# Patient Record
Sex: Female | Born: 1950
Health system: Southern US, Community
[De-identification: ages and names within clinical notes are randomized; demographics above are authoritative.]

## PROBLEM LIST (undated history)

## (undated) DIAGNOSIS — M1990 Unspecified osteoarthritis, unspecified site: Secondary | ICD-10-CM

## (undated) DIAGNOSIS — R112 Nausea with vomiting, unspecified: Secondary | ICD-10-CM

## (undated) DIAGNOSIS — I10 Essential (primary) hypertension: Secondary | ICD-10-CM

## (undated) DIAGNOSIS — E785 Hyperlipidemia, unspecified: Secondary | ICD-10-CM

## (undated) DIAGNOSIS — I071 Rheumatic tricuspid insufficiency: Secondary | ICD-10-CM

## (undated) DIAGNOSIS — N289 Disorder of kidney and ureter, unspecified: Secondary | ICD-10-CM

## (undated) DIAGNOSIS — I351 Nonrheumatic aortic (valve) insufficiency: Secondary | ICD-10-CM

## (undated) DIAGNOSIS — Z9889 Other specified postprocedural states: Secondary | ICD-10-CM

## (undated) DIAGNOSIS — M109 Gout, unspecified: Secondary | ICD-10-CM

## (undated) DIAGNOSIS — R05 Cough: Secondary | ICD-10-CM

## (undated) DIAGNOSIS — E669 Obesity, unspecified: Secondary | ICD-10-CM

## (undated) DIAGNOSIS — I2510 Atherosclerotic heart disease of native coronary artery without angina pectoris: Secondary | ICD-10-CM

## (undated) DIAGNOSIS — R059 Cough, unspecified: Secondary | ICD-10-CM

## (undated) DIAGNOSIS — I350 Nonrheumatic aortic (valve) stenosis: Secondary | ICD-10-CM

## (undated) DIAGNOSIS — N189 Chronic kidney disease, unspecified: Secondary | ICD-10-CM

## (undated) DIAGNOSIS — E119 Type 2 diabetes mellitus without complications: Secondary | ICD-10-CM

## (undated) HISTORY — DX: Hyperlipidemia, unspecified: E78.5

## (undated) HISTORY — PX: BACK SURGERY: SHX140

## (undated) HISTORY — DX: Type 2 diabetes mellitus without complications: E11.9

## (undated) HISTORY — DX: Nonrheumatic aortic (valve) insufficiency: I35.1

## (undated) HISTORY — DX: Rheumatic tricuspid insufficiency: I07.1

## (undated) HISTORY — PX: OTHER SURGICAL HISTORY: SHX169

## (undated) HISTORY — PX: FRACTURE SURGERY: SHX138

## (undated) HISTORY — DX: Essential (primary) hypertension: I10

## (undated) HISTORY — DX: Nonrheumatic aortic (valve) stenosis: I35.0

---

## 2003-03-01 ENCOUNTER — Ambulatory Visit (HOSPITAL_COMMUNITY): Admission: RE | Admit: 2003-03-01 | Discharge: 2003-03-01 | Payer: Self-pay | Admitting: Obstetrics & Gynecology

## 2005-06-29 ENCOUNTER — Ambulatory Visit (HOSPITAL_COMMUNITY): Admission: RE | Admit: 2005-06-29 | Discharge: 2005-06-29 | Payer: Self-pay | Admitting: Otolaryngology

## 2006-12-08 ENCOUNTER — Ambulatory Visit (HOSPITAL_COMMUNITY): Admission: RE | Admit: 2006-12-08 | Discharge: 2006-12-08 | Payer: Self-pay | Admitting: *Deleted

## 2008-01-24 ENCOUNTER — Ambulatory Visit (HOSPITAL_COMMUNITY): Admission: RE | Admit: 2008-01-24 | Discharge: 2008-01-24 | Payer: Self-pay | Admitting: *Deleted

## 2008-01-31 ENCOUNTER — Encounter (INDEPENDENT_AMBULATORY_CARE_PROVIDER_SITE_OTHER): Payer: Self-pay | Admitting: *Deleted

## 2008-01-31 ENCOUNTER — Ambulatory Visit (HOSPITAL_COMMUNITY): Admission: RE | Admit: 2008-01-31 | Discharge: 2008-01-31 | Payer: Self-pay | Admitting: *Deleted

## 2008-03-06 ENCOUNTER — Ambulatory Visit (HOSPITAL_COMMUNITY): Admission: RE | Admit: 2008-03-06 | Discharge: 2008-03-06 | Payer: Self-pay | Admitting: Family Medicine

## 2009-02-26 ENCOUNTER — Ambulatory Visit (HOSPITAL_COMMUNITY): Admission: RE | Admit: 2009-02-26 | Discharge: 2009-02-26 | Payer: Self-pay | Admitting: Internal Medicine

## 2010-02-09 ENCOUNTER — Ambulatory Visit (HOSPITAL_COMMUNITY): Admission: RE | Admit: 2010-02-09 | Discharge: 2010-02-09 | Payer: Self-pay | Admitting: Cardiology

## 2010-02-09 HISTORY — PX: CARDIAC CATHETERIZATION: SHX172

## 2010-02-10 ENCOUNTER — Ambulatory Visit: Payer: Self-pay | Admitting: Surgery

## 2010-02-13 ENCOUNTER — Ambulatory Visit (HOSPITAL_COMMUNITY): Admission: RE | Admit: 2010-02-13 | Discharge: 2010-02-14 | Payer: Self-pay | Admitting: Cardiology

## 2010-02-24 ENCOUNTER — Ambulatory Visit: Payer: Self-pay | Admitting: Surgery

## 2010-03-03 ENCOUNTER — Encounter: Admission: RE | Admit: 2010-03-03 | Discharge: 2010-03-03 | Payer: Self-pay | Admitting: Surgery

## 2010-03-03 ENCOUNTER — Ambulatory Visit: Payer: Self-pay | Admitting: Surgery

## 2010-03-30 ENCOUNTER — Ambulatory Visit: Payer: Self-pay | Admitting: Vascular Surgery

## 2010-04-01 ENCOUNTER — Ambulatory Visit: Payer: Self-pay | Admitting: Surgery

## 2010-04-01 ENCOUNTER — Inpatient Hospital Stay (HOSPITAL_COMMUNITY): Admission: RE | Admit: 2010-04-01 | Discharge: 2010-04-07 | Payer: Self-pay | Admitting: Surgery

## 2010-04-01 ENCOUNTER — Encounter: Payer: Self-pay | Admitting: Surgery

## 2010-04-01 HISTORY — PX: AORTIC VALVE REPLACEMENT: SHX41

## 2010-04-01 HISTORY — PX: OTHER SURGICAL HISTORY: SHX169

## 2010-04-21 ENCOUNTER — Ambulatory Visit: Payer: Self-pay | Admitting: Surgery

## 2010-04-21 ENCOUNTER — Encounter: Admission: RE | Admit: 2010-04-21 | Discharge: 2010-04-21 | Payer: Self-pay | Admitting: Surgery

## 2010-05-04 ENCOUNTER — Encounter (HOSPITAL_COMMUNITY): Admission: RE | Admit: 2010-05-04 | Discharge: 2010-06-03 | Payer: Self-pay | Admitting: Cardiology

## 2010-06-03 ENCOUNTER — Encounter (HOSPITAL_COMMUNITY)
Admission: RE | Admit: 2010-06-03 | Discharge: 2010-07-03 | Payer: Self-pay | Source: Home / Self Care | Admitting: Cardiology

## 2010-06-16 ENCOUNTER — Ambulatory Visit (HOSPITAL_COMMUNITY): Admission: RE | Admit: 2010-06-16 | Discharge: 2010-06-16 | Payer: Self-pay | Admitting: Cardiology

## 2010-07-03 ENCOUNTER — Encounter (HOSPITAL_COMMUNITY)
Admission: RE | Admit: 2010-07-03 | Discharge: 2010-08-02 | Payer: Self-pay | Source: Home / Self Care | Admitting: Cardiology

## 2010-07-07 ENCOUNTER — Ambulatory Visit: Payer: Self-pay | Admitting: Surgery

## 2010-08-03 ENCOUNTER — Encounter (HOSPITAL_COMMUNITY)
Admission: RE | Admit: 2010-08-03 | Discharge: 2010-09-02 | Payer: Self-pay | Source: Home / Self Care | Attending: Cardiology | Admitting: Cardiology

## 2010-09-01 ENCOUNTER — Encounter
Admission: RE | Admit: 2010-09-01 | Discharge: 2010-09-01 | Payer: Self-pay | Source: Home / Self Care | Attending: Surgery | Admitting: Surgery

## 2010-09-01 ENCOUNTER — Ambulatory Visit: Payer: Self-pay | Admitting: Surgery

## 2010-12-05 LAB — BASIC METABOLIC PANEL
BUN: 20 mg/dL (ref 6–23)
BUN: 21 mg/dL (ref 6–23)
BUN: 22 mg/dL (ref 6–23)
BUN: 24 mg/dL — ABNORMAL HIGH (ref 6–23)
CO2: 28 mEq/L (ref 19–32)
CO2: 31 mEq/L (ref 19–32)
CO2: 35 mEq/L — ABNORMAL HIGH (ref 19–32)
CO2: 36 mEq/L — ABNORMAL HIGH (ref 19–32)
Calcium: 8.3 mg/dL — ABNORMAL LOW (ref 8.4–10.5)
Calcium: 8.8 mg/dL (ref 8.4–10.5)
Calcium: 9.7 mg/dL (ref 8.4–10.5)
Calcium: 9.9 mg/dL (ref 8.4–10.5)
Chloride: 101 mEq/L (ref 96–112)
Chloride: 96 mEq/L (ref 96–112)
Chloride: 98 mEq/L (ref 96–112)
Chloride: 98 mEq/L (ref 96–112)
Creatinine, Ser: 1.45 mg/dL — ABNORMAL HIGH (ref 0.4–1.2)
Creatinine, Ser: 1.5 mg/dL — ABNORMAL HIGH (ref 0.4–1.2)
Creatinine, Ser: 1.52 mg/dL — ABNORMAL HIGH (ref 0.4–1.2)
Creatinine, Ser: 1.57 mg/dL — ABNORMAL HIGH (ref 0.4–1.2)
GFR calc Af Amer: 41 mL/min — ABNORMAL LOW (ref >60)
GFR calc Af Amer: 42 mL/min — ABNORMAL LOW (ref >60)
GFR calc Af Amer: 43 mL/min — ABNORMAL LOW (ref >60)
GFR calc Af Amer: 45 mL/min — ABNORMAL LOW (ref >60)
GFR calc non Af Amer: 34 mL/min — ABNORMAL LOW (ref >60)
GFR calc non Af Amer: 35 mL/min — ABNORMAL LOW (ref >60)
GFR calc non Af Amer: 35 mL/min — ABNORMAL LOW (ref >60)
GFR calc non Af Amer: 37 mL/min — ABNORMAL LOW (ref >60)
Glucose, Bld: 147 mg/dL — ABNORMAL HIGH (ref 70–99)
Glucose, Bld: 84 mg/dL (ref 70–99)
Glucose, Bld: 91 mg/dL (ref 70–99)
Glucose, Bld: 99 mg/dL (ref 70–99)
Potassium: 3.6 mEq/L (ref 3.5–5.1)
Potassium: 4.1 mEq/L (ref 3.5–5.1)
Potassium: 4.4 mEq/L (ref 3.5–5.1)
Potassium: 4.8 mEq/L (ref 3.5–5.1)
Sodium: 137 mEq/L (ref 135–145)
Sodium: 139 mEq/L (ref 135–145)
Sodium: 140 mEq/L (ref 135–145)
Sodium: 143 mEq/L (ref 135–145)

## 2010-12-05 LAB — GLUCOSE, CAPILLARY
Glucose-Capillary: 105 mg/dL — ABNORMAL HIGH (ref 70–99)
Glucose-Capillary: 110 mg/dL — ABNORMAL HIGH (ref 70–99)
Glucose-Capillary: 113 mg/dL — ABNORMAL HIGH (ref 70–99)
Glucose-Capillary: 116 mg/dL — ABNORMAL HIGH (ref 70–99)
Glucose-Capillary: 116 mg/dL — ABNORMAL HIGH (ref 70–99)
Glucose-Capillary: 117 mg/dL — ABNORMAL HIGH (ref 70–99)
Glucose-Capillary: 118 mg/dL — ABNORMAL HIGH (ref 70–99)
Glucose-Capillary: 128 mg/dL — ABNORMAL HIGH (ref 70–99)
Glucose-Capillary: 132 mg/dL — ABNORMAL HIGH (ref 70–99)
Glucose-Capillary: 139 mg/dL — ABNORMAL HIGH (ref 70–99)
Glucose-Capillary: 152 mg/dL — ABNORMAL HIGH (ref 70–99)
Glucose-Capillary: 156 mg/dL — ABNORMAL HIGH (ref 70–99)
Glucose-Capillary: 247 mg/dL — ABNORMAL HIGH (ref 70–99)
Glucose-Capillary: 88 mg/dL (ref 70–99)
Glucose-Capillary: 90 mg/dL (ref 70–99)
Glucose-Capillary: 91 mg/dL (ref 70–99)
Glucose-Capillary: 95 mg/dL (ref 70–99)
Glucose-Capillary: 96 mg/dL (ref 70–99)

## 2010-12-05 LAB — PROTIME-INR
INR: 1.14 (ref 0.00–1.49)
INR: 1.25 (ref 0.00–1.49)
INR: 1.28 (ref 0.00–1.49)
INR: 1.41 (ref 0.00–1.49)
INR: 1.62 — ABNORMAL HIGH (ref 0.00–1.49)
Prothrombin Time: 14.5 seconds (ref 11.6–15.2)
Prothrombin Time: 15.6 seconds — ABNORMAL HIGH (ref 11.6–15.2)
Prothrombin Time: 15.9 seconds — ABNORMAL HIGH (ref 11.6–15.2)
Prothrombin Time: 17.1 seconds — ABNORMAL HIGH (ref 11.6–15.2)
Prothrombin Time: 19.1 seconds — ABNORMAL HIGH (ref 11.6–15.2)

## 2010-12-05 LAB — CBC
HCT: 24.6 % — ABNORMAL LOW (ref 36.0–46.0)
HCT: 27.6 % — ABNORMAL LOW (ref 36.0–46.0)
Hemoglobin: 8.2 g/dL — ABNORMAL LOW (ref 12.0–15.0)
Hemoglobin: 9.1 g/dL — ABNORMAL LOW (ref 12.0–15.0)
MCH: 28.9 pg (ref 26.0–34.0)
MCH: 29.1 pg (ref 26.0–34.0)
MCHC: 32.9 g/dL (ref 30.0–36.0)
MCHC: 33.2 g/dL (ref 30.0–36.0)
MCV: 87.6 fL (ref 78.0–100.0)
MCV: 87.7 fL (ref 78.0–100.0)
Platelets: 172 10*3/uL (ref 150–400)
Platelets: 237 10*3/uL (ref 150–400)
RBC: 2.81 MIL/uL — ABNORMAL LOW (ref 3.87–5.11)
RBC: 3.15 MIL/uL — ABNORMAL LOW (ref 3.87–5.11)
RDW: 13.7 % (ref 11.5–15.5)
RDW: 14 % (ref 11.5–15.5)
WBC: 16 10*3/uL — ABNORMAL HIGH (ref 4.0–10.5)
WBC: 18.3 10*3/uL — ABNORMAL HIGH (ref 4.0–10.5)

## 2010-12-06 LAB — POCT I-STAT 3, ART BLOOD GAS (G3+)
Acid-base deficit: 1 mmol/L (ref 0.0–2.0)
Acid-base deficit: 2 mmol/L (ref 0.0–2.0)
Acid-base deficit: 5 mmol/L — ABNORMAL HIGH (ref 0.0–2.0)
Acid-base deficit: 7 mmol/L — ABNORMAL HIGH (ref 0.0–2.0)
Bicarbonate: 19.8 mEq/L — ABNORMAL LOW (ref 20.0–24.0)
Bicarbonate: 20.9 mEq/L (ref 20.0–24.0)
Bicarbonate: 23.2 mEq/L (ref 20.0–24.0)
Bicarbonate: 23.6 mEq/L (ref 20.0–24.0)
O2 Saturation: 100 %
O2 Saturation: 100 %
O2 Saturation: 94 %
O2 Saturation: 94 %
Patient temperature: 35.5
Patient temperature: 36.5
TCO2: 21 mmol/L (ref 0–100)
TCO2: 22 mmol/L (ref 0–100)
TCO2: 24 mmol/L (ref 0–100)
TCO2: 25 mmol/L (ref 0–100)
pCO2 arterial: 35 mmHg (ref 35.0–45.0)
pCO2 arterial: 40.3 mmHg (ref 35.0–45.0)
pCO2 arterial: 40.8 mmHg (ref 35.0–45.0)
pCO2 arterial: 44.3 mmHg (ref 35.0–45.0)
pH, Arterial: 7.287 — ABNORMAL LOW (ref 7.350–7.400)
pH, Arterial: 7.322 — ABNORMAL LOW (ref 7.350–7.400)
pH, Arterial: 7.335 — ABNORMAL LOW (ref 7.350–7.400)
pH, Arterial: 7.428 — ABNORMAL HIGH (ref 7.350–7.400)
pO2, Arterial: 359 mmHg — ABNORMAL HIGH (ref 80.0–100.0)
pO2, Arterial: 431 mmHg — ABNORMAL HIGH (ref 80.0–100.0)
pO2, Arterial: 74 mmHg — ABNORMAL LOW (ref 80.0–100.0)
pO2, Arterial: 76 mmHg — ABNORMAL LOW (ref 80.0–100.0)

## 2010-12-06 LAB — POCT I-STAT 4, (NA,K, GLUC, HGB,HCT)
Glucose, Bld: 128 mg/dL — ABNORMAL HIGH (ref 70–99)
Glucose, Bld: 129 mg/dL — ABNORMAL HIGH (ref 70–99)
Glucose, Bld: 129 mg/dL — ABNORMAL HIGH (ref 70–99)
Glucose, Bld: 138 mg/dL — ABNORMAL HIGH (ref 70–99)
Glucose, Bld: 150 mg/dL — ABNORMAL HIGH (ref 70–99)
Glucose, Bld: 164 mg/dL — ABNORMAL HIGH (ref 70–99)
Glucose, Bld: 171 mg/dL — ABNORMAL HIGH (ref 70–99)
Glucose, Bld: 223 mg/dL — ABNORMAL HIGH (ref 70–99)
HCT: 22 % — ABNORMAL LOW (ref 36.0–46.0)
HCT: 24 % — ABNORMAL LOW (ref 36.0–46.0)
HCT: 24 % — ABNORMAL LOW (ref 36.0–46.0)
HCT: 25 % — ABNORMAL LOW (ref 36.0–46.0)
HCT: 26 % — ABNORMAL LOW (ref 36.0–46.0)
HCT: 29 % — ABNORMAL LOW (ref 36.0–46.0)
HCT: 31 % — ABNORMAL LOW (ref 36.0–46.0)
HCT: 32 % — ABNORMAL LOW (ref 36.0–46.0)
Hemoglobin: 10.5 g/dL — ABNORMAL LOW (ref 12.0–15.0)
Hemoglobin: 10.9 g/dL — ABNORMAL LOW (ref 12.0–15.0)
Hemoglobin: 7.5 g/dL — ABNORMAL LOW (ref 12.0–15.0)
Hemoglobin: 8.2 g/dL — ABNORMAL LOW (ref 12.0–15.0)
Hemoglobin: 8.2 g/dL — ABNORMAL LOW (ref 12.0–15.0)
Hemoglobin: 8.5 g/dL — ABNORMAL LOW (ref 12.0–15.0)
Hemoglobin: 8.8 g/dL — ABNORMAL LOW (ref 12.0–15.0)
Hemoglobin: 9.9 g/dL — ABNORMAL LOW (ref 12.0–15.0)
Potassium: 3.6 mEq/L (ref 3.5–5.1)
Potassium: 3.6 mEq/L (ref 3.5–5.1)
Potassium: 4 mEq/L (ref 3.5–5.1)
Potassium: 4.1 mEq/L (ref 3.5–5.1)
Potassium: 4.5 mEq/L (ref 3.5–5.1)
Potassium: 4.6 mEq/L (ref 3.5–5.1)
Potassium: 4.7 mEq/L (ref 3.5–5.1)
Potassium: 4.8 mEq/L (ref 3.5–5.1)
Sodium: 136 mEq/L (ref 135–145)
Sodium: 137 mEq/L (ref 135–145)
Sodium: 138 mEq/L (ref 135–145)
Sodium: 138 mEq/L (ref 135–145)
Sodium: 139 mEq/L (ref 135–145)
Sodium: 139 mEq/L (ref 135–145)
Sodium: 141 mEq/L (ref 135–145)
Sodium: 142 mEq/L (ref 135–145)

## 2010-12-06 LAB — GLUCOSE, CAPILLARY
Glucose-Capillary: 104 mg/dL — ABNORMAL HIGH (ref 70–99)
Glucose-Capillary: 122 mg/dL — ABNORMAL HIGH (ref 70–99)
Glucose-Capillary: 131 mg/dL — ABNORMAL HIGH (ref 70–99)
Glucose-Capillary: 138 mg/dL — ABNORMAL HIGH (ref 70–99)
Glucose-Capillary: 140 mg/dL — ABNORMAL HIGH (ref 70–99)
Glucose-Capillary: 140 mg/dL — ABNORMAL HIGH (ref 70–99)
Glucose-Capillary: 143 mg/dL — ABNORMAL HIGH (ref 70–99)
Glucose-Capillary: 147 mg/dL — ABNORMAL HIGH (ref 70–99)
Glucose-Capillary: 147 mg/dL — ABNORMAL HIGH (ref 70–99)
Glucose-Capillary: 148 mg/dL — ABNORMAL HIGH (ref 70–99)
Glucose-Capillary: 149 mg/dL — ABNORMAL HIGH (ref 70–99)
Glucose-Capillary: 165 mg/dL — ABNORMAL HIGH (ref 70–99)
Glucose-Capillary: 172 mg/dL — ABNORMAL HIGH (ref 70–99)
Glucose-Capillary: 178 mg/dL — ABNORMAL HIGH (ref 70–99)
Glucose-Capillary: 181 mg/dL — ABNORMAL HIGH (ref 70–99)
Glucose-Capillary: 237 mg/dL — ABNORMAL HIGH (ref 70–99)
Glucose-Capillary: 238 mg/dL — ABNORMAL HIGH (ref 70–99)
Glucose-Capillary: 319 mg/dL — ABNORMAL HIGH (ref 70–99)
Glucose-Capillary: 80 mg/dL (ref 70–99)
Glucose-Capillary: 96 mg/dL (ref 70–99)

## 2010-12-06 LAB — PLATELET COUNT: Platelets: 204 10*3/uL (ref 150–400)

## 2010-12-06 LAB — COMPREHENSIVE METABOLIC PANEL
ALT: 20 U/L (ref 0–35)
AST: 27 U/L (ref 0–37)
Albumin: 3.7 g/dL (ref 3.5–5.2)
Alkaline Phosphatase: 34 U/L — ABNORMAL LOW (ref 39–117)
BUN: 21 mg/dL (ref 6–23)
CO2: 22 mEq/L (ref 19–32)
Calcium: 10.4 mg/dL (ref 8.4–10.5)
Chloride: 106 mEq/L (ref 96–112)
Creatinine, Ser: 1.36 mg/dL — ABNORMAL HIGH (ref 0.4–1.2)
GFR calc Af Amer: 48 mL/min — ABNORMAL LOW (ref >60)
GFR calc non Af Amer: 40 mL/min — ABNORMAL LOW (ref >60)
Glucose, Bld: 134 mg/dL — ABNORMAL HIGH (ref 70–99)
Potassium: 4.9 mEq/L (ref 3.5–5.1)
Sodium: 138 mEq/L (ref 135–145)
Total Bilirubin: 0.4 mg/dL (ref 0.3–1.2)
Total Protein: 6.9 g/dL (ref 6.0–8.3)

## 2010-12-06 LAB — CBC
HCT: 25.2 % — ABNORMAL LOW (ref 36.0–46.0)
HCT: 26.5 % — ABNORMAL LOW (ref 36.0–46.0)
HCT: 27 % — ABNORMAL LOW (ref 36.0–46.0)
HCT: 29.6 % — ABNORMAL LOW (ref 36.0–46.0)
HCT: 37.2 % (ref 36.0–46.0)
Hemoglobin: 12.5 g/dL (ref 12.0–15.0)
Hemoglobin: 8.4 g/dL — ABNORMAL LOW (ref 12.0–15.0)
Hemoglobin: 8.7 g/dL — ABNORMAL LOW (ref 12.0–15.0)
Hemoglobin: 9 g/dL — ABNORMAL LOW (ref 12.0–15.0)
Hemoglobin: 9.7 g/dL — ABNORMAL LOW (ref 12.0–15.0)
MCH: 28.6 pg (ref 26.0–34.0)
MCH: 28.7 pg (ref 26.0–34.0)
MCH: 28.8 pg (ref 26.0–34.0)
MCH: 29 pg (ref 26.0–34.0)
MCH: 29 pg (ref 26.0–34.0)
MCHC: 32.9 g/dL (ref 30.0–36.0)
MCHC: 32.9 g/dL (ref 30.0–36.0)
MCHC: 33.1 g/dL (ref 30.0–36.0)
MCHC: 33.5 g/dL (ref 30.0–36.0)
MCHC: 33.5 g/dL (ref 30.0–36.0)
MCV: 86.4 fL (ref 78.0–100.0)
MCV: 86.5 fL (ref 78.0–100.0)
MCV: 86.9 fL (ref 78.0–100.0)
MCV: 87 fL (ref 78.0–100.0)
MCV: 87.3 fL (ref 78.0–100.0)
Platelets: 173 10*3/uL (ref 150–400)
Platelets: 188 10*3/uL (ref 150–400)
Platelets: 211 10*3/uL (ref 150–400)
Platelets: 241 10*3/uL (ref 150–400)
Platelets: 316 10*3/uL (ref 150–400)
RBC: 2.9 MIL/uL — ABNORMAL LOW (ref 3.87–5.11)
RBC: 3.05 MIL/uL — ABNORMAL LOW (ref 3.87–5.11)
RBC: 3.12 MIL/uL — ABNORMAL LOW (ref 3.87–5.11)
RBC: 3.39 MIL/uL — ABNORMAL LOW (ref 3.87–5.11)
RBC: 4.3 MIL/uL (ref 3.87–5.11)
RDW: 13.5 % (ref 11.5–15.5)
RDW: 13.8 % (ref 11.5–15.5)
RDW: 13.8 % (ref 11.5–15.5)
RDW: 13.9 % (ref 11.5–15.5)
RDW: 13.9 % (ref 11.5–15.5)
WBC: 11.2 10*3/uL — ABNORMAL HIGH (ref 4.0–10.5)
WBC: 16 10*3/uL — ABNORMAL HIGH (ref 4.0–10.5)
WBC: 17.3 10*3/uL — ABNORMAL HIGH (ref 4.0–10.5)
WBC: 18.5 10*3/uL — ABNORMAL HIGH (ref 4.0–10.5)
WBC: 9.9 10*3/uL (ref 4.0–10.5)

## 2010-12-06 LAB — BASIC METABOLIC PANEL
BUN: 18 mg/dL (ref 6–23)
CO2: 24 mEq/L (ref 19–32)
Calcium: 8 mg/dL — ABNORMAL LOW (ref 8.4–10.5)
Chloride: 107 mEq/L (ref 96–112)
Creatinine, Ser: 1.37 mg/dL — ABNORMAL HIGH (ref 0.4–1.2)
GFR calc Af Amer: 48 mL/min — ABNORMAL LOW (ref >60)
GFR calc non Af Amer: 39 mL/min — ABNORMAL LOW (ref >60)
Glucose, Bld: 130 mg/dL — ABNORMAL HIGH (ref 70–99)
Potassium: 4.7 mEq/L (ref 3.5–5.1)
Sodium: 140 mEq/L (ref 135–145)

## 2010-12-06 LAB — HEMOGLOBIN AND HEMATOCRIT, BLOOD
HCT: 23.7 % — ABNORMAL LOW (ref 36.0–46.0)
Hemoglobin: 8.1 g/dL — ABNORMAL LOW (ref 12.0–15.0)

## 2010-12-06 LAB — PROTIME-INR
INR: 0.97 (ref 0.00–1.49)
INR: 1.39 (ref 0.00–1.49)
Prothrombin Time: 12.8 seconds (ref 11.6–15.2)
Prothrombin Time: 16.9 seconds — ABNORMAL HIGH (ref 11.6–15.2)

## 2010-12-06 LAB — URINE MICROSCOPIC-ADD ON

## 2010-12-06 LAB — POCT I-STAT, CHEM 8
BUN: 18 mg/dL (ref 6–23)
BUN: 22 mg/dL (ref 6–23)
Calcium, Ion: 1.11 mmol/L — ABNORMAL LOW (ref 1.12–1.32)
Calcium, Ion: 1.18 mmol/L (ref 1.12–1.32)
Chloride: 101 mEq/L (ref 96–112)
Chloride: 111 mEq/L (ref 96–112)
Creatinine, Ser: 1.3 mg/dL — ABNORMAL HIGH (ref 0.4–1.2)
Creatinine, Ser: 1.5 mg/dL — ABNORMAL HIGH (ref 0.4–1.2)
Glucose, Bld: 146 mg/dL — ABNORMAL HIGH (ref 70–99)
Glucose, Bld: 261 mg/dL — ABNORMAL HIGH (ref 70–99)
HCT: 26 % — ABNORMAL LOW (ref 36.0–46.0)
HCT: 28 % — ABNORMAL LOW (ref 36.0–46.0)
Hemoglobin: 8.8 g/dL — ABNORMAL LOW (ref 12.0–15.0)
Hemoglobin: 9.5 g/dL — ABNORMAL LOW (ref 12.0–15.0)
Potassium: 3.3 mEq/L — ABNORMAL LOW (ref 3.5–5.1)
Potassium: 4.4 mEq/L (ref 3.5–5.1)
Sodium: 136 mEq/L (ref 135–145)
Sodium: 140 mEq/L (ref 135–145)
TCO2: 20 mmol/L (ref 0–100)
TCO2: 24 mmol/L (ref 0–100)

## 2010-12-06 LAB — URINALYSIS, ROUTINE W REFLEX MICROSCOPIC
Bilirubin Urine: NEGATIVE
Glucose, UA: NEGATIVE mg/dL
Hgb urine dipstick: NEGATIVE
Ketones, ur: NEGATIVE mg/dL
Nitrite: NEGATIVE
Protein, ur: NEGATIVE mg/dL
Specific Gravity, Urine: 1.011 (ref 1.005–1.030)
Urobilinogen, UA: 0.2 mg/dL (ref 0.0–1.0)
pH: 6.5 (ref 5.0–8.0)

## 2010-12-06 LAB — MAGNESIUM
Magnesium: 2.3 mg/dL (ref 1.5–2.5)
Magnesium: 2.4 mg/dL (ref 1.5–2.5)
Magnesium: 2.8 mg/dL — ABNORMAL HIGH (ref 1.5–2.5)

## 2010-12-06 LAB — BLOOD GAS, ARTERIAL
Acid-base deficit: 0.8 mmol/L (ref 0.0–2.0)
Bicarbonate: 23.1 mEq/L (ref 20.0–24.0)
Drawn by: 181601
FIO2: 0.21 %
O2 Saturation: 99.5 %
Patient temperature: 98.6
TCO2: 24.2 mmol/L (ref 0–100)
pCO2 arterial: 36.5 mmHg (ref 35.0–45.0)
pH, Arterial: 7.417 — ABNORMAL HIGH (ref 7.350–7.400)
pO2, Arterial: 102 mmHg — ABNORMAL HIGH (ref 80.0–100.0)

## 2010-12-06 LAB — TYPE AND SCREEN
ABO/RH(D): A POS
Antibody Screen: NEGATIVE

## 2010-12-06 LAB — SURGICAL PCR SCREEN
MRSA, PCR: NEGATIVE
Staphylococcus aureus: NEGATIVE

## 2010-12-06 LAB — CREATININE, SERUM
Creatinine, Ser: 1.46 mg/dL — ABNORMAL HIGH (ref 0.4–1.2)
Creatinine, Ser: 1.57 mg/dL — ABNORMAL HIGH (ref 0.4–1.2)
GFR calc Af Amer: 41 mL/min — ABNORMAL LOW (ref >60)
GFR calc Af Amer: 44 mL/min — ABNORMAL LOW (ref >60)
GFR calc non Af Amer: 34 mL/min — ABNORMAL LOW (ref >60)
GFR calc non Af Amer: 37 mL/min — ABNORMAL LOW (ref >60)

## 2010-12-06 LAB — ABO/RH: ABO/RH(D): A POS

## 2010-12-06 LAB — HEMOGLOBIN A1C
Hgb A1c MFr Bld: 6.5 % — ABNORMAL HIGH (ref <5.7)
Mean Plasma Glucose: 140 mg/dL — ABNORMAL HIGH (ref <117)

## 2010-12-06 LAB — APTT
aPTT: 28 seconds (ref 24–37)
aPTT: 29 seconds (ref 24–37)

## 2010-12-07 LAB — CBC
HCT: 27.2 % — ABNORMAL LOW (ref 36.0–46.0)
Hemoglobin: 8.9 g/dL — ABNORMAL LOW (ref 12.0–15.0)
MCHC: 32.9 g/dL (ref 30.0–36.0)
MCV: 90.7 fL (ref 78.0–100.0)
Platelets: 323 10*3/uL (ref 150–400)
RBC: 2.99 MIL/uL — ABNORMAL LOW (ref 3.87–5.11)
RDW: 13.4 % (ref 11.5–15.5)
WBC: 7.7 10*3/uL (ref 4.0–10.5)

## 2010-12-07 LAB — POCT I-STAT 3, ART BLOOD GAS (G3+)
Acid-base deficit: 2 mmol/L (ref 0.0–2.0)
Bicarbonate: 22.4 mEq/L (ref 20.0–24.0)
O2 Saturation: 95 %
TCO2: 24 mmol/L (ref 0–100)
pCO2 arterial: 38.2 mmHg (ref 35.0–45.0)
pH, Arterial: 7.377 (ref 7.350–7.400)
pO2, Arterial: 77 mmHg — ABNORMAL LOW (ref 80.0–100.0)

## 2010-12-07 LAB — POCT I-STAT 3, VENOUS BLOOD GAS (G3P V)
Acid-base deficit: 4 mmol/L — ABNORMAL HIGH (ref 0.0–2.0)
Bicarbonate: 21.8 mEq/L (ref 20.0–24.0)
O2 Saturation: 62 %
TCO2: 23 mmol/L (ref 0–100)
pCO2, Ven: 41.1 mmHg — ABNORMAL LOW (ref 45.0–50.0)
pH, Ven: 7.333 — ABNORMAL HIGH (ref 7.250–7.300)
pO2, Ven: 34 mmHg (ref 30.0–45.0)

## 2010-12-07 LAB — BASIC METABOLIC PANEL
BUN: 18 mg/dL (ref 6–23)
CO2: 23 mEq/L (ref 19–32)
Calcium: 9.3 mg/dL (ref 8.4–10.5)
Chloride: 106 mEq/L (ref 96–112)
Creatinine, Ser: 1.27 mg/dL — ABNORMAL HIGH (ref 0.4–1.2)
GFR calc Af Amer: 52 mL/min — ABNORMAL LOW (ref >60)
GFR calc non Af Amer: 43 mL/min — ABNORMAL LOW (ref >60)
Glucose, Bld: 115 mg/dL — ABNORMAL HIGH (ref 70–99)
Potassium: 3.9 mEq/L (ref 3.5–5.1)
Sodium: 138 mEq/L (ref 135–145)

## 2010-12-07 LAB — GLUCOSE, CAPILLARY
Glucose-Capillary: 121 mg/dL — ABNORMAL HIGH (ref 70–99)
Glucose-Capillary: 127 mg/dL — ABNORMAL HIGH (ref 70–99)
Glucose-Capillary: 133 mg/dL — ABNORMAL HIGH (ref 70–99)
Glucose-Capillary: 141 mg/dL — ABNORMAL HIGH (ref 70–99)
Glucose-Capillary: 162 mg/dL — ABNORMAL HIGH (ref 70–99)
Glucose-Capillary: 298 mg/dL — ABNORMAL HIGH (ref 70–99)

## 2011-01-21 ENCOUNTER — Other Ambulatory Visit: Payer: Self-pay | Admitting: Surgery

## 2011-01-21 DIAGNOSIS — R911 Solitary pulmonary nodule: Secondary | ICD-10-CM

## 2011-02-02 NOTE — Assessment & Plan Note (Signed)
OFFICE VISIT   [name], [name]  DOB:  03/25/1951                                        September 01, 2010  CHART #:  [id number]   HISTORY:  The patient returned to my office today for followup of a 9-mm  right upper lobe lung nodule that was initially diagnosed on  preoperative CT scan.  She had undergone aortic valve replacement with a  St. Jude mechanical valve and replacement of an ascending aortic  aneurysm with a supra coronary tube graft on April 01, 2010.  A  preoperative CT scan of the chest was performed to assess the size of  her aorta and this has shown the 9-mm right upper lobe lung nodule.  Her  postoperative course from a cardiac surgery standpoint has been a little  slow, but she has gradually improved.  Her only complaints at this time  are some paresthesias around the incision probably related to keloid  formation in the upper portion of the incision.  She said that this is  gradually improving.  She denies any shortness of breath and has been  walking well.  She is using some local remedies for her keloid.   PHYSICAL EXAMINATION:  Today, her blood pressure 123/74, pulse is 72 and  regular, and respiratory rate is 18, unlabored.  Oxygen saturation on  room air is 98%.  She looks well.  Cardiac exam shows regular rate and  rhythm with crisp mechanical valve sounds.  Lung exam is clear.  The  chest incision is well healed.  The upper portion of the incision has  some keloid formation, but the mid and lower portions have a very fine  scar.  There is no erythema.  The sternum feels stable.  There is no  peripheral edema.   DIAGNOSTIC TESTS:  A CT scan of the chest was done today and shows a  stable 9-mm right upper lobe lung nodule bordering the major fissure.  The radiologist also commented on a few other small nodules which were  not commented on on the previous CT scan.  This probably shows a stable  3-mm nodule in the right lung  apex as well as a stable 2.5-mm nodule in  the anterior lingula and a stable 5-mm nodule in the left posterior  sulcus.   IMPRESSION:  The patient has a stable 9-mm right upper lobe lung nodule  as well as a few other smaller stable nodules.  I suspect these are  probably benign, but we will see her back in 6 months with a repeat CT  scan of the chest to be sure there has been no change.  She will  continue to follow up with [facility] for her cardiology  care.  She said that she recently was signed to a new cardiologist after  her cardiologist left the practice, but she did not remember what his  name was.  I will plan to see her back in 6 months with a CT scan of the  chest.   Gilford Raid, M.D.  Electronically Signed   BB/MEDQ  D:  09/01/2010  T:  09/02/2010  Job:  [id number]   cc:   [facility]

## 2011-02-02 NOTE — Assessment & Plan Note (Signed)
OFFICE VISIT   [name], [name]  DOB:  09/01/1951                                        April 21, 2010  CHART #:  [id number]   HISTORY:  The patient returned to my office today for followup status  post aortic valve replacement with a 21-mm St. Jude [employer] mechanical  valve and replacement of an ascending aortic aneurysm using a 28-mm  supracoronary tube graft.  [name] said that [name] feels like [name] is improving  overall and is walking daily without chest pain or shortness of breath.  [name] has mild chest wall soreness.  [name] does report some numbness and  tingling in the right fourth and fifth fingers since surgery, but it  seems to be improving.  [name] also notes 2 episodes of brief visual  disturbance occurring over the past week.  [name] said [name] was watching TV  and her computer screen and all of a sudden  noticed that [name] could no  longer focus on the screens and see them clearly.  [name] did not notice  any headache.  [name] had no peripheral motor weakness or numbness at this  time.  [name] had no dysarthria.  [name] said the symptoms have resolved after  a few minutes.   [name] is on Coumadin for her mechanical valve and has been  having her INR followed up by [facility].  [name] said that  her last level was 2.4 and [name] was having the dose increased further.   PHYSICAL EXAMINATION:  Vital Signs:  Today, her blood pressure is  112/67, pulse is 87 and regular, respiratory rate is 18, unlabored.  Oxygen saturation on room air is 98%.  [name] still is a little tired but  better than [name] did preoperatively.  Cardiac:  Regular rate and rhythm  with crisp mechanical valve click.  There is no murmur.  Lungs:  Clear.  Chest:  Incision is healing well and sternum is stable.  There is  moderate bilateral ankle and pedal edema, which [name] said it has  decreased from where it was.   Followup chest x-ray today shows clear lung fields and no pleural  effusions.   There is cardiomegaly.   MEDICATIONS:  Metoprolol 25 mg b.i.d., Crestor 20 mg at bedtime,  zolpidem 10 mg at bedtime, TriCor 145 mg daily, vitamin B complex daily,  multivitamin daily, aspirin 81 mg daily, glipizide 5 mg daily, Coumadin  as directed, iron 150 mg b.i.d., Xanax 0.5 mg b.i.d., Lasix 20 mg daily,  stool softener daily, and hydrocodone p.r.n. for pain.   IMPRESSION:  Overall, the patient seems to be making fairly good  recovery following her surgery.  His 2 episodes of visual changes do  concern me, although, I am not sure what the etiology is.  I am always  concerned about possibility of some embolic phenomenon.  Her INR has  been within the therapeutic range.  Her Coumadin dose has actually  increased recently.  I will continue to watch the symptoms closely and  if they recur anymore, then [name] may need echocardiogram to further  evaluate her valve and to rule out intracardiac thrombus.  [name] is  planning on participating in cardiac rehab and feels like [name] is ready  to start.  I told her to avoid lifting anything heavier than 10 pounds  for a total of 3 months from date of surgery.  [name] does not drive so  that it will not be an issue.  [name] still has some lower extremity edema,  and I answered to increase her Lasix to 40 mg per day see if this  decreases the edema.  [name] will continue to follow up with Dr. Felton Clinton  for her cardiology care and will contact me if [name] develops any problems  with her incisions or any further visual disturbance.   Gilford Raid, M.D.  Electronically Signed   BB/MEDQ  D:  04/21/2010  T:  04/22/2010  Job:  [id number]   cc:   Quincy Carnes, MD  Sherrilee Gilles. Gerarda Fraction, MD

## 2011-02-02 NOTE — Consult Note (Signed)
NEW PATIENT CONSULTATION   REDIA, [name]  DOB:  Feb 20, 1951                                        Feb 12, 2010  CHART #:  [id number]   REASON FOR CONSULTATION:  Severe aortic stenosis.   CLINICAL HISTORY:  I asked to Dr. Felton Clinton to evaluate the patient for  consideration of aortic valve replacement.  She is a 60 year old woman  with a history of severe aortic stenosis and moderate aortic  insufficiency as well as moderate tricuspid regurgitation, last followed  up by echocardiogram in June 2010.  Her mean gradient at that time was  44, the peak gradient was 74.  She had preserved left ventricular  function and normal right ventricular systolic pressure estimated at 23  mmHg.  She was seen in January 2011 and denied any symptoms.  Discussion  was held with her about the need for aortic valve replacement.  She was  counseled to contact Cardiology immediately if she develops any symptoms  of aortic stenosis.  She was seen again on Feb 02, 2010, reporting  increased shortness of breath for about 2 weeks.  She said this came on  fairly quickly and has been associated with marked fatigue and some  episodes of lightheadedness.  Her aortic valve area was 0.66 sq. cm by  echocardiogram in June 2010.   REVIEW OF SYSTEMS:  Her review of systems is as follows.  GENERAL:  She denies any fever or chills.  She has had no recent weight  changes.  She does report marked fatigue over the past few weeks.  EYES:  Negative.  ENT:  She has seen a dentist regularly and was referred to an  endodontist, Dr. Tarri Glenn where she underwent a root canal on the tooth.  She said she has had persistent pain in that tooth since the root canal  and was told that she may have persistent infection that may require  further treatment.  She has not been back to see the endodontist yet.  She does report continued pain from this tooth.  ENDOCRINE:  She denies hypothyroidism.  She does have  history of  diabetes.  CARDIOVASCULAR:  She denies any chest pain or pressure.  She has had  exertional dyspnea as well as orthopnea.  She denies PND.  She has had  no palpitations.  She has had some peripheral edema in her ankles.  RESPIRATORY:  She denies cough and sputum production.  GI:  She has had no nausea or vomiting.  She denies melena and bright  red blood per rectum.  GU:  She denies dysuria and hematuria.  MUSCULOSKELETAL:  She denies arthralgias and myalgias.  NEUROLOGICAL:  She denies any focal weakness or numbness.  She has had  some dizziness, but no syncope.  She never had TIA or stroke.  PSYCHIATRIC:  Negative.  HEMATOLOGICAL:  Negative.  She has no history of bleeding disorders or  easy bleeding.   ALLERGIES:  None.   PAST MEDICAL HISTORY:  Significant for diabetes since age 62.  She has a  history of hypertension and hyperlipidemia.   MEDICATIONS:  1. Zolpidem 10 mg nightly.  2. Metformin 500 mg b.i.d.  3. Lopressor 25 mg b.i.d.  4. Triamterene/HCTZ 75/50 daily.  5. Lisinopril 10 mg daily.  6. TriCor 145 mg daily.  7. Crestor 20 mg  at bedtime.  8. Aspirin 81 mg daily.  9. Iron 27 mg daily.  10.Multivitamin daily.  11.Vitamin super B-complex once a day.  12.Aller-Tec 10 mg p.r.n. daily.   FAMILY HISTORY:  Positive for cardiac disease.  Her father had heart  attack in his late 33s.  Brother had bypass surgery at age 62.  There is  no history of aortic stenosis.   SOCIAL HISTORY:  She is married and works as a Designer, jewellery.  She is a nonsmoker and denies alcohol use.   PHYSICAL EXAMINATION:  Vital Signs:  Blood pressure is 146/73, pulse 75  and regular, and respiratory rate is 18, unlabored.  General:  She is an  obese white female, in no distress.  HEENT:  Normocephalic and  atraumatic.  Pupils are equal and reactive to light and accommodation.  Extraocular muscles are intact.  Her throat is clear.  Neck:  Normal  carotid pulses  bilaterally.  There are no bruits.  There is no  adenopathy or thyromegaly.  Cardiac:  Regular rate and rhythm with  normal S1 and a soft S2.  There is a grade 3/6 systolic murmur along the  left sternal border.  Lungs:  Clear.  Abdomen:  Active bowel sounds.  Abdomen is soft, obese, and nontender.  There are no palpable masses or  organomegaly.  Extremities:  No peripheral edema.  Pedal pulses are  palpable bilaterally.  Skin:  Warm and dry.  Neurologic:  Alert and  oriented x3.  Motor and sensory exams grossly normal.   IMPRESSION:  The patient has severe aortic stenosis and moderate aortic  insufficiency by previous echocardiogram.  She reports being  asymptomatic until the past few weeks when she developed sudden onset of  shortness of breath and fatigue.  I agree that it is time to proceed  with aortic valve replacement.  She is scheduled for cardiac  catheterization in the next week or so to evaluate her coronary  circulation.  She also has a tooth that is still causing her pain after  root canal and may well be involved in persistent infection.  I think  she will need to be seen by her endodontist again immediately to  evaluate this and treat any infection prior to proceeding with valve  replacement.  I discussed the importance of this with her and  possibility that she could develop prosthetic valve endocarditis  postoperatively if she does not have taking care of.  She will make that  appointment immediately.  I will plan to see her back in the office  after she has her cardiac catheterization to discuss the results with  her and make surgical plans.   Gilford Raid, M.D.  Electronically Signed   BB/MEDQ  D:  02/12/2010  T:  02/13/2010  Job:  [id number]   cc:   Quincy Carnes, MD  Sherrilee Gilles. Gerarda Fraction, MD

## 2011-02-02 NOTE — Op Note (Signed)
NAME:  [name], [name]         ACCOUNT NO.:  [id number]   MEDICAL RECORD NO.:  [id number]          PATIENT TYPE:  AMB   LOCATION:  ENDO                         FACILITY:  [facility]   PHYSICIAN:  Leslye Peer, MD       DATE OF BIRTH:  [birth year]   DATE OF PROCEDURE:  01/31/2008  DATE OF DISCHARGE:                               OPERATIVE REPORT   PROCEDURE:  Transesophageal echocardiogram.   INDICATIONS:  Aortic stenosis.   OPERATOR:  Leslye Peer, MD   COMPLICATIONS:  None immediate.   PROCEDURE:  The patient was sedated with 5 mg of Versed IV and 25 mcg of  fentanyl IV and intubated without difficulty.   RESULTS:  1. Left atrium is normal in size.  2. Left atrial appendage is also normal in size without clot or      thrombus noted.  She had normal atrial velocities in the atrial      appendage.  3. Right atrium is normal in size.  4. Right ventricle is normal in size and function.  5. Left ventricle is normal in size and function without regional wall      motion abnormality noted.  6. Mitral valve contractility is normal but has trivial to mild mitral      regurgitation.  7. Tricuspid valve essentially normal with trace to mild tricuspid      regurgitation.  8. Pulmonic valve was not well seen.  9. All pulmonary arteries are normal in size.  10.Aortic valve was heavily calcified but probably trileaflet.  There      was severe reduction in leaflet excursion and opening.  11.Aortic valve area on last telemetry was found to be 1.13 cm.  12.__________ consistent with moderate-to-severe __________  13.Aortic root was normal in size while there was mild dilatation in      ascending aorta consistent with post dilatation.  14.The distal ascending aortic arch and descending aorta wall are      normal in size __________  noted.           ______________________________  Leslye Peer, MD     AB/MEDQ  D:  01/31/2008  T:  02/01/2008  Job:  [id number]

## 2011-02-02 NOTE — Assessment & Plan Note (Signed)
OFFICE VISIT   [name], [name]  DOB:  1951-09-19                                        March 04, 2010  CHART #:  [id number]   HISTORY:  The patient returns to my office today for follow up of her  severe aortic stenosis and moderate aortic insufficiency.  She was sent  for a CT scan of the chest to evaluate her ascending aorta since that  appeared to dilate some at the level of the sinotubular junction on her  cardiac catheterization.  An aortogram was not done at time of her  catheterization.  CT scan of the chest shows that the ascending aorta is  dilated with a maximum diameter of 4.2 cm.  The ascending aorta measures  about 3.9 cm above the level of the aortic valve.  The aorta does narrow  back down to about 25-28 mm at the level of the proximal aortic arch and  the descending aorta measures about 25 mm.  There is heavy calcification  of the aortic valve.  There is also a 9-mm noncalcified nodule in the  oblique fissure on the right.   PHYSICAL EXAMINATION:  Today, her blood pressure is 125/72, pulse 80 and  regular, respiratory rate 16 and unlabored.  She looks well.  Cardiac  exam shows regular rate and rhythm with a grade 3/6 systolic murmur of  aortic stenosis.  There is a 1/6 diastolic murmur.  Lungs are clear.   IMPRESSION:  The patient does have some aneurysmal dilatation of her  ascending aorta beginning just above her aortic valve and extending up  to the distal ascending aorta.  Given the fact that she is only 60 years  old with no coronary disease, I think it would be best to replace the  ascending aorta at the same time as aortic valve replacement.  This may  require Bentall procedure.  It may be possible to place a supracoronary  aortic graft depending on what this area looks like.  I think that if we  left her aorta alone there is a significant possibility that she may  have progressive dilatation and require further surgery for  this  aneurysm in the future which would be more complicated.  She also has  this 9-mm noncalcified nodule that we may be able to visualize and  resect at the time of her surgery.  I discussed the operative procedure  of aortic valve replacement and replacement of her ascending aorta using  a mechanical valve conduit.  We discussed the pros and cons of  mechanical and tissue valves, and I have recommended a mechanical valve  given her age.  She is inclined to agree with that and has no  contraindication to anticoagulation.  She understands the need for  lifelong anticoagulation and feels that she can be compliant with that.  We discussed the benefits and risks of surgery including but not limited  to bleeding, blood transfusion, infection, stroke, myocardial  infarction, heart block requiring permanent pacemaker, coronary  anastomotic stenosis, and death.  She understands all this and agrees to  proceed.  We will tentatively plan to do surgery in the middle of July  which is when she would like to schedule it.   Gilford Raid, M.D.  Electronically Signed   BB/MEDQ  D:  03/04/2010  T:  03/05/2010  Job:  [id number]

## 2011-02-02 NOTE — Assessment & Plan Note (Signed)
OFFICE VISIT   [name], [name]  DOB:  May 01, 1951                                        July 07, 2010  CHART #:  [id number]   HISTORY:  The patient returned to my office today so that I could  reexamine her chest incision due to some complaints of tenderness and  tightness along the incision.  This started about 3 weeks ago.  I last  saw her on April 21, 2010, at which time she was doing well.  She said  that she feels like the upper third of her sternotomy incision is  extremely tender and she cannot stand even her cloths touching it.  She  said it feels like sandpaper rubbing over her skin.  She also said that  feels tight and she has difficulty extending her neck and lifting her  chin up because it pulls on her incision and makes it hurt.   PHYSICAL EXAMINATION:  Vital Signs:  Her blood pressure is 119/77, pulse  81 and regular, respiratory rate is 18 unlabored, oxygen saturation on  room air is 96%.  She looks down in the dumps as she usually does.  She  is holding her chin down towards her chest.  The chest incision is  healing well.  There is some thickening of a scar in the upper third.  There are no signs of infection or inflammation.  There is mild  tenderness to palpation of the incision.  Cardiac:  Regular rate and  rhythm with a crisp mechanical valve click.  There is no murmur.  Lungs:  Clear.  There is no peripheral edema.   IMPRESSION:  The patient is doing well overall.  She does have some  hypersensitivity of her chest incision and I suspect this will gradually  improve over the time.  I encouraged her to try to keep her and neck  straight and her shoulders back as well as her chin up to try to stretch  out the chest incision scar.  I am afraid that if she keeps her head  flex down towards her chest as if she is going to end up with a  contraction of the scar and more discomfort.  I reassured her that this  should gradually  resolve over the time.  I told her she could use some  vitamin E oil or Mederma  on the scar if she would like.  She will continue to follow up with  [facility] and will contact me if she develops any further  problems with her incision.   Gilford Raid, M.D.  Electronically Signed   BB/MEDQ  D:  07/07/2010  T:  07/08/2010  Job:  [id number]

## 2011-02-02 NOTE — Assessment & Plan Note (Signed)
OFFICE VISIT   Laura, Costa  DOB:  02-Aug-1951                                        February 24, 2010  CHART #:  [id number]   HISTORY:  The patient returned to my office today for followup after a  cardiac catheterization on Feb 09, 2010.  I saw her for an initial new  patient consultation on Feb 10, 2010.  She has severe aortic stenosis  and moderate aortic insufficiency.  Her cardiac catheterization on Feb 09, 2010, showed normal left ventricular function.  There was mild  nonobstructive coronary disease with 20% stenosis in an obtuse marginal  branch.  The aortic valve area was calculated 0.68 sq. cm.  The mean  gradient was 62 with a peak to peak gradient of 72.  Cardiac index was  3.19.  Her right heart pressures were slightly elevated with a PA of  43/8 and a wedge pressure of 12.  Right atrial pressure was 11.  Left  ventriculogram suggested that there may be some enlargement of her  ascending aorta.  The left ventriculogram only really showed up to the  level of the sinotubular junction and then just above this.  The aorta  may have started to enlarge.   PHYSICAL EXAMINATION:  Today, her blood pressure is 139/74, pulse is 85  and regular, and respiratory rate is 18, unlabored.  Oxygen saturation  on room air is 97%.  She looks well.  Cardiac exam shows a regular rate  and rhythm with a normal S1 and a soft S2.  There is grade 3/6 systolic  murmur of aortic stenosis.  There may be a slight aortic insufficiency  murmur.  Her lungs are clear.  There is no peripheral edema.   IMPRESSION:  The patient has severe aortic stenosis and moderate aortic  insufficiency with nonobstructive coronary disease.  There is some  question whether she has some dilatation of her ascending aorta above  the level of the sinotubular junction.  This area was not seen on  ventriculography and a specific aortic root injection was not done.  Therefore, we will obtain a  CT scan of the chest without contrast to  evaluate this area since her creatinine was 1.3.  This should tell us  whether the ascending aorta is of normal size or not.  She has gone back  to see Dr. Tarri Glenn for followup of her tooth pain after previous root  canal.  She said that she did have to undergo another procedure to  debride the root of one of her teeth that was intruding on the maxillary  sinus and felt to be causing her pain.  He did not feel there is any  other source of infection.  She said that she does need to go back to  have a temporary filling converted to a permanent filling in the near  future.  She said she is feeling fairly well overall since she is not  exerting herself.  She would like to wait until after March 23, 2010, to  undergo surgery and I told her I think that is okay as long as she is  not exerting herself and her symptoms remained stable.  That will allow  plenty of time for her mouth to heal before proceeding with valve  replacement.  I will see her  back in the office after she has her CT  scan of the chest to discuss results with her and schedule surgery.   Gilford Raid, M.D.  Electronically Signed   BB/MEDQ  D:  02/24/2010  T:  02/25/2010  Job:  [id number]   cc:   Quincy Carnes, MD

## 2011-02-17 ENCOUNTER — Other Ambulatory Visit: Payer: Self-pay | Admitting: Cardiovascular Disease

## 2011-03-09 ENCOUNTER — Ambulatory Visit
Admission: RE | Admit: 2011-03-09 | Discharge: 2011-03-09 | Disposition: A | Discharge status: Home / Self Care | Payer: 59 | Source: Ambulatory Visit | Attending: Surgery | Admitting: Surgery

## 2011-03-09 ENCOUNTER — Ambulatory Visit (INDEPENDENT_AMBULATORY_CARE_PROVIDER_SITE_OTHER): Payer: 59 | Admitting: Surgery

## 2011-03-09 DIAGNOSIS — D381 Neoplasm of uncertain behavior of trachea, bronchus and lung: Secondary | ICD-10-CM

## 2011-03-09 DIAGNOSIS — R911 Solitary pulmonary nodule: Secondary | ICD-10-CM

## 2011-03-10 NOTE — Assessment & Plan Note (Signed)
OFFICE VISIT  AJANI, NEUBAUER DOB:  03/05/1951                                        March 09, 2011 CHART #:  [id number]  The patient returned to my office today for followup of a small right upper lobe lung nodule which was initially seen on her preoperative CT scan.  She had undergone aortic valve replacement with St. Jude mechanical valve and replacement of an ascending aortic aneurysm with supracoronary tube graft on April 01, 2010.  Her last CT scan of the chest done in December 2011 showed a stable 9-mm right upper lobe lung nodule bordering the major fissure.  There were a few other small nodules that had not been commented on previous CT scans.  These were around 2-3 mm.  Since I last saw, her only real specific complaint has been of progressive weight gain.  She said she is hungry all the time and feels that this may be related to her medications.  She denies any chest pain.  She has had no cough or sputum production.  She denies hemoptysis.  PHYSICAL EXAMINATION:  VITAL SIGNS:  Today, her blood pressure is 140/76, pulse is 83 and regular, respiratory rate is 20 and unlabored, and oxygen saturation on room air is 98%.  GENERAL:  She looks well. NECK:  There is no cervical or supraclavicular adenopathy.  LUNGS: Clear.  CARDIAC:  Regular rate and rhythm with crisp mechanical valve click.  There is no murmur, rub, or gallop.  Follow up CT scan of the chest today shows stable 8-mm posterior right upper lobe nodule that is unchanged dating back to her scan of June 2011.  There is a stable 2 to 3-mm nodule in the right apex as well as in the lingula.  IMPRESSION:  The patient has a stable 8-mm right upper lobe nodule as well as stable 2-3 mm nodular densities in the right apex and lingula. I suspect these are probably inflammatory since they have been stable and there are multiple nodules.  I will plan to see her back in about 1 year with a  repeat CT scan of the chest to follow up on these, but she is planning on following up with her primary physician in the near future concerning her medications and her weight gain.  Gilford Raid, M.D. Electronically Signed  BB/MEDQ  D:  03/09/2011  T:  03/10/2011  Job:  [id number]

## 2011-07-07 ENCOUNTER — Other Ambulatory Visit (HOSPITAL_COMMUNITY): Payer: Self-pay | Admitting: Internal Medicine

## 2011-07-07 DIAGNOSIS — Z139 Encounter for screening, unspecified: Secondary | ICD-10-CM

## 2011-07-12 ENCOUNTER — Ambulatory Visit (HOSPITAL_COMMUNITY): Payer: 59

## 2011-07-15 ENCOUNTER — Ambulatory Visit (HOSPITAL_COMMUNITY)
Admission: RE | Admit: 2011-07-15 | Discharge: 2011-07-15 | Disposition: A | Discharge status: Home / Self Care | Payer: 59 | Source: Ambulatory Visit | Attending: Internal Medicine | Admitting: Internal Medicine

## 2011-07-15 DIAGNOSIS — Z1231 Encounter for screening mammogram for malignant neoplasm of breast: Secondary | ICD-10-CM | POA: Insufficient documentation

## 2011-07-15 DIAGNOSIS — Z139 Encounter for screening, unspecified: Secondary | ICD-10-CM

## 2012-02-03 ENCOUNTER — Other Ambulatory Visit: Payer: Self-pay | Admitting: Surgery

## 2012-02-03 DIAGNOSIS — D381 Neoplasm of uncertain behavior of trachea, bronchus and lung: Secondary | ICD-10-CM

## 2012-03-14 ENCOUNTER — Ambulatory Visit: Payer: Self-pay | Admitting: Surgery

## 2012-03-14 ENCOUNTER — Other Ambulatory Visit: Payer: 59

## 2012-04-04 ENCOUNTER — Ambulatory Visit (INDEPENDENT_AMBULATORY_CARE_PROVIDER_SITE_OTHER): Payer: BC Managed Care – PPO | Admitting: Surgery

## 2012-04-04 ENCOUNTER — Ambulatory Visit
Admission: RE | Admit: 2012-04-04 | Discharge: 2012-04-04 | Disposition: A | Discharge status: Home / Self Care | Payer: BC Managed Care – PPO | Source: Ambulatory Visit | Attending: Surgery | Admitting: Surgery

## 2012-04-04 ENCOUNTER — Encounter: Payer: Self-pay | Admitting: Surgery

## 2012-04-04 VITALS — BP 153/83 | HR 80 | Resp 18 | Ht 62.0 in | Wt 258.0 lb

## 2012-04-04 DIAGNOSIS — D381 Neoplasm of uncertain behavior of trachea, bronchus and lung: Secondary | ICD-10-CM

## 2012-04-04 DIAGNOSIS — I071 Rheumatic tricuspid insufficiency: Secondary | ICD-10-CM | POA: Insufficient documentation

## 2012-04-04 DIAGNOSIS — E785 Hyperlipidemia, unspecified: Secondary | ICD-10-CM

## 2012-04-04 DIAGNOSIS — I359 Nonrheumatic aortic valve disorder, unspecified: Secondary | ICD-10-CM

## 2012-04-04 DIAGNOSIS — E782 Mixed hyperlipidemia: Secondary | ICD-10-CM | POA: Insufficient documentation

## 2012-04-04 DIAGNOSIS — I079 Rheumatic tricuspid valve disease, unspecified: Secondary | ICD-10-CM

## 2012-04-04 DIAGNOSIS — I351 Nonrheumatic aortic (valve) insufficiency: Secondary | ICD-10-CM | POA: Insufficient documentation

## 2012-04-04 DIAGNOSIS — E119 Type 2 diabetes mellitus without complications: Secondary | ICD-10-CM | POA: Insufficient documentation

## 2012-04-04 DIAGNOSIS — I350 Nonrheumatic aortic (valve) stenosis: Secondary | ICD-10-CM | POA: Insufficient documentation

## 2012-04-04 DIAGNOSIS — I10 Essential (primary) hypertension: Secondary | ICD-10-CM

## 2012-04-04 DIAGNOSIS — R918 Other nonspecific abnormal finding of lung field: Secondary | ICD-10-CM | POA: Insufficient documentation

## 2012-04-04 NOTE — Progress Notes (Signed)
[name] [address]            [place] [address]          [phone]      HPI:   The patient returned to my office today for followup of a 9-mm  right upper lobe lung nodule that was initially diagnosed on  preoperative CT scan.  She had undergone aortic valve replacement with a  St. Jude mechanical valve and replacement of an ascending aortic  aneurysm with a supra coronary tube graft on April 01, 2010.  A  preoperative CT scan of the chest was performed to assess the size of  her aorta and this had shown the 9-mm right upper lobe lung nodule.    CT scan the chest one year ago showed no significant change in the size of the right upper lobe nodule.   She reports that since around March or April of this year she has had a persistent cough and has been productive of some clear mucus. She has seen her primary physician twice for this and was treated with antibiotics and a bronchodilator. She said that her symptoms have improved a little but have not resolved. She denies any fever or chills. 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   Current Outpatient Prescriptions  Medication Sig Dispense Refill  . albuterol (PROVENTIL HFA;VENTOLIN HFA) 108 (90 BASE) MCG/ACT inhaler Inhale 2 puffs into the lungs every 6 (six) hours as needed.      . ALPRAZolam (XANAX) 0.5 MG tablet Take 0.5 mg by mouth at bedtime as needed.      Marland Kitchen aspirin 81 MG tablet Take 81 mg by mouth daily.      . fenofibrate (TRICOR) 145 MG tablet Take 145 mg by mouth daily.      . Ferrous Sulfate (IRON) 325 (65 FE) MG TABS Take 1 capsule by mouth every other day.      . furosemide (LASIX) 20 MG tablet Take 20 mg by mouth daily.      Marland Kitchen glimepiride (AMARYL) 2 MG tablet Take 2 mg by mouth daily before breakfast.      . lisinopril (PRINIVIL,ZESTRIL) 10 MG tablet Take 10 mg by mouth daily.      . metoprolol succinate (TOPROL-XL) 25 MG 24 hr tablet Take 25 mg by mouth 2 (two) times  daily.      . Multiple Vitamin (MULTIVITAMIN) tablet Take 1 tablet by mouth daily.      . rosuvastatin (CRESTOR) 20 MG tablet Take 20 mg by mouth daily.      Marland Kitchen warfarin (COUMADIN) 5 MG tablet Take 5 mg by mouth as directed.      . zolpidem (AMBIEN) 10 MG tablet Take 10 mg by mouth at bedtime as needed.         Physical Exam:  BP 153/83  Pulse 80  Resp 18  Ht 5\' 2"  (1.575 m)  Wt 258 lb (117.028 kg)  BMI 47.19 kg/m2  SpO2 95% She looks well. There is no cervical or supraclavicular adenopathy. Lung exam is clear. There is no wheezing. Cardiac exam shows regular rate and rhythm with a crisp mechanical valve click. There is no peripheral edema.  Diagnostic Tests:  *RADIOLOGY REPORT*   Clinical Data: Follow-up of pulmonary nodules.   CT CHEST WITHOUT CONTRAST   Technique:  Multidetector CT imaging of  the chest was performed following the standard protocol without IV contrast.   Comparison: 03/08/2001 and 03/03/2010   Findings: The nodule in the right upper lobe adjacent to the posterior aspect of the major fissure has enlarged since the prior exams and now measures 12 x 7 x 12 mm, increased from 8 x 5 x 11 mm on 03/09/2011.  It is substantially larger than on the prior study of 03/03/2010. The nodule is smoothly marginated may represent an intrapulmonary lymph node.  There is no hilar or mediastinal adenopathy.  Heart size is normal. Aortic valve is noted.   The tiny nodule in the lingula of the left upper lobe is 2-3 mm in size and is unchanged.   The patient has a progressive area of faint parenchymal infiltrate in the posterior medial aspect of the right lower lobe. There was minimal haziness in that area on the prior exam.  The lungs are otherwise clear.   IMPRESSION:   1.  Enlargement of the nodule in the right upper lobe adjacent to the posterior aspect of the right major fissure.  It is smoothly marginated and probably represents an intrapulmonary node but  is clearly substantially larger than 2 years ago. 2.  No change in the tiny pulmonary nodule in the lingula. 3.  New vague area of infiltrate in the posterior medial aspect of the right lower lobe, increased since the prior study.  Chronic aspiration might cause this abnormality.   Original Report Authenticated By: Larey Seat, M.D.   Impression:  The CT scan shows enlargement of the right upper lobe nodule from 8 x 11 mm to 12 x 12 mm. This is a smoothly marginated lesion which I think makes it more likely to be benign. The radiologist thought it could be an intrapulmonary lymph node. There is no hilar or mediastinal adenopathy. This could certainly be a benign pulmonary tumor. She also has a vague area of infiltrate in the posterior medial aspect of the right lower lobe. There was minimal haziness in this area on the prior exam one year ago. The etiology of this is unclear but it certainly could be related to her cough over the past several months. It  is in a dependent portion of the lung suggesting possible aspiration although she denies any symptoms of gastroesophageal reflux or difficulty swallowing.  Plan:  I am going to plan to see her back in 6 months with a repeat CT scan the chest to reevaluate the right upper lobe lung nodule. She will followup with Dr. Gerarda Fraction concerning the right lower lobe infiltrate and her cough. She may require referral to pulmonary medicine for further workup.

## 2012-04-26 ENCOUNTER — Ambulatory Visit (HOSPITAL_COMMUNITY)
Admission: RE | Admit: 2012-04-26 | Discharge: 2012-04-26 | Disposition: A | Discharge status: Home / Self Care | Payer: BC Managed Care – PPO | Source: Ambulatory Visit | Attending: Family Medicine | Admitting: Family Medicine

## 2012-04-26 ENCOUNTER — Other Ambulatory Visit (HOSPITAL_COMMUNITY): Payer: Self-pay | Admitting: Family Medicine

## 2012-04-26 DIAGNOSIS — M25579 Pain in unspecified ankle and joints of unspecified foot: Secondary | ICD-10-CM

## 2012-06-29 ENCOUNTER — Other Ambulatory Visit (HOSPITAL_COMMUNITY): Payer: Self-pay | Admitting: Family Medicine

## 2012-06-29 DIAGNOSIS — IMO0002 Reserved for concepts with insufficient information to code with codable children: Secondary | ICD-10-CM

## 2012-06-30 ENCOUNTER — Ambulatory Visit (HOSPITAL_COMMUNITY)
Admission: RE | Admit: 2012-06-30 | Discharge: 2012-06-30 | Disposition: A | Discharge status: Home / Self Care | Payer: BC Managed Care – PPO | Source: Ambulatory Visit | Attending: Family Medicine | Admitting: Family Medicine

## 2012-06-30 DIAGNOSIS — M545 Low back pain, unspecified: Secondary | ICD-10-CM | POA: Insufficient documentation

## 2012-06-30 DIAGNOSIS — IMO0002 Reserved for concepts with insufficient information to code with codable children: Secondary | ICD-10-CM

## 2012-06-30 DIAGNOSIS — M5126 Other intervertebral disc displacement, lumbar region: Secondary | ICD-10-CM | POA: Insufficient documentation

## 2012-06-30 DIAGNOSIS — M79609 Pain in unspecified limb: Secondary | ICD-10-CM | POA: Insufficient documentation

## 2012-06-30 DIAGNOSIS — M48061 Spinal stenosis, lumbar region without neurogenic claudication: Secondary | ICD-10-CM | POA: Insufficient documentation

## 2012-07-31 ENCOUNTER — Other Ambulatory Visit: Payer: Self-pay | Admitting: Neurosurgery

## 2012-07-31 DIAGNOSIS — M549 Dorsalgia, unspecified: Secondary | ICD-10-CM

## 2012-07-31 DIAGNOSIS — M5410 Radiculopathy, site unspecified: Secondary | ICD-10-CM

## 2012-09-05 ENCOUNTER — Other Ambulatory Visit (HOSPITAL_COMMUNITY): Payer: Self-pay | Admitting: Cardiovascular Disease

## 2012-09-05 DIAGNOSIS — R0602 Shortness of breath: Secondary | ICD-10-CM

## 2012-09-05 DIAGNOSIS — Z952 Presence of prosthetic heart valve: Secondary | ICD-10-CM

## 2012-09-14 ENCOUNTER — Other Ambulatory Visit: Payer: Self-pay | Admitting: *Deleted

## 2012-09-14 DIAGNOSIS — R918 Other nonspecific abnormal finding of lung field: Secondary | ICD-10-CM

## 2012-09-18 ENCOUNTER — Ambulatory Visit (HOSPITAL_COMMUNITY)
Admission: RE | Admit: 2012-09-18 | Discharge: 2012-09-18 | Disposition: A | Discharge status: Home / Self Care | Payer: BC Managed Care – PPO | Source: Ambulatory Visit | Attending: Cardiovascular Disease | Admitting: Cardiovascular Disease

## 2012-09-18 DIAGNOSIS — I350 Nonrheumatic aortic (valve) stenosis: Secondary | ICD-10-CM

## 2012-09-18 DIAGNOSIS — I10 Essential (primary) hypertension: Secondary | ICD-10-CM | POA: Insufficient documentation

## 2012-09-18 DIAGNOSIS — R0602 Shortness of breath: Secondary | ICD-10-CM

## 2012-09-18 DIAGNOSIS — E785 Hyperlipidemia, unspecified: Secondary | ICD-10-CM | POA: Insufficient documentation

## 2012-09-18 DIAGNOSIS — I079 Rheumatic tricuspid valve disease, unspecified: Secondary | ICD-10-CM | POA: Insufficient documentation

## 2012-09-18 DIAGNOSIS — I359 Nonrheumatic aortic valve disorder, unspecified: Secondary | ICD-10-CM | POA: Insufficient documentation

## 2012-09-18 DIAGNOSIS — Z954 Presence of other heart-valve replacement: Secondary | ICD-10-CM | POA: Insufficient documentation

## 2012-09-18 DIAGNOSIS — Z952 Presence of prosthetic heart valve: Secondary | ICD-10-CM

## 2012-09-18 HISTORY — DX: Nonrheumatic aortic (valve) stenosis: I35.0

## 2012-09-18 NOTE — Progress Notes (Signed)
[facility]   2D echo completed 09/18/2012.   Jamison Neighbor, RDCS

## 2012-09-27 ENCOUNTER — Other Ambulatory Visit: Payer: Self-pay | Admitting: *Deleted

## 2012-09-29 ENCOUNTER — Other Ambulatory Visit (HOSPITAL_COMMUNITY): Payer: Self-pay | Admitting: Internal Medicine

## 2012-09-29 DIAGNOSIS — Z01419 Encounter for gynecological examination (general) (routine) without abnormal findings: Secondary | ICD-10-CM

## 2012-10-02 ENCOUNTER — Ambulatory Visit (HOSPITAL_COMMUNITY)
Admission: RE | Admit: 2012-10-02 | Discharge: 2012-10-02 | Disposition: A | Discharge status: Home / Self Care | Payer: BC Managed Care – PPO | Source: Ambulatory Visit | Attending: Internal Medicine | Admitting: Internal Medicine

## 2012-10-02 DIAGNOSIS — Z01419 Encounter for gynecological examination (general) (routine) without abnormal findings: Secondary | ICD-10-CM

## 2012-10-02 DIAGNOSIS — Z1231 Encounter for screening mammogram for malignant neoplasm of breast: Secondary | ICD-10-CM | POA: Insufficient documentation

## 2012-10-03 ENCOUNTER — Ambulatory Visit
Admission: RE | Admit: 2012-10-03 | Discharge: 2012-10-03 | Disposition: A | Discharge status: Home / Self Care | Payer: BC Managed Care – PPO | Source: Ambulatory Visit | Attending: Surgery | Admitting: Surgery

## 2012-10-03 ENCOUNTER — Ambulatory Visit: Payer: BC Managed Care – PPO | Admitting: Surgery

## 2012-10-03 ENCOUNTER — Encounter: Payer: Self-pay | Admitting: Surgery

## 2012-10-03 ENCOUNTER — Ambulatory Visit (INDEPENDENT_AMBULATORY_CARE_PROVIDER_SITE_OTHER): Payer: BC Managed Care – PPO | Admitting: Surgery

## 2012-10-03 VITALS — BP 166/83 | HR 71 | Resp 16 | Ht 62.0 in | Wt 258.0 lb

## 2012-10-03 DIAGNOSIS — R918 Other nonspecific abnormal finding of lung field: Secondary | ICD-10-CM

## 2012-10-03 DIAGNOSIS — R911 Solitary pulmonary nodule: Secondary | ICD-10-CM

## 2012-10-03 NOTE — Progress Notes (Signed)
[facility] [facility] [address]            [place] [address]          [phone]      HPI:  The patient returns  today for followup of a 9 mm right upper lobe lung nodule that was initially diagnosed on preoperative CT scan that was done prior to surgery on 04/01/2010 for aortic valve replacement and replacement of an ascending aortic aneurysm with a supra-coronary tube graft. She denies any pulmonary complaints at this time. Her main problem is lower back pain and surgery has been recommended by Dr. Arnoldo Morale. CT scan of the chest today shows little to slight decrease in the size of the right upper lobe pulmonary nodule. The previously described opacity in the posterior medial right lower lobe has resolved. There is a new small parenchymal opacity in the posterior medial aspect of left lower lobe that looks like an area of infiltrate.  Current Outpatient Prescriptions  Medication Sig Dispense Refill  . albuterol (PROVENTIL HFA;VENTOLIN HFA) 108 (90 BASE) MCG/ACT inhaler Inhale 2 puffs into the lungs every 6 (six) hours as needed.      . ALPRAZolam (XANAX) 0.5 MG tablet Take 0.5 mg by mouth at bedtime as needed.      Marland Kitchen aspirin 81 MG tablet Take 81 mg by mouth daily.      . fenofibrate (TRICOR) 145 MG tablet Take 145 mg by mouth daily.      . Ferrous Sulfate (IRON) 325 (65 FE) MG TABS Take 1 capsule by mouth every other day.      . furosemide (LASIX) 20 MG tablet Take 20 mg by mouth daily.      Marland Kitchen glimepiride (AMARYL) 2 MG tablet Take 2 mg by mouth daily before breakfast.      . lisinopril (PRINIVIL,ZESTRIL) 10 MG tablet Take 10 mg by mouth daily.      . metoprolol succinate (TOPROL-XL) 25 MG 24 hr tablet Take 25 mg by mouth 2 (two) times daily.      . Multiple Vitamin (MULTIVITAMIN) tablet Take 1 tablet by mouth daily.      . multivitamin-lutein (OCUVITE-LUTEIN) CAPS Take 1 capsule by mouth daily.      Marland Kitchen oxyCODONE-acetaminophen (PERCOCET) 10-325 MG per tablet Take 1 tablet by  mouth every 4 (four) hours as needed.      . rosuvastatin (CRESTOR) 20 MG tablet Take 20 mg by mouth daily.      Marland Kitchen warfarin (COUMADIN) 5 MG tablet Take 5 mg by mouth as directed.      . zolpidem (AMBIEN) 10 MG tablet Take 10 mg by mouth at bedtime as needed.         Physical Exam: BP 166/83  Pulse 71  Resp 16  Ht 5\' 2"  (1.575 m)  Wt 258 lb (117.028 kg)  BMI 47.19 kg/m2  SpO2 97% She looks uncomfortable. Lung exam is clear.  Diagnostic Tests:  *RADIOLOGY REPORT*   Clinical Data: Follow up of lung nodules, cough for 7 days   CT CHEST WITHOUT CONTRAST   Technique:  Multidetector CT imaging of the chest was performed following the standard protocol without IV contrast.   Comparison: CT chest of 04/04/2012   Findings: The previously noted nodule in the right upper lobe abutting the major fissure appears slightly smaller than on the prior study measuring 10 x 4 mm compared to 12  x 7 mm previously. The very faint nodular opacity described anteriorly in the lingula is stable in almost imperceptible.  The infiltrate described in the posterior medial right lower lobe has resolved.  However there is now more opacity in the posterior medial left lower lobe which appears new, and continued follow-up is recommended.  No pleural effusion is seen.   On soft tissue window images, the thyroid gland is unremarkable. The thoracic aorta and pulmonary arteries are unremarkable in the unenhanced state and cardiomegaly is stable.  The liver appears somewhat low in attenuation which may indicate fatty infiltration. Median sternotomy sutures are noted.  No acute bony abnormality is seen   IMPRESSION: . 1.  Little change to slight decrease in size of the nodule described previously within the posterior right upper lobe abutting the major fissure, possibly representing a lymph node.  No new parenchymal nodule is seen. 2.  There is new parenchymal opacity within the posterior medial left  lower lobe and continued follow-up is recommended. 3.  The previously described opacity in the posterior medial right lower lobe has resolved. 4.  Suspect fatty infiltration of the liver.     Original Report Authenticated By: Ivar Drape, M.D.     Impression:  The right upper lobe pulmonary nodule is stable to slightly decreased in size and is most likely an intrapulmonary lymph node. There is a new small parenchymal opacity within the posterior medial aspect of the left lower lobe which does not look like a nodule or mass and is most likely an infectious or inflammatory infiltrate. I don't think she requires repeat CT scan to follow either of these lesions.  Plan:  She will continue followup with her primary physician.

## 2012-10-05 DIAGNOSIS — Z0271 Encounter for disability determination: Secondary | ICD-10-CM

## 2012-10-10 ENCOUNTER — Ambulatory Visit: Payer: BC Managed Care – PPO | Admitting: Thoracic Surgery (Cardiothoracic Vascular Surgery)

## 2012-11-28 ENCOUNTER — Ambulatory Visit: Payer: Self-pay | Admitting: Cardiovascular Disease

## 2012-11-28 DIAGNOSIS — Z952 Presence of prosthetic heart valve: Secondary | ICD-10-CM | POA: Insufficient documentation

## 2012-11-28 DIAGNOSIS — Z7901 Long term (current) use of anticoagulants: Secondary | ICD-10-CM | POA: Insufficient documentation

## 2013-02-09 ENCOUNTER — Other Ambulatory Visit: Payer: Self-pay | Admitting: Neurosurgery

## 2013-02-11 ENCOUNTER — Encounter: Payer: Self-pay | Admitting: *Deleted

## 2013-02-13 ENCOUNTER — Other Ambulatory Visit: Payer: Self-pay | Admitting: *Deleted

## 2013-02-13 MED ORDER — ROSUVASTATIN CALCIUM 20 MG PO TABS: 20.0000 mg | ORAL_TABLET | Freq: Every day | ORAL | Status: DC

## 2013-02-13 NOTE — Telephone Encounter (Signed)
RX refill for crestor

## 2013-02-14 ENCOUNTER — Encounter: Payer: Self-pay | Admitting: Cardiovascular Disease

## 2013-02-14 ENCOUNTER — Encounter: Payer: Self-pay | Admitting: Pharmacist Clinician (PhC)/ Clinical Pharmacy Specialist

## 2013-02-14 ENCOUNTER — Ambulatory Visit (INDEPENDENT_AMBULATORY_CARE_PROVIDER_SITE_OTHER): Payer: BC Managed Care – PPO | Admitting: Cardiovascular Disease

## 2013-02-14 VITALS — BP 120/80 | HR 85 | Resp 20 | Ht 62.0 in | Wt 254.0 lb

## 2013-02-14 DIAGNOSIS — I10 Essential (primary) hypertension: Secondary | ICD-10-CM

## 2013-02-14 DIAGNOSIS — Z7901 Long term (current) use of anticoagulants: Secondary | ICD-10-CM

## 2013-02-14 DIAGNOSIS — Z952 Presence of prosthetic heart valve: Secondary | ICD-10-CM

## 2013-02-14 DIAGNOSIS — M48061 Spinal stenosis, lumbar region without neurogenic claudication: Secondary | ICD-10-CM

## 2013-02-14 DIAGNOSIS — N183 Chronic kidney disease, stage 3 unspecified: Secondary | ICD-10-CM

## 2013-02-14 DIAGNOSIS — E785 Hyperlipidemia, unspecified: Secondary | ICD-10-CM

## 2013-02-14 DIAGNOSIS — N1832 Chronic kidney disease, stage 3b: Secondary | ICD-10-CM | POA: Insufficient documentation

## 2013-02-14 DIAGNOSIS — M4316 Spondylolisthesis, lumbar region: Secondary | ICD-10-CM | POA: Insufficient documentation

## 2013-02-14 DIAGNOSIS — Z954 Presence of other heart-valve replacement: Secondary | ICD-10-CM

## 2013-02-14 MED ORDER — ROSUVASTATIN CALCIUM 20 MG PO TABS: 10.0000 mg | ORAL_TABLET | Freq: Every day | ORAL | Status: DC

## 2013-02-14 NOTE — Patient Instructions (Signed)
You will receive written instructions  from our coumadin clinic, regarding management of anticoagulation before and after back surgery.

## 2013-02-14 NOTE — Assessment & Plan Note (Signed)
Well-controlled, note mildly elevated serum creatinine.

## 2013-02-14 NOTE — Assessment & Plan Note (Signed)
Her physical activity is limited by her severe back problems, but she has no complaints that suggest aortic valve stenosis or congestive heart failure. An echocardiogram that was performed in December of last year showed very good prosthetic valve gradients despite the small valve size (peak 18, mean 10 mm Hg).

## 2013-02-14 NOTE — Assessment & Plan Note (Signed)
Warfarin will have to be interrupted for the planned spine surgery. We will plan to "bridge" to the preoperative period with subcutaneous enoxaparin, self administered. Postoperatively I think it would be safer to simply gradually resume her warfarin rather than run the risk of bleeding with a rapid acting agent such as enoxaparin. We will give her a prescription for enoxaparin and detailed instructions on the time being and use of these agents.

## 2013-02-14 NOTE — Assessment & Plan Note (Signed)
Surgery is planned for June 11 Dr. Arnoldo Morale

## 2013-02-14 NOTE — Assessment & Plan Note (Signed)
Serum creatinine stable around 1.5 mg/deciliter. Presumably related to hypertensive nephrosclerosis, possibly with a component of diabetic nephropathy

## 2013-02-14 NOTE — Progress Notes (Signed)
Patient ID: [name], female   DOB: 1951/08/23, 61 y.o.   MRN: [id number]   Reason for office visit:  Status post mechanical aortic valve replacement and repair of an ascending aortic aneurysm  Preoperative evaluation (lumbar spinal laminectomy)  [name] has not had any cardiac event since her last appointment in December. She has a history of a Bentall procedure for a bicuspid aortic valve with ascending aortic aneurysm, performed by [name] in 2011.  She has disabling back pain and plans to2 undergo lumbar spine surgery by [name] next month. Allergies  Allergen Reactions  . Morphine And Related Other (See Comments)    BP drops    Current Outpatient Prescriptions  Medication Sig Dispense Refill  . albuterol (PROVENTIL HFA;VENTOLIN HFA) 108 (90 BASE) MCG/ACT inhaler Inhale 2 puffs into the lungs every 6 (six) hours as needed.      . ALPRAZolam (XANAX) 0.5 MG tablet Take 0.5 mg by mouth at bedtime as needed.      Marland Kitchen aspirin 81 MG tablet Take 81 mg by mouth daily.      . cyclobenzaprine (FLEXERIL) 10 MG tablet Take 10 mg by mouth 3 (three) times daily as needed for muscle spasms.      . fenofibrate (TRICOR) 145 MG tablet Take 145 mg by mouth daily.      . Ferrous Sulfate (IRON) 325 (65 FE) MG TABS Take 1 capsule by mouth every other day.      . furosemide (LASIX) 20 MG tablet Take 20 mg by mouth daily.      Marland Kitchen glimepiride (AMARYL) 2 MG tablet Take 2 mg by mouth daily before breakfast.      . HYDROcodone-acetaminophen (NORCO/VICODIN) 5-325 MG per tablet Take 1 tablet by mouth every 4 (four) hours as needed for pain.      Marland Kitchen lisinopril (PRINIVIL,ZESTRIL) 10 MG tablet Take 10 mg by mouth daily.      . metoprolol succinate (TOPROL-XL) 25 MG 24 hr tablet Take 25 mg by mouth 2 (two) times daily.      . Multiple Vitamin (MULTIVITAMIN) tablet Take 1 tablet by mouth daily.      . multivitamin-lutein (OCUVITE-LUTEIN) CAPS Take 1 capsule by mouth daily.      Marland Kitchen  oxyCODONE-acetaminophen (PERCOCET) 10-325 MG per tablet Take 1 tablet by mouth every 4 (four) hours as needed.      . rosuvastatin (CRESTOR) 20 MG tablet Take 0.5 tablets (10 mg total) by mouth daily.  7 tablet  0  . rosuvastatin (CRESTOR) 20 MG tablet Take 0.5 tablets (10 mg total) by mouth daily.  7 tablet  0  . warfarin (COUMADIN) 5 MG tablet Take 5 mg by mouth as directed.      . zolpidem (AMBIEN) 10 MG tablet Take 10 mg by mouth at bedtime as needed.       No current facility-administered medications for this visit.    Past Medical History  Diagnosis Date  . Diabetes mellitus   . Hypertension   . Hyperlipidemia   . AS (aortic stenosis) 09/18/2012    2D Echo EF 55%-60%  . AI (aortic insufficiency)   . Tricuspid regurgitation     Past Surgical History  Procedure Laterality Date  . Aortic valve replacement  04/01/2010    21-mm Sr Jude Regent ;mechanical valve   . Replacement  of ascending aortic aneurysm  04/01/2010    28-mm supracoronary Hemashield tube graft using deep hypothermic circulatory arrest  . Cardiac catheterization  02/09/2010  Family History  Problem Relation Age of Onset  . CAD    . Cancer - Prostate Father   . Cancer - Lung Maternal Grandfather   . Diabetes    . Hypertension      History   Social History  . Marital Status: Married    Spouse Name: N/A    Number of Children: N/A  . Years of Education: N/A   Occupational History  . Not on file.   Social History Main Topics  . Smoking status: Never Smoker   . Smokeless tobacco: Never Used  . Alcohol Use: No  . Drug Use: Not on file  . Sexually Active: Not on file   Other Topics Concern  . Not on file   Social History Narrative  . No narrative on file    Review of systems: The patient specifically denies any chest pain at rest exertion, dyspnea at rest or with exertion, orthopnea, paroxysmal nocturnal dyspnea, syncope, palpitations, focal neurological deficits, intermittent claudication,  lower extremity edema, unexplained weight gain, cough, hemoptysis or wheezing. She has chronic and severe low back pain and is unable to take nonsteroidal anti-inflammatory drugs due to warfarin therapy. Denies any bleeding problems such as melena or hematochezia or hematemesis and has not had hematuria she doesn't even complain of easy bruising. She has not been troubled by fever chills or weight changes.  PHYSICAL EXAM BP 120/80  Pulse 85  Resp 20  Ht 5\' 2"  (1.575 m)  Wt 254 lb (115.214 kg)  BMI 46.45 kg/m2  General: Alert, oriented x3, no distress, morbidly obese Head: no evidence of trauma, PERRL, EOMI, no exophtalmos or lid lag, no myxedema, no xanthelasma; normal ears, nose and oropharynx Neck: normal jugular venous pulsations and no hepatojugular reflux; brisk carotid pulses without delay and no carotid bruits Chest: clear to auscultation, no signs of consolidation by percussion or palpation, normal fremitus, symmetrical and full respiratory excursions; well-healed sternotomy scar Cardiovascular: normal position and quality of the apical impulse, regular rhythm, normal first and second heart sounds, very crisp prosthetic valve clicks, a grade [id number] early peaking systolic ejection murmur up and down both sides of the sternal border, no murmurs, rubs or gallops Abdomen: no tenderness or distention, no masses by palpation, no abnormal pulsatility or arterial bruits, normal bowel sounds, no hepatosplenomegaly Extremities: no clubbing, cyanosis or edema; 2+ radial, ulnar and brachial pulses bilaterally; 2+ right femoral, posterior tibial and dorsalis pedis pulses; 2+ left femoral, posterior tibial and dorsalis pedis pulses; no subclavian or femoral bruits Neurological: grossly nonfocal   EKG:  normal sinus rhythm with left ventricular hypertrophy and secondary repolariziation abnormalities    Lipid Panel  No results found for this basename: chol, trig, hdl, cholhdl, vldl, ldlcalc     BMET    Component Value Date/Time   NA 140 04/06/2010 0348   K 4.1 04/06/2010 0348   CL 96 04/06/2010 0348   CO2 35* 04/06/2010 0348   GLUCOSE 91 04/06/2010 0348   BUN 22 04/06/2010 0348   CREATININE 1.50* 04/06/2010 0348   CALCIUM 9.9 04/06/2010 0348   GFRNONAA 35* 04/06/2010 0348   GFRAA  Value: 43        The eGFR has been calculated using the MDRD equation. This calculation has not been validated in all clinical situations. eGFR's persistently <60 mL/min signify possible Chronic Kidney Disease.* 04/06/2010 0348     ASSESSMENT AND PLAN  Long term (current) use of anticoagulants Warfarin will have to be interrupted for the planned  spine surgery. We will plan to "bridge" to the preoperative period with subcutaneous enoxaparin, self administered. Postoperatively I think it would be safer to simply gradually resume her warfarin rather than run the risk of bleeding with a rapid acting agent such as enoxaparin. We will give her a prescription for enoxaparin and detailed instructions on the time being and use of these agents.  Neural foraminal stenosis of lumbar spine Surgery is planned for June 11 [name]  Hx of mechanical aortic valve replacement Her physical activity is limited by her severe back problems, but she has no complaints that suggest aortic valve stenosis or congestive heart failure. An echocardiogram that was performed in December of last year showed very good prosthetic valve gradients despite the small valve size (peak 18, mean 10 mm Hg).  Hypertension Well-controlled, note mildly elevated serum creatinine.  Chronic kidney disease (CKD), stage III (moderate) Serum creatinine stable around 1.5 mg/deciliter. Presumably related to hypertensive nephrosclerosis, possibly with a component of diabetic nephropathy   Overall her risk of cardiovascular complications of the planned lumbar spine surgery is low to moderate. The risk is related to the presence of mechanical aortic  valve. We will try to keep the overall duration off anticoagulations at the shortest possible. Preoperatively will "bridge" with enoxaparin. Postoperatively will resume warfarin and monitor closely.  Holli Humbles, MD, [facility] [place] [phone] office [phone] pager

## 2013-02-16 ENCOUNTER — Other Ambulatory Visit: Payer: Self-pay | Admitting: Pharmacist Clinician (PhC)/ Clinical Pharmacy Specialist

## 2013-02-16 MED ORDER — ENOXAPARIN SODIUM 120 MG/0.8ML ~~LOC~~ SOLN: 120.0000 mg | Freq: Two times a day (BID) | SUBCUTANEOUS | Status: DC

## 2013-02-20 ENCOUNTER — Encounter (HOSPITAL_COMMUNITY): Payer: Self-pay | Admitting: Pharmacy Technician

## 2013-02-21 NOTE — Pre-Procedure Instructions (Signed)
[name]  02/21/2013   Your procedure is scheduled on:  02/28/13  Report to [facility] at 1145 AM.  Call this number if you have problems the morning of surgery: [phone]   Remember:   Do not eat food or drink liquids after midnight.   Take these medicines the morning of surgery with A SIP OF WATER: all inhalers,xanax,flexeril,metoprolol   Do not wear jewelry, make-up or nail polish.  Do not wear lotions, powders, or perfumes. You may wear deodorant.  Do not shave 48 hours prior to surgery. Men may shave face and neck.  Do not bring valuables to the hospital.  [facility] is not responsible                   for any belongings or valuables.  Contacts, dentures or bridgework may not be worn into surgery.  Leave suitcase in the car. After surgery it may be brought to your room.  For patients admitted to the hospital, checkout time is 11:00 AM the day of  discharge.   Patients discharged the day of surgery will not be allowed to drive  home.  Name and phone number of your driver: family  Special Instructions: Shower using CHG 2 nights before surgery and the night before surgery.  If you shower the day of surgery use CHG.  Use special wash - you have one bottle of CHG for all showers.  You should use approximately 1/3 of the bottle for each shower.   Please read over the following fact sheets that you were given: Pain Booklet, Coughing and Deep Breathing, MRSA Information and Surgical Site Infection Prevention

## 2013-02-22 ENCOUNTER — Encounter (HOSPITAL_COMMUNITY)
Admission: RE | Admit: 2013-02-22 | Discharge: 2013-02-22 | Disposition: A | Discharge status: Home / Self Care | Payer: BC Managed Care – PPO | Source: Ambulatory Visit | Attending: Neurosurgery | Admitting: Neurosurgery

## 2013-02-22 ENCOUNTER — Encounter (HOSPITAL_COMMUNITY): Payer: Self-pay

## 2013-02-22 DIAGNOSIS — Z01812 Encounter for preprocedural laboratory examination: Secondary | ICD-10-CM | POA: Insufficient documentation

## 2013-02-22 DIAGNOSIS — Z01818 Encounter for other preprocedural examination: Secondary | ICD-10-CM | POA: Insufficient documentation

## 2013-02-22 HISTORY — DX: Obesity, unspecified: E66.9

## 2013-02-22 HISTORY — DX: Nausea with vomiting, unspecified: R11.2

## 2013-02-22 HISTORY — DX: Cough: R05

## 2013-02-22 HISTORY — DX: Unspecified osteoarthritis, unspecified site: M19.90

## 2013-02-22 HISTORY — DX: Chronic kidney disease, unspecified: N18.9

## 2013-02-22 HISTORY — DX: Cough, unspecified: R05.9

## 2013-02-22 HISTORY — DX: Other specified postprocedural states: Z98.890

## 2013-02-22 LAB — PROTIME-INR
INR: 2.05 — ABNORMAL HIGH (ref 0.00–1.49)
Prothrombin Time: 22.3 seconds — ABNORMAL HIGH (ref 11.6–15.2)

## 2013-02-22 LAB — CBC
HCT: 43.8 % (ref 36.0–46.0)
Hemoglobin: 14.1 g/dL (ref 12.0–15.0)
MCH: 29.3 pg (ref 26.0–34.0)
MCHC: 32.2 g/dL (ref 30.0–36.0)
MCV: 91.1 fL (ref 78.0–100.0)
Platelets: 289 10*3/uL (ref 150–400)
RBC: 4.81 MIL/uL (ref 3.87–5.11)
RDW: 13.5 % (ref 11.5–15.5)
WBC: 6.9 10*3/uL (ref 4.0–10.5)

## 2013-02-22 LAB — BASIC METABOLIC PANEL
BUN: 23 mg/dL (ref 6–23)
CO2: 28 mEq/L (ref 19–32)
Calcium: 10.7 mg/dL — ABNORMAL HIGH (ref 8.4–10.5)
Chloride: 100 mEq/L (ref 96–112)
Creatinine, Ser: 1.35 mg/dL — ABNORMAL HIGH (ref 0.50–1.10)
GFR calc Af Amer: 47 mL/min — ABNORMAL LOW (ref >90)
GFR calc non Af Amer: 41 mL/min — ABNORMAL LOW (ref >90)
Glucose, Bld: 102 mg/dL — ABNORMAL HIGH (ref 70–99)
Potassium: 4.1 mEq/L (ref 3.5–5.1)
Sodium: 137 mEq/L (ref 135–145)

## 2013-02-22 LAB — SURGICAL PCR SCREEN
MRSA, PCR: NEGATIVE
Staphylococcus aureus: NEGATIVE

## 2013-02-22 NOTE — Progress Notes (Signed)
Dr Rosanne Ashing for ekg.  lovenox to begin tomorrow

## 2013-02-23 NOTE — Progress Notes (Signed)
Anesthesia Chart Review:  Patient is a 62 year old female scheduled for L3-4, L4-5 laminectomy on 02/28/13 by Dr. Arnoldo Morale. History includes mechanical AVR and repair of ascending aortic aneurysm (Bentall procedure) in 2011, morbid obesity, non-smoker, post-operative N/V, CKD, HTN, HLD, arthritis, CKD. PCP is Dr. Redmond School.  Cardiologist is Dr. Recardo Evangelist who has recommended a Lovenox bridge while patient is off of Coumadin.  He last saw her on 02/14/13.  EKG then showed NSR, minimal voltage criteria for LVH, possible anterior infarct (age undetermined), T wave abnormality, consider ischemia.  Prior EKG on 04/02/10 showed NSR, left BBB.  Echo on 12320/13 showed: - Left ventricle: The cavity size was normal. There was moderate concentric hypertrophy. Systolic function was normal. The estimated ejection fraction was in the range of 55% to 60%. Incoordinate, post-operative septum. Doppler parameters are consistent with abnormal left ventricular relaxation (grade 1 diastolic dysfunction). E/e' ratio is ~10, suggesting borderline elevated LV filling pressure. - Aortic valve: A 21 mm St. Jude mechanical AVR is seen with normal leaflet motion. Peak and mean gradients across the valve are 18 and 10 mmHg, which are acceptable for this valve in the aortic position. Trivial AI. Valve area: 1.42cm^2(VTI). Valve area: 1.39cm^2 (Vmax). - Aorta: Echogenic, probably from root replacement. Aortic root dimension: 54mm (ED). - Mitral valve: Calcified annulus. No significant regurgitation. Valve area by continuity equation (using LVOT flow): 2.62cm^2. - Left atrium: LA Volume/BSA 20.7 ml/m2. The atrium was normal in size. - Tricuspid valve: Mild regurgitation. - Pulmonary arteries: PA peak pressure: 74mm Hg (S). - Systemic veins: The IVC measures <2.1 cm but does not collapse more than 50%, suggesting elevated RA pressure of 10 mmHg.  Cardiac cath on 02/09/10 (pre-Bentall) showed mild non-obstructive CAD (OM20%), normal LV  function, critical AS, mildly elevated right heart pressures.   CXR on 02/22/13 showed no acute cardiopulmonary findings.  Preoperative labs noted.  She will need a repeat PT/PTT on the day of surgery.  If labs are reasonable then I would anticipate that she could proceed as planned.  George Hugh [facility] Short Stay Center/Anesthesiology Phone [phone] 02/23/2013 11:59 AM

## 2013-02-27 ENCOUNTER — Encounter: Payer: Self-pay | Admitting: Cardiovascular Disease

## 2013-02-27 MED ORDER — CEFAZOLIN SODIUM-DEXTROSE 2-3 GM-% IV SOLR
2.0000 g | INTRAVENOUS | Status: AC
  Administered 2013-02-28: 2 g via INTRAVENOUS

## 2013-02-28 ENCOUNTER — Other Ambulatory Visit: Payer: Self-pay | Admitting: *Deleted

## 2013-02-28 ENCOUNTER — Ambulatory Visit (HOSPITAL_COMMUNITY): Payer: BC Managed Care – PPO | Admitting: Anesthesiology

## 2013-02-28 ENCOUNTER — Encounter (HOSPITAL_COMMUNITY)
Admission: RE | Disposition: A | Discharge status: Home / Self Care | Payer: Self-pay | Source: Ambulatory Visit | Attending: Neurosurgery

## 2013-02-28 ENCOUNTER — Encounter (HOSPITAL_COMMUNITY): Payer: Self-pay | Admitting: Vascular Surgery

## 2013-02-28 ENCOUNTER — Ambulatory Visit (HOSPITAL_COMMUNITY): Payer: BC Managed Care – PPO

## 2013-02-28 ENCOUNTER — Encounter (HOSPITAL_COMMUNITY): Payer: Self-pay | Admitting: *Deleted

## 2013-02-28 ENCOUNTER — Inpatient Hospital Stay (HOSPITAL_COMMUNITY)
Admission: RE | Admit: 2013-02-28 | Discharge: 2013-03-02 | DRG: 757 | Disposition: A | Discharge status: Home / Self Care | Payer: BC Managed Care – PPO | Source: Ambulatory Visit | Attending: Neurosurgery | Admitting: Neurosurgery

## 2013-02-28 DIAGNOSIS — IMO0002 Reserved for concepts with insufficient information to code with codable children: Secondary | ICD-10-CM

## 2013-02-28 DIAGNOSIS — N189 Chronic kidney disease, unspecified: Secondary | ICD-10-CM | POA: Diagnosis present

## 2013-02-28 DIAGNOSIS — Z954 Presence of other heart-valve replacement: Secondary | ICD-10-CM

## 2013-02-28 DIAGNOSIS — I359 Nonrheumatic aortic valve disorder, unspecified: Secondary | ICD-10-CM | POA: Diagnosis present

## 2013-02-28 DIAGNOSIS — I129 Hypertensive chronic kidney disease with stage 1 through stage 4 chronic kidney disease, or unspecified chronic kidney disease: Secondary | ICD-10-CM | POA: Diagnosis present

## 2013-02-28 DIAGNOSIS — M48062 Spinal stenosis, lumbar region with neurogenic claudication: Principal | ICD-10-CM | POA: Diagnosis present

## 2013-02-28 DIAGNOSIS — I079 Rheumatic tricuspid valve disease, unspecified: Secondary | ICD-10-CM | POA: Diagnosis present

## 2013-02-28 DIAGNOSIS — Z7901 Long term (current) use of anticoagulants: Secondary | ICD-10-CM

## 2013-02-28 DIAGNOSIS — E119 Type 2 diabetes mellitus without complications: Secondary | ICD-10-CM | POA: Diagnosis present

## 2013-02-28 DIAGNOSIS — E669 Obesity, unspecified: Secondary | ICD-10-CM | POA: Diagnosis present

## 2013-02-28 DIAGNOSIS — E785 Hyperlipidemia, unspecified: Secondary | ICD-10-CM | POA: Diagnosis present

## 2013-02-28 DIAGNOSIS — Z6841 Body Mass Index (BMI) 40.0 and over, adult: Secondary | ICD-10-CM

## 2013-02-28 HISTORY — PX: LUMBAR LAMINECTOMY/DECOMPRESSION MICRODISCECTOMY: SHX5026

## 2013-02-28 LAB — GLUCOSE, CAPILLARY
Glucose-Capillary: 99 mg/dL (ref 70–99)
Glucose-Capillary: 116 mg/dL — ABNORMAL HIGH (ref 70–99)

## 2013-02-28 LAB — PROTIME-INR
INR: 1.06 (ref 0.00–1.49)
Prothrombin Time: 13.7 seconds (ref 11.6–15.2)

## 2013-02-28 LAB — APTT: aPTT: 31 seconds (ref 24–37)

## 2013-02-28 SURGERY — LUMBAR LAMINECTOMY/DECOMPRESSION MICRODISCECTOMY 2 LEVELS
Anesthesia: General | Site: Back | Wound class: Clean

## 2013-02-28 MED ORDER — OXYCODONE-ACETAMINOPHEN 5-325 MG PO TABS
1.0000 | ORAL_TABLET | ORAL | Status: DC | PRN
Start: 2013-02-28 — End: 2013-03-02
  Administered 2013-02-28 – 2013-03-01 (×4): 2 via ORAL
  Filled 2013-02-28 (×4): qty 2

## 2013-02-28 MED ORDER — BACITRACIN 50000 UNITS IM SOLR
INTRAMUSCULAR | Status: AC
  Filled 2013-02-28: qty 1

## 2013-02-28 MED ORDER — FENOFIBRATE 160 MG PO TABS
160.0000 mg | ORAL_TABLET | Freq: Every day | ORAL | Status: DC
  Administered 2013-03-01 – 2013-03-02 (×3): 160 mg via ORAL
  Filled 2013-02-28 (×4): qty 1

## 2013-02-28 MED ORDER — FENTANYL CITRATE 0.05 MG/ML IJ SOLN
INTRAMUSCULAR | Status: DC | PRN
  Administered 2013-02-28: 250 ug via INTRAVENOUS
  Administered 2013-02-28 (×2): 50 ug via INTRAVENOUS

## 2013-02-28 MED ORDER — PROPOFOL 10 MG/ML IV BOLUS
INTRAVENOUS | Status: DC | PRN
  Administered 2013-02-28: 200 mg via INTRAVENOUS

## 2013-02-28 MED ORDER — HYDROMORPHONE HCL PF 1 MG/ML IJ SOLN
0.5000 mg | INTRAMUSCULAR | Status: DC | PRN
  Administered 2013-03-01: 1 mg via INTRAVENOUS
  Filled 2013-02-28: qty 1

## 2013-02-28 MED ORDER — SODIUM CHLORIDE 0.9 % IV SOLN
INTRAVENOUS | Status: AC
  Filled 2013-02-28: qty 500

## 2013-02-28 MED ORDER — LACTATED RINGERS IV SOLN
INTRAVENOUS | Status: DC | PRN
  Administered 2013-02-28 (×2): via INTRAVENOUS

## 2013-02-28 MED ORDER — LORATADINE 10 MG PO TABS
10.0000 mg | ORAL_TABLET | Freq: Every day | ORAL | Status: DC
  Administered 2013-03-01 – 2013-03-02 (×3): 10 mg via ORAL
  Filled 2013-02-28 (×4): qty 1

## 2013-02-28 MED ORDER — PHENOL 1.4 % MT LIQD: 1.0000 | OROMUCOSAL | Status: DC | PRN

## 2013-02-28 MED ORDER — ALBUTEROL SULFATE HFA 108 (90 BASE) MCG/ACT IN AERS: 2.0000 | INHALATION_SPRAY | Freq: Four times a day (QID) | RESPIRATORY_TRACT | Status: DC | PRN

## 2013-02-28 MED ORDER — MIDAZOLAM HCL 2 MG/2ML IJ SOLN: 0.5000 mg | Freq: Once | INTRAMUSCULAR | Status: DC | PRN

## 2013-02-28 MED ORDER — ONDANSETRON HCL 4 MG/2ML IJ SOLN
INTRAMUSCULAR | Status: DC | PRN
  Administered 2013-02-28: 4 mg via INTRAVENOUS

## 2013-02-28 MED ORDER — LISINOPRIL 10 MG PO TABS
10.0000 mg | ORAL_TABLET | Freq: Every day | ORAL | Status: DC
  Administered 2013-03-01 – 2013-03-02 (×3): 10 mg via ORAL
  Filled 2013-02-28 (×4): qty 1

## 2013-02-28 MED ORDER — ADULT MULTIVITAMIN W/MINERALS CH
1.0000 | ORAL_TABLET | Freq: Every day | ORAL | Status: DC
  Administered 2013-03-01 – 2013-03-02 (×3): 1 via ORAL
  Filled 2013-02-28 (×4): qty 1

## 2013-02-28 MED ORDER — OXYCODONE HCL 5 MG/5ML PO SOLN: 5.0000 mg | Freq: Once | ORAL | Status: DC | PRN

## 2013-02-28 MED ORDER — DIAZEPAM 5 MG PO TABS
ORAL_TABLET | ORAL | Status: AC
  Filled 2013-02-28: qty 1

## 2013-02-28 MED ORDER — DIAZEPAM 5 MG PO TABS
5.0000 mg | ORAL_TABLET | Freq: Four times a day (QID) | ORAL | Status: DC | PRN
  Administered 2013-02-28 – 2013-03-02 (×5): 5 mg via ORAL
  Filled 2013-02-28 (×4): qty 1

## 2013-02-28 MED ORDER — BUPIVACAINE LIPOSOME 1.3 % IJ SUSP
20.0000 mL | Freq: Once | INTRAMUSCULAR | Status: AC
  Administered 2013-02-28: 20 mL
  Filled 2013-02-28: qty 20

## 2013-02-28 MED ORDER — ALUM & MAG HYDROXIDE-SIMETH 200-200-20 MG/5ML PO SUSP
30.0000 mL | Freq: Four times a day (QID) | ORAL | Status: DC | PRN
  Administered 2013-03-02: 30 mL via ORAL
  Filled 2013-02-28: qty 30

## 2013-02-28 MED ORDER — OXYCODONE-ACETAMINOPHEN 5-325 MG PO TABS
ORAL_TABLET | ORAL | Status: AC
  Administered 2013-02-28: 1
  Filled 2013-02-28: qty 2

## 2013-02-28 MED ORDER — THROMBIN 5000 UNITS EX SOLR
CUTANEOUS | Status: DC | PRN
  Administered 2013-02-28 (×2): 5000 [IU] via TOPICAL

## 2013-02-28 MED ORDER — ONDANSETRON HCL 4 MG/2ML IJ SOLN
4.0000 mg | INTRAMUSCULAR | Status: DC | PRN
  Administered 2013-02-28 – 2013-03-01 (×2): 4 mg via INTRAVENOUS
  Filled 2013-02-28 (×2): qty 2

## 2013-02-28 MED ORDER — OXYCODONE HCL 5 MG PO TABS: 5.0000 mg | ORAL_TABLET | Freq: Once | ORAL | Status: DC | PRN

## 2013-02-28 MED ORDER — ACETAMINOPHEN 325 MG PO TABS: 650.0000 mg | ORAL_TABLET | ORAL | Status: DC | PRN

## 2013-02-28 MED ORDER — GLYCOPYRROLATE 0.2 MG/ML IJ SOLN
INTRAMUSCULAR | Status: DC | PRN
  Administered 2013-02-28: 0.6 mg via INTRAVENOUS

## 2013-02-28 MED ORDER — FUROSEMIDE 20 MG PO TABS
20.0000 mg | ORAL_TABLET | Freq: Every day | ORAL | Status: DC
  Administered 2013-03-01 – 2013-03-02 (×3): 20 mg via ORAL
  Filled 2013-02-28 (×4): qty 1

## 2013-02-28 MED ORDER — ATORVASTATIN CALCIUM 40 MG PO TABS
40.0000 mg | ORAL_TABLET | Freq: Every day | ORAL | Status: DC
  Administered 2013-03-01 (×2): 40 mg via ORAL
  Filled 2013-02-28 (×4): qty 1

## 2013-02-28 MED ORDER — MENTHOL 3 MG MT LOZG: 1.0000 | LOZENGE | OROMUCOSAL | Status: DC | PRN

## 2013-02-28 MED ORDER — LACTATED RINGERS IV SOLN
INTRAVENOUS | Status: DC
  Administered 2013-02-28: 20:00:00 via INTRAVENOUS

## 2013-02-28 MED ORDER — BACITRACIN ZINC 500 UNIT/GM EX OINT
TOPICAL_OINTMENT | CUTANEOUS | Status: DC | PRN
  Administered 2013-02-28: 1 via TOPICAL

## 2013-02-28 MED ORDER — MIDAZOLAM HCL 5 MG/5ML IJ SOLN
INTRAMUSCULAR | Status: DC | PRN
  Administered 2013-02-28: 2 mg via INTRAVENOUS

## 2013-02-28 MED ORDER — HYDROMORPHONE HCL PF 1 MG/ML IJ SOLN
INTRAMUSCULAR | Status: DC | PRN
  Administered 2013-02-28 (×2): 0.5 mg via INTRAVENOUS

## 2013-02-28 MED ORDER — DOCUSATE SODIUM 100 MG PO CAPS
100.0000 mg | ORAL_CAPSULE | Freq: Two times a day (BID) | ORAL | Status: DC
  Administered 2013-02-28 – 2013-03-02 (×4): 100 mg via ORAL
  Filled 2013-02-28 (×4): qty 1

## 2013-02-28 MED ORDER — INSULIN ASPART 100 UNIT/ML ~~LOC~~ SOLN
0.0000 [IU] | SUBCUTANEOUS | Status: DC
  Administered 2013-03-01 (×2): 4 [IU] via SUBCUTANEOUS
  Administered 2013-03-01: 3 [IU] via SUBCUTANEOUS
  Administered 2013-03-01 (×2): 4 [IU] via SUBCUTANEOUS

## 2013-02-28 MED ORDER — HYDROCODONE-ACETAMINOPHEN 5-325 MG PO TABS
1.0000 | ORAL_TABLET | ORAL | Status: DC | PRN
  Administered 2013-03-01: 1 via ORAL
  Administered 2013-03-02: 2 via ORAL
  Filled 2013-02-28: qty 2
  Filled 2013-02-28: qty 1

## 2013-02-28 MED ORDER — ZOLPIDEM TARTRATE 5 MG PO TABS
5.0000 mg | ORAL_TABLET | Freq: Every day | ORAL | Status: DC
  Administered 2013-02-28 – 2013-03-01 (×2): 5 mg via ORAL
  Filled 2013-02-28 (×2): qty 1

## 2013-02-28 MED ORDER — 0.9 % SODIUM CHLORIDE (POUR BTL) OPTIME
TOPICAL | Status: DC | PRN
  Administered 2013-02-28: 1000 mL

## 2013-02-28 MED ORDER — ARTIFICIAL TEARS OP OINT
TOPICAL_OINTMENT | OPHTHALMIC | Status: DC | PRN
  Administered 2013-02-28: 1 via OPHTHALMIC

## 2013-02-28 MED ORDER — SODIUM CHLORIDE 0.9 % IR SOLN
Status: DC | PRN
  Administered 2013-02-28: 17:00:00

## 2013-02-28 MED ORDER — NEOSTIGMINE METHYLSULFATE 1 MG/ML IJ SOLN
INTRAMUSCULAR | Status: DC | PRN
  Administered 2013-02-28: 4 mg via INTRAVENOUS

## 2013-02-28 MED ORDER — METOPROLOL TARTRATE 25 MG PO TABS
25.0000 mg | ORAL_TABLET | Freq: Two times a day (BID) | ORAL | Status: DC
  Administered 2013-03-01 – 2013-03-02 (×4): 25 mg via ORAL
  Filled 2013-02-28 (×6): qty 1

## 2013-02-28 MED ORDER — CEFAZOLIN SODIUM-DEXTROSE 2-3 GM-% IV SOLR
INTRAVENOUS | Status: AC
  Filled 2013-02-28: qty 50

## 2013-02-28 MED ORDER — PROMETHAZINE HCL 25 MG/ML IJ SOLN: 6.2500 mg | INTRAMUSCULAR | Status: DC | PRN

## 2013-02-28 MED ORDER — HYDROMORPHONE HCL PF 1 MG/ML IJ SOLN
INTRAMUSCULAR | Status: AC
  Filled 2013-02-28: qty 1

## 2013-02-28 MED ORDER — GLIMEPIRIDE 2 MG PO TABS
2.0000 mg | ORAL_TABLET | Freq: Every day | ORAL | Status: DC
  Administered 2013-03-01: 2 mg via ORAL
  Filled 2013-02-28 (×4): qty 1

## 2013-02-28 MED ORDER — BUPIVACAINE-EPINEPHRINE PF 0.5-1:200000 % IJ SOLN
INTRAMUSCULAR | Status: DC | PRN
  Administered 2013-02-28: 10 mL

## 2013-02-28 MED ORDER — HYDROMORPHONE HCL PF 1 MG/ML IJ SOLN
0.2500 mg | INTRAMUSCULAR | Status: DC | PRN
  Administered 2013-02-28 (×4): 0.5 mg via INTRAVENOUS

## 2013-02-28 MED ORDER — CEFAZOLIN SODIUM-DEXTROSE 2-3 GM-% IV SOLR
2.0000 g | Freq: Three times a day (TID) | INTRAVENOUS | Status: AC
  Administered 2013-02-28 – 2013-03-01 (×2): 2 g via INTRAVENOUS
  Filled 2013-02-28 (×2): qty 50

## 2013-02-28 MED ORDER — ACETAMINOPHEN 650 MG RE SUPP: 650.0000 mg | RECTAL | Status: DC | PRN

## 2013-02-28 MED ORDER — ROCURONIUM BROMIDE 100 MG/10ML IV SOLN
INTRAVENOUS | Status: DC | PRN
  Administered 2013-02-28 (×2): 10 mg via INTRAVENOUS
  Administered 2013-02-28: 50 mg via INTRAVENOUS

## 2013-02-28 MED ORDER — FERROUS SULFATE 325 (65 FE) MG PO TABS
325.0000 mg | ORAL_TABLET | Freq: Two times a day (BID) | ORAL | Status: DC
  Administered 2013-03-01 – 2013-03-02 (×4): 325 mg via ORAL
  Filled 2013-02-28 (×6): qty 1

## 2013-02-28 MED ORDER — HEMOSTATIC AGENTS (NO CHARGE) OPTIME
TOPICAL | Status: DC | PRN
  Administered 2013-02-28: 1 via TOPICAL

## 2013-02-28 MED ORDER — MEPERIDINE HCL 25 MG/ML IJ SOLN: 6.2500 mg | INTRAMUSCULAR | Status: DC | PRN

## 2013-02-28 SURGICAL SUPPLY — 56 items
APL SKNCLS STERI-STRIP NONHPOA (GAUZE/BANDAGES/DRESSINGS) ×1
BAG DECANTER FOR FLEXI CONT (MISCELLANEOUS) ×2 IMPLANT
BENZOIN TINCTURE PRP APPL 2/3 (GAUZE/BANDAGES/DRESSINGS) ×2 IMPLANT
BLADE SURG ROTATE 9660 (MISCELLANEOUS) IMPLANT
BRUSH SCRUB EZ PLAIN DRY (MISCELLANEOUS) ×2 IMPLANT
BUR ACORN 6.0 (BURR) ×2 IMPLANT
BUR MATCHSTICK NEURO 3.0 LAGG (BURR) ×2 IMPLANT
CANISTER SUCTION 2500CC (MISCELLANEOUS) ×2 IMPLANT
CLOTH BEACON ORANGE TIMEOUT ST (SAFETY) ×2 IMPLANT
CONT SPEC 4OZ CLIKSEAL STRL BL (MISCELLANEOUS) ×2 IMPLANT
DRAPE LAPAROTOMY 100X72X124 (DRAPES) ×2 IMPLANT
DRAPE MICROSCOPE LEICA (MISCELLANEOUS) ×2 IMPLANT
DRAPE POUCH INSTRU U-SHP 10X18 (DRAPES) ×2 IMPLANT
DRAPE SURG 17X23 STRL (DRAPES) ×8 IMPLANT
ELECT BLADE 4.0 EZ CLEAN MEGAD (MISCELLANEOUS) ×2
ELECT REM PT RETURN 9FT ADLT (ELECTROSURGICAL) ×2
ELECTRODE BLDE 4.0 EZ CLN MEGD (MISCELLANEOUS) ×1 IMPLANT
ELECTRODE REM PT RTRN 9FT ADLT (ELECTROSURGICAL) ×1 IMPLANT
GAUZE SPONGE 4X4 16PLY XRAY LF (GAUZE/BANDAGES/DRESSINGS) IMPLANT
GLOVE BIO SURGEON STRL SZ8.5 (GLOVE) IMPLANT
GLOVE BIOGEL PI IND STRL 6.5 (GLOVE) ×2 IMPLANT
GLOVE BIOGEL PI INDICATOR 6.5 (GLOVE) ×2
GLOVE EXAM NITRILE LRG STRL (GLOVE) IMPLANT
GLOVE EXAM NITRILE MD LF STRL (GLOVE) IMPLANT
GLOVE EXAM NITRILE XL STR (GLOVE) IMPLANT
GLOVE EXAM NITRILE XS STR PU (GLOVE) IMPLANT
GLOVE SS BIOGEL STRL SZ 8 (GLOVE) IMPLANT
GLOVE SUPERSENSE BIOGEL SZ 8 (GLOVE)
GLOVE SURG SS PI 6.5 STRL IVOR (GLOVE) ×6 IMPLANT
GLOVE SURG SS PI 8.0 STRL IVOR (GLOVE) ×4 IMPLANT
GLOVE SURG SS PI 8.5 STRL IVOR (GLOVE) ×2
GLOVE SURG SS PI 8.5 STRL STRW (GLOVE) ×2 IMPLANT
GOWN BRE IMP SLV AUR LG STRL (GOWN DISPOSABLE) ×4 IMPLANT
GOWN BRE IMP SLV AUR XL STRL (GOWN DISPOSABLE) ×4 IMPLANT
GOWN STRL REIN 2XL LVL4 (GOWN DISPOSABLE) IMPLANT
KIT BASIN OR (CUSTOM PROCEDURE TRAY) ×2 IMPLANT
KIT ROOM TURNOVER OR (KITS) ×2 IMPLANT
NEEDLE HYPO 21X1.5 SAFETY (NEEDLE) ×2 IMPLANT
NEEDLE HYPO 22GX1.5 SAFETY (NEEDLE) ×2 IMPLANT
NS IRRIG 1000ML POUR BTL (IV SOLUTION) ×2 IMPLANT
PACK LAMINECTOMY NEURO (CUSTOM PROCEDURE TRAY) ×2 IMPLANT
PAD ARMBOARD 7.5X6 YLW CONV (MISCELLANEOUS) ×6 IMPLANT
PATTIES SURGICAL .5 X1 (DISPOSABLE) IMPLANT
RUBBERBAND STERILE (MISCELLANEOUS) ×4 IMPLANT
SPONGE GAUZE 4X4 12PLY (GAUZE/BANDAGES/DRESSINGS) ×2 IMPLANT
SPONGE SURGIFOAM ABS GEL SZ50 (HEMOSTASIS) ×2 IMPLANT
STRIP CLOSURE SKIN 1/2X4 (GAUZE/BANDAGES/DRESSINGS) ×2 IMPLANT
SUT VIC AB 1 CT1 18XBRD ANBCTR (SUTURE) ×2 IMPLANT
SUT VIC AB 1 CT1 8-18 (SUTURE) ×4
SUT VIC AB 2-0 CP2 18 (SUTURE) ×4 IMPLANT
SYR 20CC LL (SYRINGE) ×2 IMPLANT
SYR 20ML ECCENTRIC (SYRINGE) ×2 IMPLANT
TAPE CLOTH SURG 4X10 WHT LF (GAUZE/BANDAGES/DRESSINGS) ×2 IMPLANT
TOWEL OR 17X24 6PK STRL BLUE (TOWEL DISPOSABLE) ×2 IMPLANT
TOWEL OR 17X26 10 PK STRL BLUE (TOWEL DISPOSABLE) ×2 IMPLANT
WATER STERILE IRR 1000ML POUR (IV SOLUTION) ×2 IMPLANT

## 2013-02-28 NOTE — Anesthesia Procedure Notes (Signed)
Procedure Name: Intubation Date/Time: 02/28/2013 3:42 PM Performed by: Trixie Deis A Pre-anesthesia Checklist: Patient identified, Emergency Drugs available, Suction available, Patient being monitored and Timeout performed Patient Re-evaluated:Patient Re-evaluated prior to inductionOxygen Delivery Method: Circle system utilized Preoxygenation: Pre-oxygenation with 100% oxygen Intubation Type: IV induction Ventilation: Mask ventilation without difficulty and Oral airway inserted - appropriate to patient size Laryngoscope Size: Mac and 3 Grade View: Grade II Tube type: Oral Tube size: 7.0 mm Number of attempts: 2 Airway Equipment and Method: Stylet and Bougie stylet Placement Confirmation: ETT inserted through vocal cords under direct vision,  breath sounds checked- equal and bilateral and positive ETCO2 Secured at: 21 cm Tube secured with: Tape Dental Injury: Teeth and Oropharynx as per pre-operative assessment

## 2013-02-28 NOTE — Op Note (Signed)
Brief history: The patient is a 62 year old white female has complained of back and leg pain consistent with neurogenic claudication. She has failed medical management and was worked up with a lumbar MRI. This demonstrated spinal stenosis at L3-4 and L4-5. I discussed the various treatment option with the patient including surgery. I described the surgical treatment of an L3-4-4 5 laminectomy. The patient has weighed the risks, benefits, and alternatives surgery and decided proceed with surgery.  Preoperative diagnosis: L3-4 and L4-5 spinal stenosis, lumbago, lumbar discopathy, neurogenic claudication  Postoperative diagnosis: The same  Procedure: L4 laminectomy with bilateral L3 laminotomies to treat his spinal stenosis at L3-4 and L4-5 using microdissection with decompression of the bilateral L4 and L5 nerve roots  Surgeon: Dr. Earle Gell  Asst.: Dr. Sherley Bounds  Anesthesia: Gen. endotracheal  Estimated blood loss: 50 cc  Drains: None  Complications: None  Description of procedure: The patient was brought to the operating room by the anesthesia team. General endotracheal anesthesia was induced. The patient was turned to the prone position on the Wilson frame. The patient's lumbosacral region was then prepared with Betadine scrub and Betadine solution. Sterile drapes were applied.  I then injected the area to be incised with Marcaine with epinephrine solution. I then used a scalpel to make a linear midline incision over the L3-4 and L4-5 intervertebral disc space. I then used electrocautery to perform a bilateral subperiosteal dissection exposing the spinous process and lamina of L3, L4 and L5. We obtained intraoperative radiograph to confirm our location. I then inserted the [facility] retractor for exposure. I then used Leksell nodule were to remove the spinous process of L4 and the caudal aspect of the C3 spinous process.  We then brought the operative microscope into the field. Under  its magnification and illumination we completed the microdissection. I used a high-speed drill to perform a laminotomy at L3 and L4 bilaterally. I then used a Kerrison punches to plate the laminectomy L4 and to widen the laminotomies at L3 bilaterally and removed the ligamentum flavum at L3-4 and L4-5. We then used microdissection to free up the thecal sac and the bilateral L3 and L4 nerve root from the epidural tissue. I then used a Kerrison punch to perform a foraminotomy at about the bilateral L3 and L4 nerve root. We then using the nerve root retractor to gently retract the thecal sac and the L3 and L4 nerve root medially. This exposed the intervertebral disc. We inspected the intervertebral disc at L3-4 and L4-5 bilaterally. There was no significant herniations.   I then palpated along the ventral surface of the thecal sac and along exit route of the bilateral L3 and L4 nerve root and noted that the neural structures were well decompressed. This completed the decompression.  We then obtained hemostasis using bipolar electrocautery. We irrigated the wound out with bacitracin solution. We then removed the retractor. We then reapproximated the patient's thoracolumbar fascia with interrupted #1 Vicryl suture. We then reapproximated the patient's subcutaneous tissue with interrupted 3-0 Vicryl suture. We then reapproximated patient's skin with Steri-Strips and benzoin. The was then coated with bacitracin ointment. The drapes were removed. The patient was subsequently returned to the supine position where they were extubated by the anesthesia team. The patient was then transported to the postanesthesia care unit in stable condition. All sponge instrument and needle counts were reportedly correct at the end of this case.

## 2013-02-28 NOTE — Transfer of Care (Signed)
Immediate Anesthesia Transfer of Care Note  Patient: [name]  Procedure(s) Performed: Procedure(s) with comments: LUMBAR LAMINECTOMY/DECOMPRESSION MICRODISCECTOMY 2 LEVELS (N/A) - Lumbar three-four and Lumbar four-five laminectomies  Patient Location: PACU  Anesthesia Type:General  Level of Consciousness: awake, alert  and oriented  Airway & Oxygen Therapy: Patient Spontanous Breathing and Patient connected to nasal cannula oxygen  Post-op Assessment: Report given to PACU RN and Post -op Vital signs reviewed and stable  Post vital signs: Reviewed and stable  Complications: No apparent anesthesia complications

## 2013-02-28 NOTE — H&P (Signed)
Subjective: The patient is a 62 year old white female who has complained of back buttocks and leg pain consistent with a lumbar radiculopathy. She has failed medical management and was worked up with a lumbar MRI. This demonstrated this degeneration and spinal stenosis at L3-4 and L4-5. I discussed the various treatment options with the patient including surgery. She has weighed the risks, benefits, and alternatives surgery and decided proceed with at L3-4 and L4-5 decompressive laminectomy.   Past Medical History  Diagnosis Date  . Diabetes mellitus   . Hyperlipidemia   . AS (aortic stenosis) 09/18/2012    2D Echo EF 55%-60%  . AI (aortic insufficiency)   . Tricuspid regurgitation   . PONV (postoperative nausea and vomiting)   . Cough   . Chronic kidney disease     renal insuffcentily  . Arthritis   . Obese   . Hypertension     dr Donetta Potts    Past Surgical History  Procedure Laterality Date  . Aortic valve replacement  04/01/2010    21-mm Sr Jude Regent ;mechanical valve   . Replacement  of ascending aortic aneurysm  04/01/2010    28-mm supracoronary Hemashield tube graft using deep hypothermic circulatory arrest  . Cardiac catheterization  02/09/2010  . Broken arm      wire in lt elbow    Allergies  Allergen Reactions  . Morphine And Related Other (See Comments)    BP drops    History  Substance Use Topics  . Smoking status: Never Smoker   . Smokeless tobacco: Never Used  . Alcohol Use: No    Family History  Problem Relation Age of Onset  . CAD    . Cancer - Prostate Father   . Cancer - Lung Maternal Grandfather   . Diabetes    . Hypertension     Prior to Admission medications   Medication Sig Start Date End Date Taking? Authorizing Provider  albuterol (PROVENTIL HFA;VENTOLIN HFA) 108 (90 BASE) MCG/ACT inhaler Inhale 2 puffs into the lungs every 6 (six) hours as needed for shortness of breath.    Yes Historical Provider, MD  ALPRAZolam Duanne Moron) 0.5 MG tablet  Take 0.5 mg by mouth at bedtime as needed for sleep.    Yes Historical Provider, MD  aspirin EC 81 MG tablet Take 81 mg by mouth at bedtime.   Yes Historical Provider, MD  b complex vitamins tablet Take 1 tablet by mouth daily.   Yes Historical Provider, MD  cetirizine (ZYRTEC) 10 MG tablet Take 10 mg by mouth daily.   Yes Historical Provider, MD  cyclobenzaprine (FLEXERIL) 10 MG tablet Take 10 mg by mouth 3 (three) times daily as needed for muscle spasms.   Yes Historical Provider, MD  Enoxaparin Sodium (LOVENOX [place]) Inject into the skin. Bridge   Yes Historical Provider, MD  fenofibrate (TRICOR) 145 MG tablet Take 145 mg by mouth at bedtime.   Yes Historical Provider, MD  ferrous sulfate 325 (65 FE) MG tablet Take 325 mg by mouth 2 (two) times daily.   Yes Historical Provider, MD  furosemide (LASIX) 20 MG tablet Take 20 mg by mouth daily.   Yes Historical Provider, MD  glimepiride (AMARYL) 2 MG tablet Take 2 mg by mouth daily before breakfast.   Yes Historical Provider, MD  HYDROcodone-acetaminophen (NORCO/VICODIN) 5-325 MG per tablet Take 1 tablet by mouth every 4 (four) hours as needed for pain.   Yes Historical Provider, MD  lisinopril (PRINIVIL,ZESTRIL) 10 MG tablet Take 10 mg by  mouth daily.   Yes Historical Provider, MD  metoprolol (LOPRESSOR) 50 MG tablet Take 25 mg by mouth 2 (two) times daily.   Yes Historical Provider, MD  Multiple Vitamin (MULTIVITAMIN WITH MINERALS) TABS Take 1 tablet by mouth daily.   Yes Historical Provider, MD  multivitamin-lutein (OCUVITE-LUTEIN) CAPS Take 1 capsule by mouth daily.   Yes Historical Provider, MD  Omega-3 Fatty Acids (FISH OIL) 1000 MG CAPS Take 1,000 mg by mouth daily.   Yes Historical Provider, MD  rosuvastatin (CRESTOR) 20 MG tablet Take 20 mg by mouth daily.   Yes Historical Provider, MD  warfarin (COUMADIN) 5 MG tablet Take 2.5-5 mg by mouth daily. Takes 5mg  daily, except takes 2.5mg  on Wednesdays.   Yes Historical Provider, MD  zolpidem  (AMBIEN) 10 MG tablet Take 10 mg by mouth at bedtime.    Yes Historical Provider, MD     Review of Systems  Positive ROS: As above  All other systems have been reviewed and were otherwise negative with the exception of those mentioned in the HPI and as above.  Objective: Vital signs in last 24 hours: Temp:  [98.2 F (36.8 C)] 98.2 F (36.8 C) (06/11 1257) Pulse Rate:  [83] 83 (06/11 1257) Resp:  [20] 20 (06/11 1257) BP: (112)/(64) 112/64 mmHg (06/11 1257) SpO2:  [99 %] 99 % (06/11 1257)  General Appearance: Alert, cooperative, no distress, appears stated age Head: Normocephalic, without obvious abnormality, atraumatic Eyes: PERRL, conjunctiva/corneas clear, EOM's intact, fundi benign, both eyes      Ears: Normal TM's and external ear canals, both ears Throat: Lips, mucosa, and tongue normal; teeth and gums normal Neck: Supple, symmetrical, trachea midline, no adenopathy; thyroid: No enlargement/tenderness/nodules; no carotid bruit or JVD Back: Symmetric, no curvature, ROM normal, no CVA tenderness Lungs: Clear to auscultation bilaterally, respirations unlabored Heart: Regular rate and rhythm, S1 and S2 normal, no murmur, rub or gallop Abdomen: Soft, non-tender, bowel sounds active all four quadrants, no masses, no organomegaly Extremities: Extremities normal, atraumatic, no cyanosis or edema Pulses: 2+ and symmetric all extremities Skin: Skin color, texture, turgor normal, no rashes or lesions  NEUROLOGIC:   Mental status: alert and oriented, no aphasia, good attention span, Fund of knowledge/ memory ok Motor Exam - grossly normal Sensory Exam - grossly normal Reflexes:  Coordination - grossly normal Gait - grossly normal Balance - grossly normal Cranial Nerves: I: smell Not tested  II: visual acuity  OS: Normal    OD: Normal   II: visual fields Full to confrontation  II: pupils Equal, round, reactive to light  III,VII: ptosis None  III,IV,VI: extraocular muscles  Full  ROM  V: mastication Normal  V: facial light touch sensation  Normal  V,VII: corneal reflex  Present  VII: facial muscle function - upper  Normal  VII: facial muscle function - lower Normal  VIII: hearing Not tested  IX: soft palate elevation  Normal  IX,X: gag reflex Present  XI: trapezius strength  5/5  XI: sternocleidomastoid strength 5/5  XI: neck flexion strength  5/5  XII: tongue strength  Normal    Data Review Lab Results  Component Value Date   WBC 6.9 02/22/2013   HGB 14.1 02/22/2013   HCT 43.8 02/22/2013   MCV 91.1 02/22/2013   PLT 289 02/22/2013   Lab Results  Component Value Date   NA 137 02/22/2013   K 4.1 02/22/2013   CL 100 02/22/2013   CO2 28 02/22/2013   BUN 23 02/22/2013  CREATININE 1.35* 02/22/2013   GLUCOSE 102* 02/22/2013   Lab Results  Component Value Date   INR 1.06 02/28/2013    Assessment/Plan: L3-4 and L4-5 spinal stenosis, lumbago, lumbar radiculopathy, neurogenic claudication: I discussed the situation with the patient. I reviewed her imaging studies with her and pointed out the abnormalities. We have discussed the various treatment options including surgery. I described the surgical treatment option of an L3-4 and L4-5 decompressive laminectomy. I described the surgery to her. I've shown her surgical models. We have discussed the risks, benefits, alternatives, and likelihood of achieving our goals with surgery. I have answered all the patient's questions. She has decided to proceed with surgery.  Coumadin anticoagulation/St. Jude's cardiac valve: The patient has been off her Coumadin since 02/22/2013. We will resume her Coumadin as soon as possible.   Donjuan Robison D 02/28/2013 3:09 PM

## 2013-02-28 NOTE — Anesthesia Postprocedure Evaluation (Signed)
  Anesthesia Post-op Note  Patient: [name]  Procedure(s) Performed: Procedure(s) with comments: LUMBAR LAMINECTOMY/DECOMPRESSION MICRODISCECTOMY 2 LEVELS (N/A) - Lumbar three-four and Lumbar four-five laminectomies  Patient Location: PACU  Anesthesia Type:General  Level of Consciousness: awake and alert   Airway and Oxygen Therapy: Patient Spontanous Breathing  Post-op Pain: mild  Post-op Assessment: Post-op Vital signs reviewed, Patient's Cardiovascular Status Stable, Respiratory Function Stable, Patent Airway, No signs of Nausea or vomiting and Pain level controlled  Post-op Vital Signs: stable  Complications: No apparent anesthesia complications

## 2013-02-28 NOTE — Anesthesia Preprocedure Evaluation (Addendum)
Anesthesia Evaluation  Patient identified by MRN, date of birth, ID band Patient awake    Reviewed: Allergy & Precautions, H&P , NPO status , Patient's Chart, lab work & pertinent test results, reviewed documented beta blocker date and time   History of Anesthesia Complications (+) PONV  Airway Mallampati: II TM Distance: >3 FB Neck ROM: Full    Dental  (+) Teeth Intact and Dental Advisory Given   Pulmonary neg pulmonary ROS,  breath sounds clear to auscultation  Pulmonary exam normal       Cardiovascular hypertension, Pt. on medications and Pt. on home beta blockers + Valvular Problems/Murmurs (s/p St Jude AVR, lovenox bridge, INR 1.06) Rhythm:Regular Rate:Normal  12/13 ECHO: EF 55-60%, AVA 1.4   Neuro/Psych negative neurological ROS     GI/Hepatic negative GI ROS, Neg liver ROS,   Endo/Other  diabetes (glu 99), Well Controlled, Type 2, Oral Hypoglycemic AgentsMorbid obesity  Renal/GU Renal InsufficiencyRenal disease (creat 1.35)     Musculoskeletal   Abdominal (+) + obese,   Peds  Hematology   Anesthesia Other Findings   Reproductive/Obstetrics                         Anesthesia Physical Anesthesia Plan  ASA: III  Anesthesia Plan: General   Post-op Pain Management:    Induction: Intravenous  Airway Management Planned: Oral ETT  Additional Equipment:   Intra-op Plan:   Post-operative Plan: Extubation in OR  Informed Consent: I have reviewed the patients History and Physical, chart, labs and discussed the procedure including the risks, benefits and alternatives for the proposed anesthesia with the patient or authorized representative who has indicated his/her understanding and acceptance.   Dental advisory given  Plan Discussed with: CRNA and Surgeon  Anesthesia Plan Comments: (Plan routine monitors, GETA)        Anesthesia Quick Evaluation

## 2013-03-01 ENCOUNTER — Encounter (HOSPITAL_COMMUNITY): Payer: Self-pay | Admitting: Neurosurgery

## 2013-03-01 ENCOUNTER — Other Ambulatory Visit: Payer: Self-pay | Admitting: *Deleted

## 2013-03-01 LAB — GLUCOSE, CAPILLARY
Glucose-Capillary: 114 mg/dL — ABNORMAL HIGH (ref 70–99)
Glucose-Capillary: 157 mg/dL — ABNORMAL HIGH (ref 70–99)
Glucose-Capillary: 159 mg/dL — ABNORMAL HIGH (ref 70–99)
Glucose-Capillary: 170 mg/dL — ABNORMAL HIGH (ref 70–99)
Glucose-Capillary: 172 mg/dL — ABNORMAL HIGH (ref 70–99)
Glucose-Capillary: 174 mg/dL — ABNORMAL HIGH (ref 70–99)
Glucose-Capillary: 183 mg/dL — ABNORMAL HIGH (ref 70–99)

## 2013-03-01 MED ORDER — FENOFIBRATE 145 MG PO TABS: 145.0000 mg | ORAL_TABLET | Freq: Every day | ORAL | Status: DC

## 2013-03-01 NOTE — Progress Notes (Signed)
Pt was nauseated over night, pt began to slowly eat this morning and asked to wait to ambulate until later in the day.  Will pass on to oncoming nurse.

## 2013-03-01 NOTE — Progress Notes (Signed)
Patient ID: [name], female   DOB: 13-Aug-1951, 62 y.o.   MRN: [id number] Subjective:  The patient is alert and pleasant. Her back is sore.  Objective: Vital signs in last 24 hours: Temp:  [97.9 F (36.6 C)-98.8 F (37.1 C)] 98.8 F (37.1 C) (06/12 1037) Pulse Rate:  [83-109] 86 (06/12 1037) Resp:  [13-20] 20 (06/12 0237) BP: (105-168)/(46-80) 105/46 mmHg (06/12 1037) SpO2:  [94 %-100 %] 96 % (06/12 1037) Weight:  [116.166 kg (256 lb 1.6 oz)] 116.166 kg (256 lb 1.6 oz) (06/11 2006)  Intake/Output from previous day: 06/11 0701 - 06/12 0700 In: 1500 [I.V.:1500] Out: 75 [Blood:75] Intake/Output this shift: Total I/O In: 240 [P.O.:240] Out: -   Physical exam patient is alert and oriented. She is moving her lower extremities well. Her dressing is scattered but dry.  Lab Results: No results found for this basename: WBC, HGB, HCT, PLT,  in the last 72 hours BMET No results found for this basename: NA, K, CL, CO2, GLUCOSE, BUN, CREATININE, CALCIUM,  in the last 72 hours  Studies/Results: Dg Lumbar Spine 1 View  02/28/2013   *RADIOLOGY REPORT*  Clinical Data:  62 year old female undergoing lumbar surgery.  LUMBAR SPINE - 1 VIEW  Comparison: [facility] lumbar MRI 06/30/2012.  Findings: Portable cross-table lateral intraoperative view of the lumbar spine labeled film at 1625 hours.  Same numbering system used as on comparison.  Surgical probe directed at the superior L5 endplate.  IMPRESSION: Intraoperative localization at the superior L5 level.   Original Report Authenticated By: Roselyn Reef, M.D.    Assessment/Plan: Postop day 1: We will mobilize the patient. She will likely go home tomorrow.  LOS: 1 day     Toyna Erisman D 03/01/2013, 11:47 AM

## 2013-03-02 LAB — GLUCOSE, CAPILLARY: Glucose-Capillary: 108 mg/dL — ABNORMAL HIGH (ref 70–99)

## 2013-03-02 MED ORDER — OXYCODONE-ACETAMINOPHEN 10-325 MG PO TABS: 1.0000 | ORAL_TABLET | ORAL | Status: DC | PRN

## 2013-03-02 MED ORDER — DIAZEPAM 5 MG PO TABS: 5.0000 mg | ORAL_TABLET | Freq: Four times a day (QID) | ORAL | Status: DC | PRN

## 2013-03-02 MED ORDER — DSS 100 MG PO CAPS: 100.0000 mg | ORAL_CAPSULE | Freq: Two times a day (BID) | ORAL | Status: DC

## 2013-03-02 NOTE — Care Management Note (Signed)
    Page 1 of 1   03/02/2013     11:48:23 AM   CARE MANAGEMENT NOTE 03/02/2013  Patient:  [name], [name]   Account Number:  [id number]  Date Initiated:  03/01/2013  Documentation initiated by:  [facility]  Subjective/Objective Assessment:   admitted postop L4 laminectomy     Action/Plan:   plan return home   Anticipated DC Date:  03/03/2013   Anticipated DC Plan:  [place]  CM consult      Choice offered to / List presented to:             Status of service:  In process, will continue to follow Medicare Important Message given?   (If response is "NO", the following Medicare IM given date fields will be blank) Date Medicare IM given:   Date Additional Medicare IM given:    Discharge Disposition:    Per UR Regulation:  Reviewed for med. necessity/level of care/duration of stay  If discussed at [place] of Stay Meetings, dates discussed:    Comments:  03/02/13 [place], MSN CM-  Spoke with Larkin Ina [facility] DME regarding patient's need for rolling walker prior to discharge.

## 2013-03-02 NOTE — Discharge Summary (Signed)
Physician Discharge Summary  Patient ID: [name] MRN: [id number] DOB/AGE: March 26, 1951 62 y.o.  Admit date: 02/28/2013 Discharge date: 03/02/2013  Admission Diagnoses: L3-4 and L4-5 spinal stenosis, lumbago, lumbar radiculopathy, neurogenic claudication  Discharge Diagnoses: The same Active Problems:   * No active hospital problems. *   Discharged Condition: good  Hospital Course: I admitted the patient to [facility] [place] on 02/28/2013. On that day I performed at L3-4 and L4-5 decompressive laminectomy. The surgery went well.  The patient's postoperative course was unremarkable. PT and OT work with the patient. 03/02/2013 the patient felt well and requested discharge to home. She was given oral and written discharge instructions. All her questions were answered.  Consults: PT and OT Significant Diagnostic Studies: None Treatments: L3-4 and L4-5 decompressive laminectomy using microdissection Discharge Exam: Blood pressure 124/60, pulse 92, temperature 98.3 F (36.8 C), temperature source Oral, resp. rate 18, height 5\' 2"  (1.575 m), weight 116.166 kg (256 lb 1.6 oz), SpO2 94.00%. The patient is alert and oriented. Her strength is normal in her lower extremities. Her dressing is clean and dry.  Disposition: Home     Medication List    STOP taking these medications       cyclobenzaprine 10 MG tablet  Commonly known as:  FLEXERIL     HYDROcodone-acetaminophen 5-325 MG per tablet  Commonly known as:  NORCO/VICODIN     LOVENOX [place]      TAKE these medications       albuterol 108 (90 BASE) MCG/ACT inhaler  Commonly known as:  PROVENTIL HFA;VENTOLIN HFA  Inhale 2 puffs into the lungs every 6 (six) hours as needed for shortness of breath.     ALPRAZolam 0.5 MG tablet  Commonly known as:  XANAX  Take 0.5 mg by mouth at bedtime as needed for sleep.     aspirin EC 81 MG tablet  Take 81 mg by mouth at bedtime.     b complex vitamins tablet  Take 1 tablet by  mouth daily.     cetirizine 10 MG tablet  Commonly known as:  ZYRTEC  Take 10 mg by mouth daily.     diazepam 5 MG tablet  Commonly known as:  VALIUM  Take 1 tablet (5 mg total) by mouth every 6 (six) hours as needed.     DSS 100 MG Caps  Take 100 mg by mouth 2 (two) times daily.     fenofibrate 145 MG tablet  Commonly known as:  TRICOR  Take 1 tablet (145 mg total) by mouth at bedtime.     ferrous sulfate 325 (65 FE) MG tablet  Take 325 mg by mouth 2 (two) times daily.     Fish Oil 1000 MG Caps  Take 1,000 mg by mouth daily.     furosemide 20 MG tablet  Commonly known as:  LASIX  Take 20 mg by mouth daily.     glimepiride 2 MG tablet  Commonly known as:  AMARYL  Take 2 mg by mouth daily before breakfast.     lisinopril 10 MG tablet  Commonly known as:  PRINIVIL,ZESTRIL  Take 10 mg by mouth daily.     metoprolol 50 MG tablet  Commonly known as:  LOPRESSOR  Take 25 mg by mouth 2 (two) times daily.     multivitamin with minerals Tabs  Take 1 tablet by mouth daily.     multivitamin-lutein Caps  Take 1 capsule by mouth daily.     oxyCODONE-acetaminophen 10-325 MG  per tablet  Commonly known as:  PERCOCET  Take 1 tablet by mouth every 4 (four) hours as needed for pain.     rosuvastatin 20 MG tablet  Commonly known as:  CRESTOR  Take 20 mg by mouth daily.     warfarin 5 MG tablet  Commonly known as:  COUMADIN  Take 2.5-5 mg by mouth daily. Takes 5mg  daily, except takes 2.5mg  on Wednesdays.     zolpidem 10 MG tablet  Commonly known as:  AMBIEN  Take 10 mg by mouth at bedtime.         SignedOphelia Charter 03/02/2013, 7:40 AM

## 2013-03-05 ENCOUNTER — Other Ambulatory Visit: Payer: Self-pay | Admitting: *Deleted

## 2013-03-05 MED ORDER — METOPROLOL TARTRATE 50 MG PO TABS: 25.0000 mg | ORAL_TABLET | Freq: Two times a day (BID) | ORAL | Status: DC

## 2013-03-09 ENCOUNTER — Other Ambulatory Visit: Payer: Self-pay | Admitting: *Deleted

## 2013-03-09 NOTE — Telephone Encounter (Signed)
Refill request for warfarin received.  Call to pt to clarify dosage and left message to call back Monday.  Message forwarded to K. Alvstad, PharmD.  [place] pt: Last OV note and request in PharmD's inbox.

## 2013-03-10 MED ORDER — WARFARIN SODIUM 5 MG PO TABS: 2.5000 mg | ORAL_TABLET | Freq: Every day | ORAL | Status: DC

## 2013-03-12 ENCOUNTER — Other Ambulatory Visit: Payer: Self-pay

## 2013-03-12 ENCOUNTER — Other Ambulatory Visit: Payer: Self-pay | Admitting: Pharmacist Clinician (PhC)/ Clinical Pharmacy Specialist

## 2013-03-12 MED ORDER — WARFARIN SODIUM 5 MG PO TABS: 2.5000 mg | ORAL_TABLET | Freq: Every day | ORAL | Status: DC

## 2013-03-12 MED ORDER — ROSUVASTATIN CALCIUM 20 MG PO TABS: 20.0000 mg | ORAL_TABLET | Freq: Every day | ORAL | Status: DC

## 2013-03-12 NOTE — Telephone Encounter (Signed)
Rx was sent to pharmacy electronically. 

## 2013-04-02 ENCOUNTER — Other Ambulatory Visit: Payer: Self-pay | Admitting: *Deleted

## 2013-04-02 MED ORDER — FUROSEMIDE 20 MG PO TABS: 20.0000 mg | ORAL_TABLET | Freq: Every day | ORAL | Status: DC

## 2013-04-02 NOTE — Telephone Encounter (Signed)
Rx was sent to pharmacy electronically. 

## 2013-05-28 ENCOUNTER — Telehealth: Payer: Self-pay | Admitting: *Deleted

## 2013-05-28 MED ORDER — ROSUVASTATIN CALCIUM 20 MG PO TABS: 20.0000 mg | ORAL_TABLET | Freq: Every day | ORAL | Status: DC

## 2013-05-28 NOTE — Telephone Encounter (Signed)
Patient will call back with dosage.

## 2013-05-28 NOTE — Telephone Encounter (Signed)
Rx was sent to pharmacy electronically. 

## 2013-06-05 ENCOUNTER — Telehealth: Payer: Self-pay | Admitting: Pharmacist Clinician (PhC)/ Clinical Pharmacy Specialist

## 2013-06-05 NOTE — Telephone Encounter (Signed)
Overdue INR letter sent

## 2013-06-28 ENCOUNTER — Telehealth: Payer: Self-pay | Admitting: Pharmacist Clinician (PhC)/ Clinical Pharmacy Specialist

## 2013-06-28 NOTE — Telephone Encounter (Signed)
Will pass information to [name] at [facility] office.  Pt aware

## 2013-06-28 NOTE — Telephone Encounter (Signed)
Wants to know if she can have her coumadin at [facility] in [place]?

## 2013-06-29 ENCOUNTER — Ambulatory Visit: Payer: Self-pay | Admitting: Pharmacist Clinician (PhC)/ Clinical Pharmacy Specialist

## 2013-06-29 DIAGNOSIS — Z7901 Long term (current) use of anticoagulants: Secondary | ICD-10-CM

## 2013-07-16 ENCOUNTER — Ambulatory Visit (INDEPENDENT_AMBULATORY_CARE_PROVIDER_SITE_OTHER): Payer: BC Managed Care – PPO | Admitting: *Deleted

## 2013-07-16 DIAGNOSIS — Z954 Presence of other heart-valve replacement: Secondary | ICD-10-CM

## 2013-07-16 DIAGNOSIS — Z7901 Long term (current) use of anticoagulants: Secondary | ICD-10-CM

## 2013-07-16 DIAGNOSIS — Z952 Presence of prosthetic heart valve: Secondary | ICD-10-CM

## 2013-07-16 LAB — POCT INR: INR: 3.7

## 2013-07-30 ENCOUNTER — Other Ambulatory Visit: Payer: Self-pay | Admitting: *Deleted

## 2013-07-30 MED ORDER — FENOFIBRATE 145 MG PO TABS: 145.0000 mg | ORAL_TABLET | Freq: Every day | ORAL | Status: DC

## 2013-08-06 ENCOUNTER — Encounter: Payer: Self-pay | Admitting: Cardiovascular Disease

## 2013-08-06 ENCOUNTER — Ambulatory Visit (INDEPENDENT_AMBULATORY_CARE_PROVIDER_SITE_OTHER): Payer: BC Managed Care – PPO | Admitting: Cardiovascular Disease

## 2013-08-06 ENCOUNTER — Ambulatory Visit (INDEPENDENT_AMBULATORY_CARE_PROVIDER_SITE_OTHER): Payer: BC Managed Care – PPO | Admitting: *Deleted

## 2013-08-06 VITALS — BP 132/76 | HR 76 | Resp 20 | Ht 62.0 in | Wt 253.1 lb

## 2013-08-06 DIAGNOSIS — Z952 Presence of prosthetic heart valve: Secondary | ICD-10-CM

## 2013-08-06 DIAGNOSIS — Z79899 Other long term (current) drug therapy: Secondary | ICD-10-CM

## 2013-08-06 DIAGNOSIS — N183 Chronic kidney disease, stage 3 unspecified: Secondary | ICD-10-CM

## 2013-08-06 DIAGNOSIS — E782 Mixed hyperlipidemia: Secondary | ICD-10-CM

## 2013-08-06 DIAGNOSIS — D649 Anemia, unspecified: Secondary | ICD-10-CM | POA: Insufficient documentation

## 2013-08-06 DIAGNOSIS — Z7901 Long term (current) use of anticoagulants: Secondary | ICD-10-CM

## 2013-08-06 DIAGNOSIS — Z954 Presence of other heart-valve replacement: Secondary | ICD-10-CM

## 2013-08-06 LAB — CBC
HCT: 41.9 % (ref 36.0–46.0)
Hemoglobin: 14.1 g/dL (ref 12.0–15.0)
MCH: 29.9 pg (ref 26.0–34.0)
MCHC: 33.7 g/dL (ref 30.0–36.0)
MCV: 89 fL (ref 78.0–100.0)
Platelets: 286 10*3/uL (ref 150–400)
RBC: 4.71 MIL/uL (ref 3.87–5.11)
RDW: 14.6 % (ref 11.5–15.5)
WBC: 6.7 10*3/uL (ref 4.0–10.5)

## 2013-08-06 LAB — POCT INR: INR: 2.9

## 2013-08-06 LAB — LIPID PANEL
Cholesterol: 168 mg/dL (ref 0–200)
HDL: 54 mg/dL (ref >39)
LDL Cholesterol: 68 mg/dL (ref 0–99)
Total CHOL/HDL Ratio: 3.1 Ratio
Triglycerides: 229 mg/dL — ABNORMAL HIGH (ref <150)
VLDL: 46 mg/dL — ABNORMAL HIGH (ref 0–40)

## 2013-08-06 LAB — IRON AND TIBC
%SAT: 17 % — ABNORMAL LOW (ref 20–55)
Iron: 71 ug/dL (ref 42–145)
TIBC: 419 ug/dL (ref 250–470)
UIBC: 348 ug/dL (ref 125–400)

## 2013-08-06 LAB — FERRITIN: Ferritin: 286 ng/mL (ref 10–291)

## 2013-08-06 NOTE — Assessment & Plan Note (Signed)
She is now roughly 3 years status post her valve replacement and ascending aortic graft repair. There are no abnormalities of the valve by echocardiogram performed a year ago and by physical exam today. She has no symptoms of congestive heart failure despite the relatively small size of the prosthesis. The gradients were normal at her last echo. Follow up on a yearly basis.

## 2013-08-06 NOTE — Progress Notes (Signed)
Patient ID: [name], female   DOB: May 31, 1951, 62 y.o.   MRN: [id number]      Reason for office visit Aortic valve replacement/ascending aortic repair followup  In 2011 [name] underwent a Bentall procedure for bicuspid aortic valve with ascending aortic aneurysm. She received a mechanical 21 mm St. Jude Regent aortic valve prosthesis and a 28 mm hemashield graft. In June of this year her anticoagulants were briefly interrupted for lumbar spine surgery. The procedure occurred without complications and she has not had bleeding or clotting problems. She no longer has the severe numbness that she had in both her feet but is still requiring physical therapy to recover from the surgery. She is morbidly obese which makes for a slower recovery, I'm sure. She has mixed hyperlipidemia for she takes both a potent statin and fenofibrate. She has treated hypertension. She has had recurrent problems with iron deficiency and still takes an iron supplement   Allergies  Allergen Reactions  . Morphine And Related Other (See Comments)    BP drops    Current Outpatient Prescriptions  Medication Sig Dispense Refill  . albuterol (PROVENTIL HFA;VENTOLIN HFA) 108 (90 BASE) MCG/ACT inhaler Inhale 2 puffs into the lungs every 6 (six) hours as needed for shortness of breath.       . ALPRAZolam (XANAX) 0.5 MG tablet Take 0.5 mg by mouth at bedtime as needed for sleep.       Marland Kitchen aspirin EC 81 MG tablet Take 81 mg by mouth at bedtime.      Marland Kitchen b complex vitamins tablet Take 1 tablet by mouth daily.      . bisacodyl (DULCOLAX) 5 MG EC tablet Take 5 mg by mouth daily as needed for moderate constipation.      . cetirizine (ZYRTEC) 10 MG tablet Take 10 mg by mouth daily.      . cyclobenzaprine (FLEXERIL) 10 MG tablet Take 10 mg by mouth 3 (three) times daily as needed for muscle spasms.      Marland Kitchen docusate sodium 100 MG CAPS Take 100 mg by mouth 2 (two) times daily.  60 capsule  0  . fenofibrate (TRICOR) 145  MG tablet Take 1 tablet (145 mg total) by mouth at bedtime.  30 tablet  6  . ferrous sulfate 325 (65 FE) MG tablet Take 325 mg by mouth 2 (two) times daily.      . furosemide (LASIX) 20 MG tablet Take 1 tablet (20 mg total) by mouth daily.  30 tablet  6  . glimepiride (AMARYL) 2 MG tablet Take 2 mg by mouth daily before breakfast.      . HYDROcodone-acetaminophen (NORCO/VICODIN) 5-325 MG per tablet Take 1 tablet by mouth every 6 (six) hours as needed for moderate pain.      Marland Kitchen lisinopril (PRINIVIL,ZESTRIL) 10 MG tablet Take 10 mg by mouth daily.      . metoprolol (LOPRESSOR) 50 MG tablet Take 0.5 tablets (25 mg total) by mouth 2 (two) times daily.  30 tablet  6  . Multiple Vitamin (MULTIVITAMIN WITH MINERALS) TABS Take 1 tablet by mouth daily.      . multivitamin-lutein (OCUVITE-LUTEIN) CAPS Take 1 capsule by mouth daily.      . Omega-3 Fatty Acids (FISH OIL) 1000 MG CAPS Take 1,000 mg by mouth daily.      . rosuvastatin (CRESTOR) 20 MG tablet Take 1 tablet (20 mg total) by mouth daily.  30 tablet  8  . warfarin (COUMADIN) 5 MG tablet  Take 0.5-1 tablets (2.5-5 mg total) by mouth daily. Takes 5mg  daily, except takes 2.5mg  on Wednesdays.  30 tablet  3  . zolpidem (AMBIEN) 10 MG tablet Take 10 mg by mouth at bedtime.        No current facility-administered medications for this visit.    Past Medical History  Diagnosis Date  . Diabetes mellitus   . Hyperlipidemia   . AS (aortic stenosis) 09/18/2012    2D Echo EF 55%-60%  . AI (aortic insufficiency)   . Tricuspid regurgitation   . PONV (postoperative nausea and vomiting)   . Cough   . Chronic kidney disease     renal insuffcentily  . Arthritis   . Obese   . Hypertension     dr Donetta Potts    Past Surgical History  Procedure Laterality Date  . Aortic valve replacement  04/01/2010    21-mm Sr Jude Regent ;mechanical valve   . Replacement  of ascending aortic aneurysm  04/01/2010    28-mm supracoronary Hemashield tube graft using deep  hypothermic circulatory arrest  . Cardiac catheterization  02/09/2010  . Broken arm      wire in lt elbow  . Lumbar laminectomy/decompression microdiscectomy N/A 02/28/2013    Procedure: LUMBAR LAMINECTOMY/DECOMPRESSION MICRODISCECTOMY 2 LEVELS;  Surgeon: Ophelia Charter, MD;  Location: [facility];  Service: Neurosurgery;  Laterality: N/A;  Lumbar three-four and Lumbar four-five laminectomies    Family History  Problem Relation Age of Onset  . CAD    . Cancer - Prostate Father   . Cancer - Lung Maternal Grandfather   . Diabetes    . Hypertension      History   Social History  . Marital Status: Married    Spouse Name: N/A    Number of Children: N/A  . Years of Education: N/A   Occupational History  . Not on file.   Social History Main Topics  . Smoking status: Never Smoker   . Smokeless tobacco: Never Used  . Alcohol Use: No  . Drug Use: Not on file  . Sexual Activity: Not on file   Other Topics Concern  . Not on file   Social History Narrative  . No narrative on file    Review of systems: The patient specifically denies any chest pain at rest or with exertion, dyspnea at rest or with exertion, orthopnea, paroxysmal nocturnal dyspnea, syncope, palpitations, focal neurological deficits, intermittent claudication, lower extremity edema, unexplained weight gain, cough, hemoptysis or wheezing.  The patient also denies abdominal pain, nausea, vomiting, dysphagia, diarrhea, constipation, polyuria, polydipsia, dysuria, hematuria, frequency, urgency, abnormal bleeding or bruising, fever, chills, unexpected weight changes, mood swings, change in skin or hair texture, change in voice quality, auditory or visual problems, allergic reactions or rashes, new musculoskeletal complaints other than usual "aches and pains", especially in her lower back.   PHYSICAL EXAM BP 132/76  Pulse 76  Resp 20  Ht 5\' 2"  (1.575 m)  Wt 253 lb 1.6 oz (114.805 kg)  BMI 46.28 kg/m2  General:  Alert, oriented x3, no distress Head: no evidence of trauma, PERRL, EOMI, no exophtalmos or lid lag, no myxedema, no xanthelasma; normal ears, nose and oropharynx Neck: normal jugular venous pulsations and no hepatojugular reflux; brisk carotid pulses without delay and no carotid bruits Chest: clear to auscultation, no signs of consolidation by percussion or palpation, normal fremitus, symmetrical and full respiratory excursions Cardiovascular: normal position and quality of the apical impulse, regular rhythm, normal first and second  heart sounds, very crisp prostatic valve clicks to radiate all the upper carotids to the angle of her jaw, grade 1/6 early peaking systolic ejection murmur, no diastolic murmurs or rubs or gallops Abdomen: no tenderness or distention, no masses by palpation, no abnormal pulsatility or arterial bruits, normal bowel sounds, no hepatosplenomegaly Extremities: no clubbing, cyanosis or edema; 2+ radial, ulnar and brachial pulses bilaterally; 2+ right femoral, posterior tibial and dorsalis pedis pulses; 2+ left femoral, posterior tibial and dorsalis pedis pulses; no subclavian or femoral bruits Neurological: grossly nonfocal   EKG: Normal sinus rhythm, minimal voltage criteria for LVH (likely masked by obesity) with secondary repolarization changes especially obvious in leads 1 and aVL, most likely secondary to LVH. EKG is not changed from previous tracings. Notes that in the immediate postoperative care she had a complete left bundle branch block.  Lipid Panel  September 2013 total cholesterol 151, triglycerides 257, HDL 40, LDL 60, creatinine 1.55, BUN 25, normal liver function tests  BMET    Component Value Date/Time   NA 137 02/22/2013 0833   K 4.1 02/22/2013 0833   CL 100 02/22/2013 0833   CO2 28 02/22/2013 0833   GLUCOSE 102* 02/22/2013 0833   BUN 23 02/22/2013 0833   CREATININE 1.35* 02/22/2013 0833   CALCIUM 10.7* 02/22/2013 0833   GFRNONAA 41* 02/22/2013 0833   GFRAA 47*  02/22/2013 0833     ASSESSMENT AND PLAN Hx of mechanical aortic valve replacement She is now roughly 3 years status post her valve replacement and ascending aortic graft repair. There are no abnormalities of the valve by echocardiogram performed a year ago and by physical exam today. She has no symptoms of congestive heart failure despite the relatively small size of the prosthesis. The gradients were normal at her last echo. Follow up on a yearly basis.  Hypertension Adequate control  Hyperlipidemia Time to repeat her lipid profile and liver function tests.  Chronic kidney disease (CKD), stage III (moderate) Creatinine clearance is around 40 mL per minute and stable.   Patient Instructions  Your physician recommends that you return for lab work fasting.  Your physician recommends that you schedule a follow-up appointment in:1 YEAR. No changes were made today in your therapy.    No orders of the defined types were placed in this encounter.   Meds ordered this encounter  Medications  . HYDROcodone-acetaminophen (NORCO/VICODIN) 5-325 MG per tablet    Sig: Take 1 tablet by mouth every 6 (six) hours as needed for moderate pain.  . bisacodyl (DULCOLAX) 5 MG EC tablet    Sig: Take 5 mg by mouth daily as needed for moderate constipation.  . cyclobenzaprine (FLEXERIL) 10 MG tablet    Sig: Take 10 mg by mouth 3 (three) times daily as needed for muscle spasms.    Holli Humbles, MD, [place] [phone] office [phone] pager

## 2013-08-06 NOTE — Assessment & Plan Note (Signed)
Adequate control. 

## 2013-08-06 NOTE — Assessment & Plan Note (Signed)
Creatinine clearance is around 40 mL per minute and stable.

## 2013-08-06 NOTE — Assessment & Plan Note (Signed)
Time to repeat her lipid profile and liver function tests.

## 2013-08-06 NOTE — Patient Instructions (Signed)
Your physician recommends that you return for lab work fasting.  Your physician recommends that you schedule a follow-up appointment in:1 YEAR. No changes were made today in your therapy.

## 2013-08-07 ENCOUNTER — Encounter: Payer: Self-pay | Admitting: Cardiovascular Disease

## 2013-08-20 ENCOUNTER — Ambulatory Visit (INDEPENDENT_AMBULATORY_CARE_PROVIDER_SITE_OTHER): Payer: BC Managed Care – PPO | Admitting: *Deleted

## 2013-08-20 DIAGNOSIS — Z7901 Long term (current) use of anticoagulants: Secondary | ICD-10-CM

## 2013-08-20 DIAGNOSIS — Z952 Presence of prosthetic heart valve: Secondary | ICD-10-CM

## 2013-08-20 DIAGNOSIS — Z954 Presence of other heart-valve replacement: Secondary | ICD-10-CM

## 2013-08-20 LAB — POCT INR: INR: 3.3

## 2013-09-19 ENCOUNTER — Ambulatory Visit (INDEPENDENT_AMBULATORY_CARE_PROVIDER_SITE_OTHER): Payer: BC Managed Care – PPO | Admitting: *Deleted

## 2013-09-19 DIAGNOSIS — Z954 Presence of other heart-valve replacement: Secondary | ICD-10-CM

## 2013-09-19 DIAGNOSIS — Z952 Presence of prosthetic heart valve: Secondary | ICD-10-CM

## 2013-09-19 DIAGNOSIS — Z7901 Long term (current) use of anticoagulants: Secondary | ICD-10-CM

## 2013-09-19 LAB — POCT INR: INR: 2.5

## 2013-10-01 ENCOUNTER — Other Ambulatory Visit: Payer: Self-pay | Admitting: Cardiovascular Disease

## 2013-10-01 NOTE — Telephone Encounter (Signed)
Rx was sent to pharmacy electronically. 

## 2013-10-17 ENCOUNTER — Ambulatory Visit (INDEPENDENT_AMBULATORY_CARE_PROVIDER_SITE_OTHER): Payer: BC Managed Care – PPO | Admitting: *Deleted

## 2013-10-17 ENCOUNTER — Encounter (INDEPENDENT_AMBULATORY_CARE_PROVIDER_SITE_OTHER): Payer: Self-pay

## 2013-10-17 DIAGNOSIS — Z952 Presence of prosthetic heart valve: Secondary | ICD-10-CM

## 2013-10-17 DIAGNOSIS — Z7901 Long term (current) use of anticoagulants: Secondary | ICD-10-CM

## 2013-10-17 DIAGNOSIS — Z5181 Encounter for therapeutic drug level monitoring: Secondary | ICD-10-CM | POA: Insufficient documentation

## 2013-10-17 DIAGNOSIS — Z954 Presence of other heart-valve replacement: Secondary | ICD-10-CM

## 2013-10-17 LAB — POCT INR: INR: 2.3

## 2013-11-03 ENCOUNTER — Other Ambulatory Visit: Payer: Self-pay | Admitting: Cardiovascular Disease

## 2013-11-05 NOTE — Telephone Encounter (Signed)
Rx was sent to pharmacy electronically. 

## 2013-11-19 ENCOUNTER — Other Ambulatory Visit (HOSPITAL_COMMUNITY): Payer: Self-pay | Admitting: Internal Medicine

## 2013-11-19 DIAGNOSIS — Z1231 Encounter for screening mammogram for malignant neoplasm of breast: Secondary | ICD-10-CM

## 2013-11-20 ENCOUNTER — Ambulatory Visit (HOSPITAL_COMMUNITY)
Admission: RE | Admit: 2013-11-20 | Discharge: 2013-11-20 | Disposition: A | Discharge status: Home / Self Care | Payer: BC Managed Care – PPO | Source: Ambulatory Visit | Attending: Internal Medicine | Admitting: Internal Medicine

## 2013-11-20 DIAGNOSIS — Z1231 Encounter for screening mammogram for malignant neoplasm of breast: Secondary | ICD-10-CM

## 2013-11-22 ENCOUNTER — Ambulatory Visit (INDEPENDENT_AMBULATORY_CARE_PROVIDER_SITE_OTHER): Payer: BC Managed Care – PPO | Admitting: *Deleted

## 2013-11-22 DIAGNOSIS — Z952 Presence of prosthetic heart valve: Secondary | ICD-10-CM

## 2013-11-22 DIAGNOSIS — Z5181 Encounter for therapeutic drug level monitoring: Secondary | ICD-10-CM

## 2013-11-22 DIAGNOSIS — Z954 Presence of other heart-valve replacement: Secondary | ICD-10-CM

## 2013-11-22 DIAGNOSIS — Z7901 Long term (current) use of anticoagulants: Secondary | ICD-10-CM

## 2013-11-22 LAB — POCT INR: INR: 3.6

## 2013-12-11 ENCOUNTER — Encounter: Payer: Self-pay | Admitting: *Deleted

## 2013-12-12 ENCOUNTER — Other Ambulatory Visit: Payer: Self-pay | Admitting: *Deleted

## 2013-12-13 ENCOUNTER — Other Ambulatory Visit: Payer: Self-pay | Admitting: Pharmacist Clinician (PhC)/ Clinical Pharmacy Specialist

## 2013-12-13 MED ORDER — WARFARIN SODIUM 5 MG PO TABS: ORAL_TABLET | ORAL | Status: DC

## 2013-12-17 ENCOUNTER — Ambulatory Visit (INDEPENDENT_AMBULATORY_CARE_PROVIDER_SITE_OTHER): Payer: BC Managed Care – PPO | Admitting: *Deleted

## 2013-12-17 DIAGNOSIS — Z5181 Encounter for therapeutic drug level monitoring: Secondary | ICD-10-CM

## 2013-12-17 DIAGNOSIS — Z954 Presence of other heart-valve replacement: Secondary | ICD-10-CM

## 2013-12-17 DIAGNOSIS — Z952 Presence of prosthetic heart valve: Secondary | ICD-10-CM

## 2013-12-17 DIAGNOSIS — Z7901 Long term (current) use of anticoagulants: Secondary | ICD-10-CM

## 2013-12-17 LAB — POCT INR: INR: 3.3

## 2014-01-08 ENCOUNTER — Ambulatory Visit: Payer: BC Managed Care – PPO | Admitting: Gastroenterology

## 2014-01-14 ENCOUNTER — Ambulatory Visit (INDEPENDENT_AMBULATORY_CARE_PROVIDER_SITE_OTHER): Payer: BC Managed Care – PPO | Admitting: *Deleted

## 2014-01-14 DIAGNOSIS — Z954 Presence of other heart-valve replacement: Secondary | ICD-10-CM

## 2014-01-14 DIAGNOSIS — Z952 Presence of prosthetic heart valve: Secondary | ICD-10-CM

## 2014-01-14 DIAGNOSIS — Z7901 Long term (current) use of anticoagulants: Secondary | ICD-10-CM

## 2014-01-14 DIAGNOSIS — Z5181 Encounter for therapeutic drug level monitoring: Secondary | ICD-10-CM

## 2014-01-14 LAB — POCT INR: INR: 2.1

## 2014-01-30 ENCOUNTER — Other Ambulatory Visit: Payer: Self-pay | Admitting: Cardiovascular Disease

## 2014-01-30 NOTE — Telephone Encounter (Signed)
Rx was sent to pharmacy electronically. 

## 2014-02-13 ENCOUNTER — Ambulatory Visit (INDEPENDENT_AMBULATORY_CARE_PROVIDER_SITE_OTHER): Payer: BC Managed Care – PPO | Admitting: *Deleted

## 2014-02-13 DIAGNOSIS — Z7901 Long term (current) use of anticoagulants: Secondary | ICD-10-CM

## 2014-02-13 DIAGNOSIS — Z954 Presence of other heart-valve replacement: Secondary | ICD-10-CM

## 2014-02-13 DIAGNOSIS — Z5181 Encounter for therapeutic drug level monitoring: Secondary | ICD-10-CM

## 2014-02-13 DIAGNOSIS — Z952 Presence of prosthetic heart valve: Secondary | ICD-10-CM

## 2014-02-13 LAB — POCT INR: INR: 2.4

## 2014-02-18 ENCOUNTER — Other Ambulatory Visit: Payer: Self-pay | Admitting: Cardiovascular Disease

## 2014-02-18 NOTE — Telephone Encounter (Signed)
Rx was sent to pharmacy electronically. 

## 2014-03-13 ENCOUNTER — Ambulatory Visit (INDEPENDENT_AMBULATORY_CARE_PROVIDER_SITE_OTHER): Payer: BC Managed Care – PPO | Admitting: *Deleted

## 2014-03-13 DIAGNOSIS — Z954 Presence of other heart-valve replacement: Secondary | ICD-10-CM

## 2014-03-13 DIAGNOSIS — Z5181 Encounter for therapeutic drug level monitoring: Secondary | ICD-10-CM

## 2014-03-13 DIAGNOSIS — Z7901 Long term (current) use of anticoagulants: Secondary | ICD-10-CM

## 2014-03-13 DIAGNOSIS — Z952 Presence of prosthetic heart valve: Secondary | ICD-10-CM

## 2014-03-13 LAB — POCT INR: INR: 2.7

## 2014-04-24 ENCOUNTER — Ambulatory Visit (INDEPENDENT_AMBULATORY_CARE_PROVIDER_SITE_OTHER): Payer: BC Managed Care – PPO | Admitting: *Deleted

## 2014-04-24 DIAGNOSIS — Z954 Presence of other heart-valve replacement: Secondary | ICD-10-CM

## 2014-04-24 DIAGNOSIS — Z952 Presence of prosthetic heart valve: Secondary | ICD-10-CM

## 2014-04-24 DIAGNOSIS — Z7901 Long term (current) use of anticoagulants: Secondary | ICD-10-CM

## 2014-04-24 DIAGNOSIS — Z5181 Encounter for therapeutic drug level monitoring: Secondary | ICD-10-CM

## 2014-04-24 LAB — POCT INR: INR: 3

## 2014-05-08 ENCOUNTER — Ambulatory Visit (INDEPENDENT_AMBULATORY_CARE_PROVIDER_SITE_OTHER): Payer: BC Managed Care – PPO | Admitting: *Deleted

## 2014-05-08 DIAGNOSIS — Z7901 Long term (current) use of anticoagulants: Secondary | ICD-10-CM

## 2014-05-08 DIAGNOSIS — Z954 Presence of other heart-valve replacement: Secondary | ICD-10-CM

## 2014-05-08 DIAGNOSIS — Z952 Presence of prosthetic heart valve: Secondary | ICD-10-CM

## 2014-05-08 DIAGNOSIS — Z5181 Encounter for therapeutic drug level monitoring: Secondary | ICD-10-CM

## 2014-05-08 LAB — POCT INR: INR: 2.5

## 2014-05-14 ENCOUNTER — Other Ambulatory Visit: Payer: Self-pay

## 2014-05-14 MED ORDER — WARFARIN SODIUM 5 MG PO TABS: ORAL_TABLET | ORAL | Status: DC

## 2014-05-14 NOTE — Telephone Encounter (Signed)
Rx was sent to pharmacy electronically. 

## 2014-06-19 ENCOUNTER — Ambulatory Visit (INDEPENDENT_AMBULATORY_CARE_PROVIDER_SITE_OTHER): Payer: BC Managed Care – PPO | Admitting: *Deleted

## 2014-06-19 DIAGNOSIS — Z7901 Long term (current) use of anticoagulants: Secondary | ICD-10-CM

## 2014-06-19 DIAGNOSIS — Z952 Presence of prosthetic heart valve: Secondary | ICD-10-CM

## 2014-06-19 DIAGNOSIS — Z5181 Encounter for therapeutic drug level monitoring: Secondary | ICD-10-CM

## 2014-06-19 DIAGNOSIS — Z954 Presence of other heart-valve replacement: Secondary | ICD-10-CM

## 2014-06-19 LAB — POCT INR: INR: 2.2

## 2014-07-01 ENCOUNTER — Other Ambulatory Visit (HOSPITAL_COMMUNITY): Payer: Self-pay | Admitting: Nephrology

## 2014-07-01 DIAGNOSIS — E210 Primary hyperparathyroidism: Secondary | ICD-10-CM

## 2014-07-09 ENCOUNTER — Telehealth: Payer: Self-pay | Admitting: Cardiovascular Disease

## 2014-07-10 NOTE — Telephone Encounter (Signed)
Closed encounter °

## 2014-07-11 ENCOUNTER — Encounter (HOSPITAL_COMMUNITY)
Admission: RE | Admit: 2014-07-11 | Discharge: 2014-07-11 | Disposition: A | Discharge status: Home / Self Care | Payer: BC Managed Care – PPO | Source: Ambulatory Visit | Attending: Nephrology | Admitting: Nephrology

## 2014-07-11 ENCOUNTER — Encounter (HOSPITAL_COMMUNITY): Payer: Self-pay

## 2014-07-11 DIAGNOSIS — E210 Primary hyperparathyroidism: Secondary | ICD-10-CM | POA: Diagnosis present

## 2014-07-11 HISTORY — DX: Disorder of kidney and ureter, unspecified: N28.9

## 2014-07-11 MED ORDER — TECHNETIUM TC 99M SESTAMIBI GENERIC - CARDIOLITE
25.0000 | Freq: Once | INTRAVENOUS | Status: AC | PRN
  Administered 2014-07-11: 25 via INTRAVENOUS

## 2014-07-17 ENCOUNTER — Other Ambulatory Visit: Payer: Self-pay

## 2014-07-17 MED ORDER — FUROSEMIDE 20 MG PO TABS: 20.0000 mg | ORAL_TABLET | Freq: Every day | ORAL | Status: DC

## 2014-07-17 NOTE — Telephone Encounter (Signed)
Rx sent to pharmacy   

## 2014-07-31 ENCOUNTER — Ambulatory Visit (INDEPENDENT_AMBULATORY_CARE_PROVIDER_SITE_OTHER): Payer: Medicare Other | Admitting: *Deleted

## 2014-07-31 ENCOUNTER — Encounter (INDEPENDENT_AMBULATORY_CARE_PROVIDER_SITE_OTHER): Payer: Medicare Other

## 2014-07-31 DIAGNOSIS — Z7901 Long term (current) use of anticoagulants: Secondary | ICD-10-CM | POA: Diagnosis not present

## 2014-07-31 DIAGNOSIS — Z954 Presence of other heart-valve replacement: Secondary | ICD-10-CM | POA: Diagnosis not present

## 2014-07-31 DIAGNOSIS — Z5181 Encounter for therapeutic drug level monitoring: Secondary | ICD-10-CM

## 2014-07-31 DIAGNOSIS — Z952 Presence of prosthetic heart valve: Secondary | ICD-10-CM

## 2014-07-31 LAB — POCT INR: INR: 2.3

## 2014-08-17 ENCOUNTER — Other Ambulatory Visit: Payer: Self-pay | Admitting: Cardiovascular Disease

## 2014-08-19 NOTE — Telephone Encounter (Signed)
Rx was sent to pharmacy electronically. OV 12/4

## 2014-08-23 ENCOUNTER — Encounter: Payer: Self-pay | Admitting: Cardiovascular Disease

## 2014-08-23 ENCOUNTER — Ambulatory Visit (INDEPENDENT_AMBULATORY_CARE_PROVIDER_SITE_OTHER): Payer: Medicare Other | Admitting: Cardiovascular Disease

## 2014-08-23 VITALS — BP 144/82 | HR 77 | Resp 16 | Ht 62.0 in | Wt 262.5 lb

## 2014-08-23 DIAGNOSIS — R0602 Shortness of breath: Secondary | ICD-10-CM

## 2014-08-23 DIAGNOSIS — I10 Essential (primary) hypertension: Secondary | ICD-10-CM | POA: Diagnosis not present

## 2014-08-23 DIAGNOSIS — N183 Chronic kidney disease, stage 3 unspecified: Secondary | ICD-10-CM

## 2014-08-23 DIAGNOSIS — Z954 Presence of other heart-valve replacement: Secondary | ICD-10-CM | POA: Diagnosis not present

## 2014-08-23 DIAGNOSIS — Z952 Presence of prosthetic heart valve: Secondary | ICD-10-CM

## 2014-08-23 NOTE — Patient Instructions (Signed)
Follow up with Dr Sallyanne Kuster in 1 year.  Dr Sallyanne Kuster wants you to have an echo done.

## 2014-08-25 ENCOUNTER — Encounter: Payer: Self-pay | Admitting: Cardiovascular Disease

## 2014-08-25 NOTE — Progress Notes (Signed)
Patient ID: [name], female   DOB: July 29, 1951, 63 y.o.   MRN: [id number]     Reason for office visit Aortic valve replacement/ascending aortic repair followup  Back pain is her biggest limitation, but she believes she may be a little more short of breath than last year.  In 2011 [name] underwent a Bentall procedure for bicuspid aortic valve with ascending aortic aneurysm. She received a mechanical 21 mm St. Jude Regent aortic valve prosthesis and a 28 mm hemashield graft. In June 2014 her anticoagulants were briefly interrupted for lumbar spine surgery. The procedure occurred without complications and she has not had bleeding or clotting problems. She no longer has the severe numbness that she had in both her feet but is still requiring physical therapy to recover from the surgery. Pain has recurred and there is a discussion about more surgery. She is morbidly obese. She has mixed hyperlipidemia for she takes both a potent statin and fenofibrate. She has treated hypertension. She has had recurrent problems with iron deficiency and still takes an iron supplement.    Allergies  Allergen Reactions  . Morphine And Related Other (See Comments)    BP drops    Current Outpatient Prescriptions  Medication Sig Dispense Refill  . ALPRAZolam (XANAX) 0.5 MG tablet Take 0.5 mg by mouth at bedtime as needed for sleep.     Marland Kitchen aspirin EC 81 MG tablet Take 81 mg by mouth at bedtime.    Marland Kitchen b complex vitamins tablet Take 1 tablet by mouth daily.    . bisacodyl (DULCOLAX) 5 MG EC tablet Take 5 mg by mouth daily as needed for moderate constipation.    . cetirizine (ZYRTEC) 10 MG tablet Take 10 mg by mouth daily.    . CRESTOR 20 MG tablet TAKE ONE TABLET BY MOUTH ONCE DAILY 30 tablet 6  . docusate sodium 100 MG CAPS Take 100 mg by mouth 2 (two) times daily. 60 capsule 0  . fenofibrate (TRICOR) 145 MG tablet TAKE ONE TABLET BY MOUTH AT BEDTIME 30 tablet 0  . ferrous sulfate 325 (65 FE) MG  tablet Take 325 mg by mouth 2 (two) times daily.    . furosemide (LASIX) 20 MG tablet Take 1 tablet (20 mg total) by mouth daily. 30 tablet 1  . glimepiride (AMARYL) 2 MG tablet Take 2 mg by mouth daily before breakfast.    . HYDROcodone-acetaminophen (NORCO/VICODIN) 5-325 MG per tablet Take 1 tablet by mouth every 6 (six) hours as needed for moderate pain.    Marland Kitchen lisinopril (PRINIVIL,ZESTRIL) 10 MG tablet Take 10 mg by mouth daily.    . metoprolol (LOPRESSOR) 50 MG tablet TAKE ONE-HALF TABLET BY MOUTH TWICE DAILY 30 tablet 10  . Multiple Vitamin (MULTIVITAMIN WITH MINERALS) TABS Take 1 tablet by mouth daily.    . multivitamin-lutein (OCUVITE-LUTEIN) CAPS Take 1 capsule by mouth daily.    . Omega-3 Fatty Acids (FISH OIL) 1000 MG CAPS Take 1,000 mg by mouth daily.    Marland Kitchen warfarin (COUMADIN) 5 MG tablet Take 1 tablet by mouth daily or as directed 30 tablet 5  . zolpidem (AMBIEN) 10 MG tablet Take 10 mg by mouth at bedtime.      No current facility-administered medications for this visit.    Past Medical History  Diagnosis Date  . Diabetes mellitus   . Hyperlipidemia   . AS (aortic stenosis) 09/18/2012    2D Echo EF 55%-60%  . AI (aortic insufficiency)   . Tricuspid regurgitation   .  PONV (postoperative nausea and vomiting)   . Cough   . Chronic kidney disease     renal insuffcentily  . Arthritis   . Obese   . Hypertension     dr Donetta Potts  . Renal insufficiency     Past Surgical History  Procedure Laterality Date  . Aortic valve replacement  04/01/2010    21-mm Sr Jude Regent ;mechanical valve   . Replacement  of ascending aortic aneurysm  04/01/2010    28-mm supracoronary Hemashield tube graft using deep hypothermic circulatory arrest  . Cardiac catheterization  02/09/2010  . Broken arm      wire in lt elbow  . Lumbar laminectomy/decompression microdiscectomy N/A 02/28/2013    Procedure: LUMBAR LAMINECTOMY/DECOMPRESSION MICRODISCECTOMY 2 LEVELS;  Surgeon: Ophelia Charter, MD;   Location: [facility];  Service: Neurosurgery;  Laterality: N/A;  Lumbar three-four and Lumbar four-five laminectomies    Family History  Problem Relation Age of Onset  . CAD    . Cancer - Prostate Father   . Cancer - Lung Maternal Grandfather   . Diabetes    . Hypertension      History   Social History  . Marital Status: Married    Spouse Name: N/A    Number of Children: N/A  . Years of Education: N/A   Occupational History  . Not on file.   Social History Main Topics  . Smoking status: Never Smoker   . Smokeless tobacco: Never Used  . Alcohol Use: No  . Drug Use: Not on file  . Sexual Activity: Not on file   Other Topics Concern  . Not on file   Social History Narrative    Review of systems: Back pain dominates her ROS. The patient specifically denies any chest pain at rest or with exertion, dyspnea at rest or with exertion, orthopnea, paroxysmal nocturnal dyspnea, syncope, palpitations, focal neurological deficits, intermittent claudication, lower extremity edema, unexplained weight gain, cough, hemoptysis or wheezing.  The patient also denies abdominal pain, nausea, vomiting, dysphagia, diarrhea, constipation, polyuria, polydipsia, dysuria, hematuria, frequency, urgency, abnormal bleeding or bruising, fever, chills, unexpected weight changes, mood swings, change in skin or hair texture, change in voice quality, auditory or visual problems, allergic reactions or rashes, new musculoskeletal complaints other than usual "aches and pains", especially in her lower back.  PHYSICAL EXAM BP 144/82 mmHg  Pulse 77  Resp 16  Ht 5\' 2"  (1.575 m)  Wt 262 lb 8 oz (119.069 kg)  BMI 48.00 kg/m2 General: Alert, oriented x3, no distress Head: no evidence of trauma, PERRL, EOMI, no exophtalmos or lid lag, no myxedema, no xanthelasma; normal ears, nose and oropharynx Neck: normal jugular venous pulsations and no hepatojugular reflux; brisk carotid pulses without delay and no carotid  bruits Chest: clear to auscultation, no signs of consolidation by percussion or palpation, normal fremitus, symmetrical and full respiratory excursions Cardiovascular: normal position and quality of the apical impulse, regular rhythm, normal first and second heart sounds, very crisp prostatic valve clicks to radiate all the upper carotids to the angle of her jaw, grade 1/6 early peaking systolic ejection murmur, no diastolic murmurs or rubs or gallops Abdomen: no tenderness or distention, no masses by palpation, no abnormal pulsatility or arterial bruits, normal bowel sounds, no hepatosplenomegaly Extremities: no clubbing, cyanosis or edema; 2+ radial, ulnar and brachial pulses bilaterally; 2+ right femoral, posterior tibial and dorsalis pedis pulses; 2+ left femoral, posterior tibial and dorsalis pedis pulses; no subclavian or femoral bruits Neurological: grossly  nonfocal   EKG: Normal sinus rhythm, minimal voltage criteria for LVH (likely masked by obesity) with secondary repolarization changes especially obvious in leads 1 and aVL, most likely secondary to LVH. EKG is not changed from previous tracings. Note that in the immediate postoperative care she had a complete left bundle branch block.   Lipid Panel     Component Value Date/Time   CHOL 168 08/06/2013 1008   TRIG 229* 08/06/2013 1008   HDL 54 08/06/2013 1008   CHOLHDL 3.1 08/06/2013 1008   VLDL 46* 08/06/2013 1008   LDLCALC 68 08/06/2013 1008    BMET    Component Value Date/Time   NA 137 02/22/2013 0833   K 4.1 02/22/2013 0833   CL 100 02/22/2013 0833   CO2 28 02/22/2013 0833   GLUCOSE 102* 02/22/2013 0833   BUN 23 02/22/2013 0833   CREATININE 1.35* 02/22/2013 0833   CALCIUM 10.7* 02/22/2013 0833   GFRNONAA 41* 02/22/2013 0833   GFRAA 47* 02/22/2013 0833     ASSESSMENT AND PLAN Hx of mechanical aortic valve replacement She is now roughly 3 years status post her valve replacement and ascending aortic graft repair. She  has no symptoms of congestive heart failure despite the relatively small size of the prosthesis, but she is very sedentary.   Hypertension Adequate control - BP usually lower than today.  Hyperlipidemia Time to repeat her lipid profile and liver function tests.  Chronic kidney disease (CKD), stage III (moderate) Creatinine clearance is around 40 mL per minute and stable. Orders Placed This Encounter  Procedures  . EKG 12-Lead  . 2D Echocardiogram without contrast   No orders of the defined types were placed in this encounter.    Holli Humbles, MD, [place] [phone] office [phone] pager

## 2014-08-27 ENCOUNTER — Ambulatory Visit (HOSPITAL_COMMUNITY)
Admission: RE | Admit: 2014-08-27 | Discharge: 2014-08-27 | Disposition: A | Discharge status: Home / Self Care | Payer: Medicare Other | Source: Ambulatory Visit | Attending: Internal Medicine | Admitting: Internal Medicine

## 2014-08-27 DIAGNOSIS — Z952 Presence of prosthetic heart valve: Secondary | ICD-10-CM | POA: Insufficient documentation

## 2014-08-27 DIAGNOSIS — R0602 Shortness of breath: Secondary | ICD-10-CM | POA: Insufficient documentation

## 2014-08-27 NOTE — Progress Notes (Signed)
2D Echocardiogram Complete.  08/27/2014   Vinod Mikesell, RDCS  

## 2014-09-03 ENCOUNTER — Other Ambulatory Visit: Payer: Self-pay | Admitting: Cardiovascular Disease

## 2014-09-11 ENCOUNTER — Ambulatory Visit (INDEPENDENT_AMBULATORY_CARE_PROVIDER_SITE_OTHER): Payer: Medicare Other | Admitting: *Deleted

## 2014-09-11 DIAGNOSIS — Z5181 Encounter for therapeutic drug level monitoring: Secondary | ICD-10-CM | POA: Diagnosis not present

## 2014-09-11 DIAGNOSIS — Z7901 Long term (current) use of anticoagulants: Secondary | ICD-10-CM | POA: Diagnosis not present

## 2014-09-11 DIAGNOSIS — Z952 Presence of prosthetic heart valve: Secondary | ICD-10-CM

## 2014-09-11 DIAGNOSIS — Z954 Presence of other heart-valve replacement: Secondary | ICD-10-CM | POA: Diagnosis not present

## 2014-09-11 LAB — POCT INR: INR: 2.1

## 2014-09-17 ENCOUNTER — Other Ambulatory Visit: Payer: Self-pay | Admitting: Cardiovascular Disease

## 2014-09-17 ENCOUNTER — Emergency Department (HOSPITAL_COMMUNITY)
Admission: EM | Admit: 2014-09-17 | Discharge: 2014-09-17 | Disposition: A | Discharge status: Home / Self Care | Payer: Medicare Other | Attending: Emergency Medicine | Admitting: Emergency Medicine

## 2014-09-17 ENCOUNTER — Encounter (HOSPITAL_COMMUNITY): Payer: Self-pay | Admitting: *Deleted

## 2014-09-17 DIAGNOSIS — Y9389 Activity, other specified: Secondary | ICD-10-CM | POA: Diagnosis not present

## 2014-09-17 DIAGNOSIS — W260XXA Contact with knife, initial encounter: Secondary | ICD-10-CM | POA: Insufficient documentation

## 2014-09-17 DIAGNOSIS — E119 Type 2 diabetes mellitus without complications: Secondary | ICD-10-CM | POA: Insufficient documentation

## 2014-09-17 DIAGNOSIS — Z9089 Acquired absence of other organs: Secondary | ICD-10-CM | POA: Insufficient documentation

## 2014-09-17 DIAGNOSIS — Y998 Other external cause status: Secondary | ICD-10-CM | POA: Insufficient documentation

## 2014-09-17 DIAGNOSIS — I2510 Atherosclerotic heart disease of native coronary artery without angina pectoris: Secondary | ICD-10-CM | POA: Diagnosis not present

## 2014-09-17 DIAGNOSIS — N189 Chronic kidney disease, unspecified: Secondary | ICD-10-CM | POA: Insufficient documentation

## 2014-09-17 DIAGNOSIS — R079 Chest pain, unspecified: Secondary | ICD-10-CM | POA: Insufficient documentation

## 2014-09-17 DIAGNOSIS — I129 Hypertensive chronic kidney disease with stage 1 through stage 4 chronic kidney disease, or unspecified chronic kidney disease: Secondary | ICD-10-CM | POA: Diagnosis not present

## 2014-09-17 DIAGNOSIS — Z79899 Other long term (current) drug therapy: Secondary | ICD-10-CM | POA: Diagnosis not present

## 2014-09-17 DIAGNOSIS — S61219A Laceration without foreign body of unspecified finger without damage to nail, initial encounter: Secondary | ICD-10-CM

## 2014-09-17 DIAGNOSIS — Z7982 Long term (current) use of aspirin: Secondary | ICD-10-CM | POA: Insufficient documentation

## 2014-09-17 DIAGNOSIS — S61211A Laceration without foreign body of left index finger without damage to nail, initial encounter: Secondary | ICD-10-CM | POA: Diagnosis not present

## 2014-09-17 DIAGNOSIS — E669 Obesity, unspecified: Secondary | ICD-10-CM | POA: Diagnosis not present

## 2014-09-17 DIAGNOSIS — Y929 Unspecified place or not applicable: Secondary | ICD-10-CM | POA: Insufficient documentation

## 2014-09-17 HISTORY — DX: Atherosclerotic heart disease of native coronary artery without angina pectoris: I25.10

## 2014-09-17 MED ORDER — TETANUS-DIPHTH-ACELL PERTUSSIS 5-2.5-18.5 LF-MCG/0.5 IM SUSP
0.5000 mL | Freq: Once | INTRAMUSCULAR | Status: AC
  Administered 2014-09-17: 0.5 mL via INTRAMUSCULAR
  Filled 2014-09-17: qty 0.5

## 2014-09-17 MED ORDER — LIDOCAINE HCL (PF) 1 % IJ SOLN
5.0000 mL | Freq: Once | INTRAMUSCULAR | Status: AC
  Administered 2014-09-17: 5 mL
  Filled 2014-09-17: qty 5

## 2014-09-17 MED ORDER — POVIDONE-IODINE 10 % EX SOLN
CUTANEOUS | Status: AC
  Administered 2014-09-17: 23:00:00
  Filled 2014-09-17: qty 118

## 2014-09-17 NOTE — Telephone Encounter (Signed)
Rx(s) sent to pharmacy electronically.  

## 2014-09-17 NOTE — ED Provider Notes (Signed)
CSN: [id number]     Arrival date & time 09/17/14  1750 History   First MD Initiated Contact with Patient 09/17/14 2133     Chief Complaint  Patient presents with  . Laceration     (Consider location/radiation/quality/duration/timing/severity/associated sxs/prior Treatment) HPI Comments: Patient is a 63 year old female who presents to the emergency department with complaint of laceration to the left index finger. The patient states she was cutting some ham leftovers, with the knife slipped and cut her left index finger. It is of note that she is on Coumadin, and had some extra bleeding. She went to her doctor's office, but nearly passed out during the process of "numbing the site". At this point the patient's primary physician felt that she should come to the emergency department for evaluation and for suturing of the wound. Patient is unsure of the date of her last tetanus. Patient has not taken any medication for this problem, she has applied pressure.  Patient is a 63 y.o. female presenting with skin laceration. The history is provided by the patient.  Laceration Location:  Hand Hand laceration location:  L finger   Past Medical History  Diagnosis Date  . Diabetes mellitus   . Hyperlipidemia   . AS (aortic stenosis) 09/18/2012    2D Echo EF 55%-60%  . AI (aortic insufficiency)   . Tricuspid regurgitation   . PONV (postoperative nausea and vomiting)   . Cough   . Arthritis   . Obese   . Hypertension     dr Donetta Potts  . Chronic kidney disease     renal insuffcentily  . Renal insufficiency   . Coronary artery disease    Past Surgical History  Procedure Laterality Date  . Aortic valve replacement  04/01/2010    21-mm Sr Jude Regent ;mechanical valve   . Replacement  of ascending aortic aneurysm  04/01/2010    28-mm supracoronary Hemashield tube graft using deep hypothermic circulatory arrest  . Cardiac catheterization  02/09/2010  . Broken arm      wire in lt elbow  .  Lumbar laminectomy/decompression microdiscectomy N/A 02/28/2013    Procedure: LUMBAR LAMINECTOMY/DECOMPRESSION MICRODISCECTOMY 2 LEVELS;  Surgeon: Ophelia Charter, MD;  Location: [facility];  Service: Neurosurgery;  Laterality: N/A;  Lumbar three-four and Lumbar four-five laminectomies  . Back surgery    . Fracture surgery     Family History  Problem Relation Age of Onset  . CAD    . Cancer - Prostate Father   . Cancer - Lung Maternal Grandfather   . Diabetes    . Hypertension     History  Substance Use Topics  . Smoking status: Never Smoker   . Smokeless tobacco: Never Used  . Alcohol Use: No   OB History    No data available     Review of Systems  Constitutional: Negative for activity change.       All ROS Neg except as noted in HPI  HENT: Negative for nosebleeds.   Eyes: Negative for photophobia and discharge.  Respiratory: Positive for cough. Negative for shortness of breath and wheezing.   Cardiovascular: Positive for chest pain. Negative for palpitations.  Gastrointestinal: Negative for abdominal pain and blood in stool.  Genitourinary: Negative for dysuria, frequency and hematuria.  Musculoskeletal: Positive for arthralgias. Negative for back pain and neck pain.  Skin: Negative.   Neurological: Negative for dizziness, seizures and speech difficulty.  Psychiatric/Behavioral: Negative for hallucinations and confusion.      Allergies  Morphine and related  Home Medications   Prior to Admission medications   Medication Sig Start Date End Date Taking? Authorizing Provider  ALPRAZolam Duanne Moron) 0.5 MG tablet Take 0.5 mg by mouth at bedtime as needed for sleep.    Yes Historical Provider, MD  aspirin EC 81 MG tablet Take 81 mg by mouth at bedtime.   Yes Historical Provider, MD  b complex vitamins tablet Take 1 tablet by mouth daily.   Yes Historical Provider, MD  bisacodyl (DULCOLAX) 5 MG EC tablet Take 5 mg by mouth daily as needed for moderate constipation.   Yes  Historical Provider, MD  CRESTOR 20 MG tablet TAKE ONE TABLET BY MOUTH ONCE DAILY 01/30/14  Yes Mihai Croitoru, MD  docusate sodium 100 MG CAPS Take 100 mg by mouth 2 (two) times daily. 03/02/13  Yes Newman Pies, MD  fenofibrate (TRICOR) 145 MG tablet TAKE ONE TABLET BY MOUTH ONCE DAILY AT BEDTIME 09/17/14  Yes Mihai Croitoru, MD  ferrous sulfate 325 (65 FE) MG tablet Take 325 mg by mouth 2 (two) times daily.   Yes Historical Provider, MD  furosemide (LASIX) 20 MG tablet Take 1 tablet (20 mg total) by mouth daily. 07/17/14  Yes Mihai Croitoru, MD  glimepiride (AMARYL) 2 MG tablet Take 2 mg by mouth daily before breakfast.   Yes Historical Provider, MD  HYDROcodone-acetaminophen (NORCO/VICODIN) 5-325 MG per tablet Take 1 tablet by mouth every 6 (six) hours as needed for moderate pain.   Yes Historical Provider, MD  lisinopril (PRINIVIL,ZESTRIL) 10 MG tablet Take 10 mg by mouth daily.   Yes Historical Provider, MD  metoprolol (LOPRESSOR) 50 MG tablet TAKE ONE-HALF TABLET BY MOUTH TWICE DAILY 09/03/14  Yes Mihai Croitoru, MD  Multiple Vitamin (MULTIVITAMIN WITH MINERALS) TABS Take 1 tablet by mouth daily.   Yes Historical Provider, MD  multivitamin-lutein (OCUVITE-LUTEIN) CAPS Take 1 capsule by mouth daily.   Yes Historical Provider, MD  Omega-3 Fatty Acids (FISH OIL) 1000 MG CAPS Take 1,000 mg by mouth daily.   Yes Historical Provider, MD  warfarin (COUMADIN) 5 MG tablet Take 1 tablet by mouth daily or as directed Patient taking differently: Take 2.5-5 mg by mouth See admin instructions. Takes 2.5mg  on MWF, then takes 5mg  on all other days 05/14/14  Yes Mihai Croitoru, MD  zolpidem (AMBIEN) 10 MG tablet Take 10 mg by mouth at bedtime.    Yes Historical Provider, MD   BP 129/57 mmHg  Pulse 75  Temp(Src) 98.5 F (36.9 C) (Oral)  Resp 20  Ht 5\' 2"  (1.575 m)  Wt 260 lb (117.935 kg)  BMI 47.54 kg/m2  SpO2 100% Physical Exam  Constitutional: She is oriented to person, place, and time. She appears  well-developed and well-nourished.  Non-toxic appearance.  HENT:  Head: Normocephalic.  Right Ear: Tympanic membrane and external ear normal.  Left Ear: Tympanic membrane and external ear normal.  Eyes: EOM and lids are normal. Pupils are equal, round, and reactive to light.  Neck: Normal range of motion. Neck supple. Carotid bruit is not present.  Cardiovascular: Normal rate, regular rhythm, normal heart sounds, intact distal pulses and normal pulses.   Pulmonary/Chest: Breath sounds normal. No respiratory distress.  Abdominal: Soft. Bowel sounds are normal. There is no tenderness. There is no guarding.  Musculoskeletal: Normal range of motion.  There is a 2 cm laceration at the DIP joint of the left finger. There is no bone or tendon involvement. Mild bleeding appreciated at this time. Capillary refill is  less than 2 seconds.  Lymphadenopathy:       Head (right side): No submandibular adenopathy present.       Head (left side): No submandibular adenopathy present.    She has no cervical adenopathy.  Neurological: She is alert and oriented to person, place, and time. She has normal strength. No cranial nerve deficit or sensory deficit.  No motor or sensory deficit appreciated of the left upper extremity.  Skin: Skin is warm and dry.  Psychiatric: She has a normal mood and affect. Her speech is normal.  Nursing note and vitals reviewed.   ED Course  LACERATION REPAIR Date/Time: 09/17/2014 10:35 PM Performed by: Lenox Ahr Authorized by: Lenox Ahr Consent: Verbal consent obtained. Risks and benefits: risks, benefits and alternatives were discussed Consent given by: patient Patient understanding: patient states understanding of the procedure being performed Patient identity confirmed: arm band Time out: Immediately prior to procedure a "time out" was called to verify the correct patient, procedure, equipment, support staff and site/side marked as required. Body area:  upper extremity Location details: left index finger Laceration length: 2 cm Foreign bodies: no foreign bodies Tendon involvement: none Nerve involvement: none Vascular damage: no Anesthesia: digital block Local anesthetic: lidocaine 1% without epinephrine Patient sedated: no Preparation: Patient was prepped and draped in the usual sterile fashion. Irrigation solution: saline Amount of cleaning: standard Skin closure: 4-0 nylon Number of sutures: 6 Technique: simple Approximation: close Approximation difficulty: simple Dressing: gauze roll Patient tolerance: Patient tolerated the procedure well with no immediate complications   (including critical care time) Labs Review Labs Reviewed - No data to display  Imaging Review No results found.   EKG Interpretation None      MDM  Laceration to the left index finger repaired with 4-0 nylon. Pt given instructions to return if any infection.  No motor/sensory or vascular deficit noted.  Pt to have sutures removed in 7 days.  She will return sooner if any bleeding or signs of infection.   Final diagnoses:  Laceration of finger, initial encounter    **I have reviewed nursing notes, vital signs, and all appropriate lab and imaging results for this patient.  Lenox Ahr, PA-C 09/20/14 2057  Maudry Diego, MD 09/23/14 2203

## 2014-09-17 NOTE — ED Notes (Addendum)
Laceration to lt index finger with kitchen knife,  Seen at Dr 's office and began to feel faint , and sent to ED. Pt takes coumadin

## 2014-09-17 NOTE — Discharge Instructions (Signed)
Please keep the wound clean and dry. Please have your sutures removed in 7 days. Please see your doctor or return to the emergency department if any signs of advancing infection. Sutured Wound Care Sutures are stitches that can be used to close wounds. Caring for your wound can help stop infection and lessen pain. HOME CARE   Rest and raise (elevate) the injured area until the pain and puffiness (swelling) go away.  Only take medicines as told by your doctor.  Clean the wound gently with mild soap and water once a day after the first 2 days. Rinse off the soap. Pat the area dry with a clean towel. Do not rub the wound.  Change the bandage (dressing) as told by your doctor. If the bandage sticks, soak it off with soapy water. Stop using a bandage after 2 days or after the wound stops leaking fluid.  Put cream on the wound as told by your doctor.  Do not stretch the wound.  Drink enough fluids to keep your pee (urine) clear or pale yellow.  See your doctor to have the sutures removed.  Use sunscreen or sunblock on the wound after it heals. GET HELP RIGHT AWAY IF:   Your wound gets red, puffy, hot, or tender.  You have more pain in the wound.  You have a red streak that goes away from the wound.  You see yellowish-white fluid (pus) coming out of the wound.  You have a fever.  You have chills and start to shake.  You notice a bad smell coming from the wound.  Your wound will not stop bleeding. MAKE SURE YOU:   Understand these instructions.  Will watch your condition.  Will get help right away if you are not doing well or get worse. Document Released: 02/23/2008 Document Revised: 11/29/2011 Document Reviewed: 01/10/2011 [facility] Patient Information 2015 [place], [place]. This information is not intended to replace advice given to you by your health care provider. Make sure you discuss any questions you have with your health care provider.

## 2014-09-17 NOTE — ED Notes (Signed)
Pt states she still feels faint

## 2014-09-18 ENCOUNTER — Telehealth: Payer: Self-pay | Admitting: Cardiovascular Disease

## 2014-09-18 NOTE — Telephone Encounter (Signed)
[name] at [facility] in [place] has a question about [name] medication (Warfrain 5mg ) . Need to know if it is ok to switch to generic manufacture to another. Please call   Thanks

## 2014-09-18 NOTE — Telephone Encounter (Signed)
Spoke with pharmacy, gave verbal authorization for manufacturer switch for warfarin under Kristin's advisement.

## 2014-09-24 ENCOUNTER — Other Ambulatory Visit: Payer: Self-pay | Admitting: Cardiovascular Disease

## 2014-09-25 NOTE — Telephone Encounter (Signed)
Rx(s) sent to pharmacy electronically.  

## 2014-10-15 DIAGNOSIS — M4317 Spondylolisthesis, lumbosacral region: Secondary | ICD-10-CM | POA: Diagnosis not present

## 2014-10-15 DIAGNOSIS — M545 Low back pain: Secondary | ICD-10-CM | POA: Diagnosis not present

## 2014-10-15 DIAGNOSIS — Z6841 Body Mass Index (BMI) 40.0 and over, adult: Secondary | ICD-10-CM | POA: Diagnosis not present

## 2014-10-23 ENCOUNTER — Ambulatory Visit (INDEPENDENT_AMBULATORY_CARE_PROVIDER_SITE_OTHER): Payer: Medicare Other | Admitting: *Deleted

## 2014-10-23 DIAGNOSIS — Z7901 Long term (current) use of anticoagulants: Secondary | ICD-10-CM

## 2014-10-23 DIAGNOSIS — Z5181 Encounter for therapeutic drug level monitoring: Secondary | ICD-10-CM

## 2014-10-23 DIAGNOSIS — Z954 Presence of other heart-valve replacement: Secondary | ICD-10-CM | POA: Diagnosis not present

## 2014-10-23 DIAGNOSIS — Z952 Presence of prosthetic heart valve: Secondary | ICD-10-CM

## 2014-10-23 LAB — POCT INR: INR: 2

## 2014-11-05 DIAGNOSIS — R809 Proteinuria, unspecified: Secondary | ICD-10-CM | POA: Diagnosis not present

## 2014-11-05 DIAGNOSIS — I10 Essential (primary) hypertension: Secondary | ICD-10-CM | POA: Diagnosis not present

## 2014-11-05 DIAGNOSIS — N183 Chronic kidney disease, stage 3 (moderate): Secondary | ICD-10-CM | POA: Diagnosis not present

## 2014-11-05 DIAGNOSIS — E559 Vitamin D deficiency, unspecified: Secondary | ICD-10-CM | POA: Diagnosis not present

## 2014-11-05 DIAGNOSIS — Z79899 Other long term (current) drug therapy: Secondary | ICD-10-CM | POA: Diagnosis not present

## 2014-11-05 DIAGNOSIS — D509 Iron deficiency anemia, unspecified: Secondary | ICD-10-CM | POA: Diagnosis not present

## 2014-11-06 DIAGNOSIS — M4806 Spinal stenosis, lumbar region: Secondary | ICD-10-CM | POA: Diagnosis not present

## 2014-11-06 DIAGNOSIS — M4316 Spondylolisthesis, lumbar region: Secondary | ICD-10-CM | POA: Diagnosis not present

## 2014-11-06 DIAGNOSIS — M47897 Other spondylosis, lumbosacral region: Secondary | ICD-10-CM | POA: Diagnosis not present

## 2014-11-06 DIAGNOSIS — M47816 Spondylosis without myelopathy or radiculopathy, lumbar region: Secondary | ICD-10-CM | POA: Diagnosis not present

## 2014-11-06 DIAGNOSIS — Z9889 Other specified postprocedural states: Secondary | ICD-10-CM | POA: Diagnosis not present

## 2014-11-25 ENCOUNTER — Other Ambulatory Visit: Payer: Self-pay | Admitting: Cardiovascular Disease

## 2014-11-25 NOTE — Telephone Encounter (Signed)
Rx(s) sent to pharmacy electronically.  

## 2014-12-04 ENCOUNTER — Ambulatory Visit (INDEPENDENT_AMBULATORY_CARE_PROVIDER_SITE_OTHER): Payer: Medicare Other | Admitting: *Deleted

## 2014-12-04 DIAGNOSIS — Z5181 Encounter for therapeutic drug level monitoring: Secondary | ICD-10-CM | POA: Diagnosis not present

## 2014-12-04 DIAGNOSIS — Z7901 Long term (current) use of anticoagulants: Secondary | ICD-10-CM

## 2014-12-04 DIAGNOSIS — Z952 Presence of prosthetic heart valve: Secondary | ICD-10-CM

## 2014-12-04 DIAGNOSIS — Z954 Presence of other heart-valve replacement: Secondary | ICD-10-CM | POA: Diagnosis not present

## 2014-12-04 LAB — POCT INR: INR: 1.8

## 2014-12-23 DIAGNOSIS — Z0279 Encounter for issue of other medical certificate: Secondary | ICD-10-CM

## 2014-12-25 ENCOUNTER — Ambulatory Visit (INDEPENDENT_AMBULATORY_CARE_PROVIDER_SITE_OTHER): Payer: Medicare Other | Admitting: *Deleted

## 2014-12-25 DIAGNOSIS — Z7901 Long term (current) use of anticoagulants: Secondary | ICD-10-CM | POA: Diagnosis not present

## 2014-12-25 DIAGNOSIS — Z952 Presence of prosthetic heart valve: Secondary | ICD-10-CM

## 2014-12-25 DIAGNOSIS — Z954 Presence of other heart-valve replacement: Secondary | ICD-10-CM | POA: Diagnosis not present

## 2014-12-25 DIAGNOSIS — Z5181 Encounter for therapeutic drug level monitoring: Secondary | ICD-10-CM | POA: Diagnosis not present

## 2014-12-25 LAB — POCT INR: INR: 2.5

## 2014-12-30 DIAGNOSIS — Z6841 Body Mass Index (BMI) 40.0 and over, adult: Secondary | ICD-10-CM | POA: Diagnosis not present

## 2014-12-30 DIAGNOSIS — J069 Acute upper respiratory infection, unspecified: Secondary | ICD-10-CM | POA: Diagnosis not present

## 2014-12-30 DIAGNOSIS — J301 Allergic rhinitis due to pollen: Secondary | ICD-10-CM | POA: Diagnosis not present

## 2015-01-09 ENCOUNTER — Other Ambulatory Visit: Payer: Self-pay | Admitting: Cardiovascular Disease

## 2015-01-24 DIAGNOSIS — B370 Candidal stomatitis: Secondary | ICD-10-CM | POA: Diagnosis not present

## 2015-01-24 DIAGNOSIS — J302 Other seasonal allergic rhinitis: Secondary | ICD-10-CM | POA: Diagnosis not present

## 2015-01-24 DIAGNOSIS — Z6841 Body Mass Index (BMI) 40.0 and over, adult: Secondary | ICD-10-CM | POA: Diagnosis not present

## 2015-01-27 ENCOUNTER — Ambulatory Visit (INDEPENDENT_AMBULATORY_CARE_PROVIDER_SITE_OTHER): Payer: Medicare Other | Admitting: *Deleted

## 2015-01-27 DIAGNOSIS — Z7901 Long term (current) use of anticoagulants: Secondary | ICD-10-CM | POA: Diagnosis not present

## 2015-01-27 DIAGNOSIS — Z954 Presence of other heart-valve replacement: Secondary | ICD-10-CM | POA: Diagnosis not present

## 2015-01-27 DIAGNOSIS — Z5181 Encounter for therapeutic drug level monitoring: Secondary | ICD-10-CM

## 2015-01-27 DIAGNOSIS — Z952 Presence of prosthetic heart valve: Secondary | ICD-10-CM

## 2015-01-27 LAB — POCT INR: INR: 2.1

## 2015-03-12 ENCOUNTER — Ambulatory Visit (INDEPENDENT_AMBULATORY_CARE_PROVIDER_SITE_OTHER): Payer: Medicare Other | Admitting: *Deleted

## 2015-03-12 DIAGNOSIS — Z7901 Long term (current) use of anticoagulants: Secondary | ICD-10-CM | POA: Diagnosis not present

## 2015-03-12 DIAGNOSIS — Z954 Presence of other heart-valve replacement: Secondary | ICD-10-CM | POA: Diagnosis not present

## 2015-03-12 DIAGNOSIS — Z5181 Encounter for therapeutic drug level monitoring: Secondary | ICD-10-CM | POA: Diagnosis not present

## 2015-03-12 DIAGNOSIS — Z952 Presence of prosthetic heart valve: Secondary | ICD-10-CM

## 2015-03-12 LAB — POCT INR: INR: 3.2

## 2015-03-17 DIAGNOSIS — D509 Iron deficiency anemia, unspecified: Secondary | ICD-10-CM | POA: Diagnosis not present

## 2015-03-17 DIAGNOSIS — Z79899 Other long term (current) drug therapy: Secondary | ICD-10-CM | POA: Diagnosis not present

## 2015-03-17 DIAGNOSIS — E559 Vitamin D deficiency, unspecified: Secondary | ICD-10-CM | POA: Diagnosis not present

## 2015-03-17 DIAGNOSIS — R809 Proteinuria, unspecified: Secondary | ICD-10-CM | POA: Diagnosis not present

## 2015-03-17 DIAGNOSIS — N183 Chronic kidney disease, stage 3 (moderate): Secondary | ICD-10-CM | POA: Diagnosis not present

## 2015-03-17 DIAGNOSIS — I10 Essential (primary) hypertension: Secondary | ICD-10-CM | POA: Diagnosis not present

## 2015-03-19 DIAGNOSIS — N183 Chronic kidney disease, stage 3 (moderate): Secondary | ICD-10-CM | POA: Diagnosis not present

## 2015-03-19 DIAGNOSIS — E559 Vitamin D deficiency, unspecified: Secondary | ICD-10-CM | POA: Diagnosis not present

## 2015-03-19 DIAGNOSIS — I10 Essential (primary) hypertension: Secondary | ICD-10-CM | POA: Diagnosis not present

## 2015-03-19 DIAGNOSIS — I509 Heart failure, unspecified: Secondary | ICD-10-CM | POA: Diagnosis not present

## 2015-03-27 ENCOUNTER — Other Ambulatory Visit (HOSPITAL_COMMUNITY): Payer: Self-pay | Admitting: Physician Assistant

## 2015-03-27 ENCOUNTER — Ambulatory Visit (HOSPITAL_COMMUNITY)
Admission: RE | Admit: 2015-03-27 | Discharge: 2015-03-27 | Disposition: A | Discharge status: Home / Self Care | Payer: Medicare Other | Source: Ambulatory Visit | Attending: Physician Assistant | Admitting: Physician Assistant

## 2015-03-27 DIAGNOSIS — M25572 Pain in left ankle and joints of left foot: Secondary | ICD-10-CM | POA: Diagnosis not present

## 2015-03-27 DIAGNOSIS — M7989 Other specified soft tissue disorders: Secondary | ICD-10-CM | POA: Diagnosis not present

## 2015-03-27 DIAGNOSIS — M79672 Pain in left foot: Secondary | ICD-10-CM | POA: Diagnosis not present

## 2015-03-27 DIAGNOSIS — M1009 Idiopathic gout, multiple sites: Secondary | ICD-10-CM | POA: Diagnosis not present

## 2015-03-27 DIAGNOSIS — Z6841 Body Mass Index (BMI) 40.0 and over, adult: Secondary | ICD-10-CM | POA: Diagnosis not present

## 2015-03-27 DIAGNOSIS — M19072 Primary osteoarthritis, left ankle and foot: Secondary | ICD-10-CM | POA: Diagnosis not present

## 2015-03-27 DIAGNOSIS — R2242 Localized swelling, mass and lump, left lower limb: Secondary | ICD-10-CM | POA: Insufficient documentation

## 2015-04-01 ENCOUNTER — Other Ambulatory Visit (HOSPITAL_COMMUNITY): Payer: Self-pay | Admitting: Family Medicine

## 2015-04-01 DIAGNOSIS — N183 Chronic kidney disease, stage 3 (moderate): Secondary | ICD-10-CM | POA: Diagnosis not present

## 2015-04-01 DIAGNOSIS — Z1231 Encounter for screening mammogram for malignant neoplasm of breast: Secondary | ICD-10-CM

## 2015-04-01 DIAGNOSIS — I10 Essential (primary) hypertension: Secondary | ICD-10-CM | POA: Diagnosis not present

## 2015-04-01 DIAGNOSIS — E1129 Type 2 diabetes mellitus with other diabetic kidney complication: Secondary | ICD-10-CM | POA: Diagnosis not present

## 2015-04-01 DIAGNOSIS — Z6841 Body Mass Index (BMI) 40.0 and over, adult: Secondary | ICD-10-CM | POA: Diagnosis not present

## 2015-04-01 DIAGNOSIS — Z23 Encounter for immunization: Secondary | ICD-10-CM | POA: Diagnosis not present

## 2015-04-01 DIAGNOSIS — I069 Rheumatic aortic valve disease, unspecified: Secondary | ICD-10-CM | POA: Diagnosis not present

## 2015-04-01 DIAGNOSIS — E782 Mixed hyperlipidemia: Secondary | ICD-10-CM | POA: Diagnosis not present

## 2015-04-01 DIAGNOSIS — Z0001 Encounter for general adult medical examination with abnormal findings: Secondary | ICD-10-CM | POA: Diagnosis not present

## 2015-04-01 DIAGNOSIS — Z1389 Encounter for screening for other disorder: Secondary | ICD-10-CM | POA: Diagnosis not present

## 2015-04-02 ENCOUNTER — Other Ambulatory Visit (HOSPITAL_COMMUNITY): Payer: Self-pay | Admitting: Family Medicine

## 2015-04-02 DIAGNOSIS — Z23 Encounter for immunization: Secondary | ICD-10-CM | POA: Diagnosis not present

## 2015-04-02 DIAGNOSIS — Z0001 Encounter for general adult medical examination with abnormal findings: Secondary | ICD-10-CM | POA: Diagnosis not present

## 2015-04-02 DIAGNOSIS — M810 Age-related osteoporosis without current pathological fracture: Secondary | ICD-10-CM

## 2015-04-02 DIAGNOSIS — Z1389 Encounter for screening for other disorder: Secondary | ICD-10-CM | POA: Diagnosis not present

## 2015-04-08 ENCOUNTER — Ambulatory Visit (HOSPITAL_COMMUNITY)
Admission: RE | Admit: 2015-04-08 | Discharge: 2015-04-08 | Disposition: A | Discharge status: Home / Self Care | Payer: Medicare Other | Source: Ambulatory Visit | Attending: Family Medicine | Admitting: Family Medicine

## 2015-04-08 DIAGNOSIS — M810 Age-related osteoporosis without current pathological fracture: Secondary | ICD-10-CM

## 2015-04-08 DIAGNOSIS — Z780 Asymptomatic menopausal state: Secondary | ICD-10-CM | POA: Diagnosis not present

## 2015-04-08 DIAGNOSIS — Z13820 Encounter for screening for osteoporosis: Secondary | ICD-10-CM | POA: Diagnosis not present

## 2015-04-09 ENCOUNTER — Ambulatory Visit (INDEPENDENT_AMBULATORY_CARE_PROVIDER_SITE_OTHER): Payer: Medicare Other | Admitting: *Deleted

## 2015-04-09 DIAGNOSIS — Z954 Presence of other heart-valve replacement: Secondary | ICD-10-CM

## 2015-04-09 DIAGNOSIS — Z5181 Encounter for therapeutic drug level monitoring: Secondary | ICD-10-CM | POA: Diagnosis not present

## 2015-04-09 DIAGNOSIS — Z7901 Long term (current) use of anticoagulants: Secondary | ICD-10-CM

## 2015-04-09 DIAGNOSIS — Z952 Presence of prosthetic heart valve: Secondary | ICD-10-CM

## 2015-04-09 LAB — POCT INR: INR: 2.4

## 2015-04-14 ENCOUNTER — Ambulatory Visit (HOSPITAL_COMMUNITY)
Admission: RE | Admit: 2015-04-14 | Discharge: 2015-04-14 | Disposition: A | Discharge status: Home / Self Care | Payer: Medicare Other | Source: Ambulatory Visit | Attending: Family Medicine | Admitting: Family Medicine

## 2015-04-14 DIAGNOSIS — Z1231 Encounter for screening mammogram for malignant neoplasm of breast: Secondary | ICD-10-CM | POA: Diagnosis not present

## 2015-04-15 DIAGNOSIS — E1165 Type 2 diabetes mellitus with hyperglycemia: Secondary | ICD-10-CM | POA: Diagnosis not present

## 2015-04-15 DIAGNOSIS — H40053 Ocular hypertension, bilateral: Secondary | ICD-10-CM | POA: Diagnosis not present

## 2015-05-02 DIAGNOSIS — M5416 Radiculopathy, lumbar region: Secondary | ICD-10-CM | POA: Diagnosis not present

## 2015-05-02 DIAGNOSIS — M545 Low back pain: Secondary | ICD-10-CM | POA: Diagnosis not present

## 2015-05-02 DIAGNOSIS — M4316 Spondylolisthesis, lumbar region: Secondary | ICD-10-CM | POA: Diagnosis not present

## 2015-05-02 DIAGNOSIS — I10 Essential (primary) hypertension: Secondary | ICD-10-CM | POA: Diagnosis not present

## 2015-05-02 DIAGNOSIS — Z6841 Body Mass Index (BMI) 40.0 and over, adult: Secondary | ICD-10-CM | POA: Diagnosis not present

## 2015-05-14 ENCOUNTER — Ambulatory Visit (INDEPENDENT_AMBULATORY_CARE_PROVIDER_SITE_OTHER): Payer: Medicare Other | Admitting: *Deleted

## 2015-05-14 DIAGNOSIS — Z952 Presence of prosthetic heart valve: Secondary | ICD-10-CM

## 2015-05-14 DIAGNOSIS — Z5181 Encounter for therapeutic drug level monitoring: Secondary | ICD-10-CM

## 2015-05-14 DIAGNOSIS — Z7901 Long term (current) use of anticoagulants: Secondary | ICD-10-CM

## 2015-05-14 DIAGNOSIS — Z954 Presence of other heart-valve replacement: Secondary | ICD-10-CM | POA: Diagnosis not present

## 2015-05-14 LAB — POCT INR: INR: 3.8

## 2015-05-27 ENCOUNTER — Other Ambulatory Visit: Payer: Self-pay | Admitting: Cardiovascular Disease

## 2015-06-09 ENCOUNTER — Ambulatory Visit (INDEPENDENT_AMBULATORY_CARE_PROVIDER_SITE_OTHER): Payer: Medicare Other | Admitting: *Deleted

## 2015-06-09 DIAGNOSIS — Z954 Presence of other heart-valve replacement: Secondary | ICD-10-CM

## 2015-06-09 DIAGNOSIS — Z7901 Long term (current) use of anticoagulants: Secondary | ICD-10-CM

## 2015-06-09 DIAGNOSIS — Z952 Presence of prosthetic heart valve: Secondary | ICD-10-CM

## 2015-06-09 DIAGNOSIS — Z5181 Encounter for therapeutic drug level monitoring: Secondary | ICD-10-CM

## 2015-06-09 LAB — POCT INR: INR: 2.3

## 2015-06-18 ENCOUNTER — Telehealth: Payer: Self-pay | Admitting: Cardiovascular Disease

## 2015-06-18 DIAGNOSIS — I10 Essential (primary) hypertension: Secondary | ICD-10-CM | POA: Diagnosis not present

## 2015-06-23 NOTE — Telephone Encounter (Signed)
Close encounter 

## 2015-07-16 ENCOUNTER — Ambulatory Visit (INDEPENDENT_AMBULATORY_CARE_PROVIDER_SITE_OTHER): Payer: Medicare Other | Admitting: *Deleted

## 2015-07-16 DIAGNOSIS — Z952 Presence of prosthetic heart valve: Secondary | ICD-10-CM

## 2015-07-16 DIAGNOSIS — Z7901 Long term (current) use of anticoagulants: Secondary | ICD-10-CM

## 2015-07-16 DIAGNOSIS — Z954 Presence of other heart-valve replacement: Secondary | ICD-10-CM

## 2015-07-16 DIAGNOSIS — Z5181 Encounter for therapeutic drug level monitoring: Secondary | ICD-10-CM | POA: Diagnosis not present

## 2015-07-16 LAB — POCT INR: INR: 2.5

## 2015-08-22 DIAGNOSIS — I509 Heart failure, unspecified: Secondary | ICD-10-CM | POA: Diagnosis not present

## 2015-08-22 DIAGNOSIS — E875 Hyperkalemia: Secondary | ICD-10-CM | POA: Diagnosis not present

## 2015-08-22 DIAGNOSIS — N182 Chronic kidney disease, stage 2 (mild): Secondary | ICD-10-CM | POA: Diagnosis not present

## 2015-08-22 DIAGNOSIS — D649 Anemia, unspecified: Secondary | ICD-10-CM | POA: Diagnosis not present

## 2015-08-22 DIAGNOSIS — I10 Essential (primary) hypertension: Secondary | ICD-10-CM | POA: Diagnosis not present

## 2015-08-22 DIAGNOSIS — N183 Chronic kidney disease, stage 3 (moderate): Secondary | ICD-10-CM | POA: Diagnosis not present

## 2015-08-25 ENCOUNTER — Encounter: Payer: Self-pay | Admitting: Cardiovascular Disease

## 2015-08-25 ENCOUNTER — Ambulatory Visit (INDEPENDENT_AMBULATORY_CARE_PROVIDER_SITE_OTHER): Payer: Medicare Other | Admitting: Cardiovascular Disease

## 2015-08-25 VITALS — BP 132/90 | HR 71 | Resp 20 | Ht 62.5 in | Wt 267.6 lb

## 2015-08-25 DIAGNOSIS — Z7901 Long term (current) use of anticoagulants: Secondary | ICD-10-CM | POA: Diagnosis not present

## 2015-08-25 DIAGNOSIS — Z79899 Other long term (current) drug therapy: Secondary | ICD-10-CM

## 2015-08-25 DIAGNOSIS — Z8679 Personal history of other diseases of the circulatory system: Secondary | ICD-10-CM

## 2015-08-25 DIAGNOSIS — I10 Essential (primary) hypertension: Secondary | ICD-10-CM

## 2015-08-25 DIAGNOSIS — Z952 Presence of prosthetic heart valve: Secondary | ICD-10-CM

## 2015-08-25 DIAGNOSIS — Z9889 Other specified postprocedural states: Secondary | ICD-10-CM | POA: Diagnosis not present

## 2015-08-25 DIAGNOSIS — Z954 Presence of other heart-valve replacement: Secondary | ICD-10-CM | POA: Diagnosis not present

## 2015-08-25 DIAGNOSIS — E785 Hyperlipidemia, unspecified: Secondary | ICD-10-CM

## 2015-08-25 DIAGNOSIS — Z23 Encounter for immunization: Secondary | ICD-10-CM | POA: Diagnosis not present

## 2015-08-25 NOTE — Progress Notes (Signed)
Patient ID: [name], female   DOB: 1950/12/25, 64 y.o.   MRN: [id number]     Cardiology Office Note   Date:  08/25/2015   ID:  [name], DOB 1951/09/05, MRN [id number]  PCP:  Glo Herring., MD  Cardiologist:   Sanda Klein, MD   Chief Complaint  Patient presents with  . Annual Exam    PCP took her of crestor due to abnormal liver function tests.  No complaints of chest pain, SOB, edema or dizziness.  Having a lot of back pain.  Being treated by [facility].  May need a fusion in the future.      History of Present Illness: [name] is a 64 y.o. female who presents for  Follow-up of mechanical aortic valve replacement and ascending aortic aneurysm repair ([place] procedure , 2011 , 21 mm valve) performed for bicuspid aortic valve with critical aortic stenosis. Additional problems include hypertension, diabetes mellitus, mixed hyperlipidemia in the setting of morbid obesity (BMI 48).  Two years ago she underwent back surgery with some improvement , but she is contemplating a lumbar spine fusion procedure in the near future. She meets with her back surgeon in a couple of weeks. Back pain and bilateral knee pain are a more important limiting factor than dyspnea. She denies chest pain, palpitations, syncope, focal neurological complaints or serious bleeding problems. Anticoagulation is followed at [facility] by Edrick Oh.  When she had her surgery in 2014 she had enoxaparin "bridging" without incident.  About 6 months ago she developed liver test abnormalities and her rosuvastatin was stopped. Fenofibrate was continued. Liver function tests have improved. I don't have the original values, but just 3 days ago the AST was 46 and ALT 36 , barely above the upper limit of normal.    Past Medical History  Diagnosis Date  . Diabetes mellitus ([place])   . Hyperlipidemia   . AS (aortic stenosis) 09/18/2012    2D Echo EF 55%-60%  . AI (aortic  insufficiency)   . Tricuspid regurgitation   . PONV (postoperative nausea and vomiting)   . Cough   . Arthritis   . Obese   . Hypertension     dr Donetta Potts  . Chronic kidney disease     renal insuffcentily  . Renal insufficiency   . Coronary artery disease     Past Surgical History  Procedure Laterality Date  . Aortic valve replacement  04/01/2010    21-mm Sr Jude Regent ;mechanical valve   . Replacement  of ascending aortic aneurysm  04/01/2010    28-mm supracoronary Hemashield tube graft using deep hypothermic circulatory arrest  . Cardiac catheterization  02/09/2010  . Broken arm      wire in lt elbow  . Lumbar laminectomy/decompression microdiscectomy N/A 02/28/2013    Procedure: LUMBAR LAMINECTOMY/DECOMPRESSION MICRODISCECTOMY 2 LEVELS;  Surgeon: Ophelia Charter, MD;  Location: [facility];  Service: Neurosurgery;  Laterality: N/A;  Lumbar three-four and Lumbar four-five laminectomies  . Back surgery    . Fracture surgery       Current Outpatient Prescriptions  Medication Sig Dispense Refill  . ALPRAZolam (XANAX) 0.5 MG tablet Take 0.5 mg by mouth at bedtime as needed for sleep.     Marland Kitchen aspirin EC 81 MG tablet Take 81 mg by mouth at bedtime.    Marland Kitchen b complex vitamins tablet Take 1 tablet by mouth daily.    . bisacodyl (DULCOLAX) 5 MG EC tablet Take 5 mg by mouth  daily as needed for moderate constipation.    . fenofibrate (TRICOR) 145 MG tablet TAKE ONE TABLET BY MOUTH ONCE DAILY AT BEDTIME 30 tablet 11  . ferrous sulfate 325 (65 FE) MG tablet Take 325 mg by mouth 2 (two) times daily.    . furosemide (LASIX) 20 MG tablet TAKE ONE TABLET BY MOUTH ONCE DAILY 30 tablet 8  . glimepiride (AMARYL) 2 MG tablet Take 2 mg by mouth daily before breakfast.    . HYDROcodone-acetaminophen (NORCO/VICODIN) 5-325 MG per tablet Take 1 tablet by mouth every 6 (six) hours as needed for moderate pain.    Marland Kitchen lisinopril (PRINIVIL,ZESTRIL) 10 MG tablet Take 10 mg by mouth daily.    . metoprolol  (LOPRESSOR) 50 MG tablet TAKE ONE-HALF TABLET BY MOUTH TWICE DAILY 30 tablet 12  . Multiple Vitamin (MULTIVITAMIN WITH MINERALS) TABS Take 1 tablet by mouth daily.    . multivitamin-lutein (OCUVITE-LUTEIN) CAPS Take 1 capsule by mouth daily.    . Omega-3 Fatty Acids (FISH OIL) 1000 MG CAPS Take 1,000 mg by mouth daily.    Marland Kitchen warfarin (COUMADIN) 5 MG tablet TAKE ONE TABLET BY MOUTH ONCE DAILY OR AS DIRECTED 30 tablet 3  . zolpidem (AMBIEN) 10 MG tablet Take 10 mg by mouth at bedtime.      No current facility-administered medications for this visit.    Allergies:   Morphine and related    Social History:  The patient  reports that she has never smoked. She has never used smokeless tobacco. She reports that she does not drink alcohol or use illicit drugs.   Family History:  The patient's family history includes CAD in an other family member; Cancer - Lung in her maternal grandfather; Cancer - Prostate in her father; Diabetes in an other family member; Hypertension in an other family member.    ROS:  Please see the history of present illness.    Otherwise, review of systems positive for none.   All other systems are reviewed and negative.    PHYSICAL EXAM: VS:  BP 132/90 mmHg  Pulse 71  Resp 20  Ht 5' 2.5" (1.588 m)  Wt 267 lb 9.6 oz (121.383 kg)  BMI 48.13 kg/m2 , BMI Body mass index is 48.13 kg/(m^2).  General: Alert, oriented x3, no distress.  Some parts of her exam are obscured by super obesity Head: no evidence of trauma, PERRL, EOMI, no exophtalmos or lid lag, no myxedema, no xanthelasma; normal ears, nose and oropharynx Neck: normal jugular venous pulsations and no hepatojugular reflux; brisk carotid pulses without delay and no carotid bruits Chest: clear to auscultation, no signs of consolidation by percussion or palpation, normal fremitus, symmetrical and full respiratory excursions Cardiovascular:  Unable to identify the apical impulse, regular rhythm, normal first and second  heart sounds, no diastolic murmurs, rubs or gallops Abdomen: no tenderness or distention, normal bowel sounds, limited exam Extremities: no clubbing, cyanosis or edema; 2+ radial, ulnar and brachial pulses bilaterally; 2+ right femoral, posterior tibial and dorsalis pedis pulses; 2+ left femoral, posterior tibial and dorsalis pedis pulses; no subclavian or femoral bruits Neurological: grossly nonfocal Psych: euthymic mood, full affect   EKG:  EKG is ordered today. The ekg ordered today demonstrates  Normal sinus rhythm, nonspecific lateral T-wave inversion, not much change from before   Recent Labs:  normal electrolytes, LFTs as above with normal bilirubin and protein levels , creatinine 1.11, glucose 222 (nonfasting)  Lipid Panel    Component Value Date/Time   CHOL  168 08/06/2013 1008   TRIG 229* 08/06/2013 1008   HDL 54 08/06/2013 1008   CHOLHDL 3.1 08/06/2013 1008   VLDL 46* 08/06/2013 1008   LDLCALC 68 08/06/2013 1008      Wt Readings from Last 3 Encounters:  08/25/15 267 lb 9.6 oz (121.383 kg)  09/17/14 260 lb (117.935 kg)  08/23/14 262 lb 8 oz (119.069 kg)     ASSESSMENT AND PLAN:  1. S/p  Ascending aortic aneurysm repair and mechanical aortic valve , asymptomatic. Favorable findings on echocardiogram roughly one year ago. Normal left ventricular systolic function. No evidence of coronary artery disease by preoperative catheterization 2011. Anticoagulation followed in [place].   2.  Mixed hyperlipidemia. The combination of fibroid and statin may have been responsible for liver test abnormalities. Current minor residual elevation in transaminases may be fatty liver related. I have asked her to stop the fenofibrate and we will recheck baseline lipids off therapy. The first target should be reduction in LDL cholesterol less than 100, since she has diabetes mellitus. Would avoid using combination therapy again.  3.  Superobesity  4. Diabetes mellitus type 2 not requiring  insulin  5.  Preoperative risk evaluation. If she should undergo lumbar spine surgery would again recommend "bridging" with a low molecular weight heparin. Risk of embolic and bleeding complications is moderately increased, but not prohibitive    Current medicines are reviewed at length with the patient today.  The patient does not have concerns regarding medicines.  The following changes have been made:  Stop fenofibrate  Labs/ tests ordered today include:  Orders Placed This Encounter  Procedures  . Lipid panel  . EKG 12-Lead    There are no Patient Instructions on file for this visit.    Mikael Spray, MD  08/25/2015 8:22 AM    Sanda Klein, MD, [facility] [phone] office [phone] pager

## 2015-08-25 NOTE — Patient Instructions (Signed)
Your physician has recommended you make the following change in your medication: STOP FENOFIBRATE ([place])  Your physician recommends that you return for lab work in: [facility]  If you need a refill on your cardiac medications before your next appointment, please call your pharmacy.  Dr. Sallyanne Kuster recommends that you schedule a follow-up appointment in: [place]

## 2015-08-27 DIAGNOSIS — N183 Chronic kidney disease, stage 3 (moderate): Secondary | ICD-10-CM | POA: Diagnosis not present

## 2015-08-27 DIAGNOSIS — I509 Heart failure, unspecified: Secondary | ICD-10-CM | POA: Diagnosis not present

## 2015-08-27 DIAGNOSIS — E559 Vitamin D deficiency, unspecified: Secondary | ICD-10-CM | POA: Diagnosis not present

## 2015-08-27 DIAGNOSIS — I10 Essential (primary) hypertension: Secondary | ICD-10-CM | POA: Diagnosis not present

## 2015-08-30 ENCOUNTER — Other Ambulatory Visit: Payer: Self-pay | Admitting: Cardiovascular Disease

## 2015-09-01 ENCOUNTER — Ambulatory Visit (INDEPENDENT_AMBULATORY_CARE_PROVIDER_SITE_OTHER): Payer: Medicare Other | Admitting: *Deleted

## 2015-09-01 DIAGNOSIS — Z954 Presence of other heart-valve replacement: Secondary | ICD-10-CM | POA: Diagnosis not present

## 2015-09-01 DIAGNOSIS — Z5181 Encounter for therapeutic drug level monitoring: Secondary | ICD-10-CM | POA: Diagnosis not present

## 2015-09-01 DIAGNOSIS — Z952 Presence of prosthetic heart valve: Secondary | ICD-10-CM

## 2015-09-01 DIAGNOSIS — Z7901 Long term (current) use of anticoagulants: Secondary | ICD-10-CM | POA: Diagnosis not present

## 2015-09-01 LAB — POCT INR: INR: 2.2

## 2015-09-02 DIAGNOSIS — Z6841 Body Mass Index (BMI) 40.0 and over, adult: Secondary | ICD-10-CM | POA: Diagnosis not present

## 2015-09-02 DIAGNOSIS — M4316 Spondylolisthesis, lumbar region: Secondary | ICD-10-CM | POA: Diagnosis not present

## 2015-09-30 ENCOUNTER — Other Ambulatory Visit: Payer: Self-pay | Admitting: Cardiovascular Disease

## 2015-09-30 NOTE — Telephone Encounter (Signed)
Rx request sent to pharmacy.  

## 2015-10-13 ENCOUNTER — Ambulatory Visit (INDEPENDENT_AMBULATORY_CARE_PROVIDER_SITE_OTHER): Payer: Medicare Other | Admitting: *Deleted

## 2015-10-13 DIAGNOSIS — Z954 Presence of other heart-valve replacement: Secondary | ICD-10-CM | POA: Diagnosis not present

## 2015-10-13 DIAGNOSIS — Z952 Presence of prosthetic heart valve: Secondary | ICD-10-CM

## 2015-10-13 DIAGNOSIS — Z5181 Encounter for therapeutic drug level monitoring: Secondary | ICD-10-CM

## 2015-10-13 DIAGNOSIS — Z7901 Long term (current) use of anticoagulants: Secondary | ICD-10-CM

## 2015-10-13 LAB — POCT INR: INR: 1.5

## 2015-10-14 DIAGNOSIS — E559 Vitamin D deficiency, unspecified: Secondary | ICD-10-CM | POA: Diagnosis not present

## 2015-10-14 DIAGNOSIS — Z1389 Encounter for screening for other disorder: Secondary | ICD-10-CM | POA: Diagnosis not present

## 2015-10-14 DIAGNOSIS — E1129 Type 2 diabetes mellitus with other diabetic kidney complication: Secondary | ICD-10-CM | POA: Diagnosis not present

## 2015-10-14 DIAGNOSIS — E611 Iron deficiency: Secondary | ICD-10-CM | POA: Diagnosis not present

## 2015-10-14 DIAGNOSIS — Z6841 Body Mass Index (BMI) 40.0 and over, adult: Secondary | ICD-10-CM | POA: Diagnosis not present

## 2015-10-14 DIAGNOSIS — M67432 Ganglion, left wrist: Secondary | ICD-10-CM | POA: Diagnosis not present

## 2015-10-14 DIAGNOSIS — L659 Nonscarring hair loss, unspecified: Secondary | ICD-10-CM | POA: Diagnosis not present

## 2015-10-29 ENCOUNTER — Other Ambulatory Visit: Payer: Self-pay | Admitting: Cardiovascular Disease

## 2015-11-03 ENCOUNTER — Ambulatory Visit (INDEPENDENT_AMBULATORY_CARE_PROVIDER_SITE_OTHER): Payer: Medicare Other | Admitting: *Deleted

## 2015-11-03 DIAGNOSIS — Z952 Presence of prosthetic heart valve: Secondary | ICD-10-CM

## 2015-11-03 DIAGNOSIS — Z7901 Long term (current) use of anticoagulants: Secondary | ICD-10-CM | POA: Diagnosis not present

## 2015-11-03 DIAGNOSIS — Z5181 Encounter for therapeutic drug level monitoring: Secondary | ICD-10-CM

## 2015-11-03 DIAGNOSIS — Z954 Presence of other heart-valve replacement: Secondary | ICD-10-CM

## 2015-11-03 LAB — POCT INR: INR: 1.3

## 2015-11-10 DIAGNOSIS — E785 Hyperlipidemia, unspecified: Secondary | ICD-10-CM | POA: Diagnosis not present

## 2015-11-14 DIAGNOSIS — M67432 Ganglion, left wrist: Secondary | ICD-10-CM | POA: Diagnosis not present

## 2015-11-14 DIAGNOSIS — M19041 Primary osteoarthritis, right hand: Secondary | ICD-10-CM | POA: Diagnosis not present

## 2015-11-14 DIAGNOSIS — M1812 Unilateral primary osteoarthritis of first carpometacarpal joint, left hand: Secondary | ICD-10-CM | POA: Diagnosis not present

## 2015-11-17 ENCOUNTER — Ambulatory Visit (INDEPENDENT_AMBULATORY_CARE_PROVIDER_SITE_OTHER): Payer: Medicare Other | Admitting: *Deleted

## 2015-11-17 DIAGNOSIS — Z954 Presence of other heart-valve replacement: Secondary | ICD-10-CM | POA: Diagnosis not present

## 2015-11-17 DIAGNOSIS — Z7901 Long term (current) use of anticoagulants: Secondary | ICD-10-CM | POA: Diagnosis not present

## 2015-11-17 DIAGNOSIS — Z952 Presence of prosthetic heart valve: Secondary | ICD-10-CM

## 2015-11-17 DIAGNOSIS — Z5181 Encounter for therapeutic drug level monitoring: Secondary | ICD-10-CM | POA: Diagnosis not present

## 2015-11-17 LAB — POCT INR: INR: 1.5

## 2015-11-20 DIAGNOSIS — M67432 Ganglion, left wrist: Secondary | ICD-10-CM | POA: Diagnosis not present

## 2015-11-20 DIAGNOSIS — Z0389 Encounter for observation for other suspected diseases and conditions ruled out: Secondary | ICD-10-CM | POA: Diagnosis not present

## 2015-11-24 DIAGNOSIS — M67432 Ganglion, left wrist: Secondary | ICD-10-CM | POA: Diagnosis not present

## 2015-11-26 ENCOUNTER — Other Ambulatory Visit: Payer: Self-pay | Admitting: Orthopedic Surgery

## 2015-12-01 ENCOUNTER — Ambulatory Visit (INDEPENDENT_AMBULATORY_CARE_PROVIDER_SITE_OTHER): Payer: Medicare Other | Admitting: *Deleted

## 2015-12-01 DIAGNOSIS — Z5181 Encounter for therapeutic drug level monitoring: Secondary | ICD-10-CM

## 2015-12-01 DIAGNOSIS — Z954 Presence of other heart-valve replacement: Secondary | ICD-10-CM

## 2015-12-01 DIAGNOSIS — Z7901 Long term (current) use of anticoagulants: Secondary | ICD-10-CM

## 2015-12-01 DIAGNOSIS — Z952 Presence of prosthetic heart valve: Secondary | ICD-10-CM

## 2015-12-01 LAB — POCT INR: INR: 2.1

## 2015-12-04 DIAGNOSIS — I10 Essential (primary) hypertension: Secondary | ICD-10-CM | POA: Diagnosis not present

## 2015-12-04 DIAGNOSIS — M4316 Spondylolisthesis, lumbar region: Secondary | ICD-10-CM | POA: Diagnosis not present

## 2015-12-04 DIAGNOSIS — Z6841 Body Mass Index (BMI) 40.0 and over, adult: Secondary | ICD-10-CM | POA: Diagnosis not present

## 2015-12-06 ENCOUNTER — Encounter (HOSPITAL_COMMUNITY): Payer: Self-pay | Admitting: *Deleted

## 2015-12-06 ENCOUNTER — Emergency Department (HOSPITAL_COMMUNITY)
Admission: EM | Admit: 2015-12-06 | Discharge: 2015-12-07 | Disposition: A | Discharge status: Home / Self Care | Payer: Medicare Other | Attending: Emergency Medicine | Admitting: Emergency Medicine

## 2015-12-06 DIAGNOSIS — M25571 Pain in right ankle and joints of right foot: Secondary | ICD-10-CM | POA: Diagnosis not present

## 2015-12-06 DIAGNOSIS — M79671 Pain in right foot: Secondary | ICD-10-CM | POA: Diagnosis not present

## 2015-12-06 DIAGNOSIS — R110 Nausea: Secondary | ICD-10-CM | POA: Diagnosis not present

## 2015-12-06 DIAGNOSIS — Z7984 Long term (current) use of oral hypoglycemic drugs: Secondary | ICD-10-CM | POA: Diagnosis not present

## 2015-12-06 DIAGNOSIS — R52 Pain, unspecified: Secondary | ICD-10-CM | POA: Diagnosis not present

## 2015-12-06 DIAGNOSIS — R509 Fever, unspecified: Secondary | ICD-10-CM | POA: Diagnosis not present

## 2015-12-06 DIAGNOSIS — E1122 Type 2 diabetes mellitus with diabetic chronic kidney disease: Secondary | ICD-10-CM | POA: Diagnosis not present

## 2015-12-06 DIAGNOSIS — R2241 Localized swelling, mass and lump, right lower limb: Secondary | ICD-10-CM | POA: Diagnosis not present

## 2015-12-06 DIAGNOSIS — M79604 Pain in right leg: Secondary | ICD-10-CM | POA: Insufficient documentation

## 2015-12-06 DIAGNOSIS — R609 Edema, unspecified: Secondary | ICD-10-CM | POA: Diagnosis not present

## 2015-12-06 DIAGNOSIS — N189 Chronic kidney disease, unspecified: Secondary | ICD-10-CM | POA: Diagnosis not present

## 2015-12-06 DIAGNOSIS — I129 Hypertensive chronic kidney disease with stage 1 through stage 4 chronic kidney disease, or unspecified chronic kidney disease: Secondary | ICD-10-CM | POA: Diagnosis not present

## 2015-12-06 DIAGNOSIS — I2510 Atherosclerotic heart disease of native coronary artery without angina pectoris: Secondary | ICD-10-CM | POA: Insufficient documentation

## 2015-12-06 DIAGNOSIS — M7989 Other specified soft tissue disorders: Secondary | ICD-10-CM | POA: Diagnosis present

## 2015-12-06 MED ORDER — ONDANSETRON HCL 4 MG/2ML IJ SOLN
4.0000 mg | Freq: Once | INTRAMUSCULAR | Status: AC
  Administered 2015-12-07: 4 mg via INTRAVENOUS
  Filled 2015-12-06: qty 2

## 2015-12-06 MED ORDER — HYDROMORPHONE HCL 1 MG/ML IJ SOLN
0.5000 mg | Freq: Once | INTRAMUSCULAR | Status: DC
  Filled 2015-12-06: qty 1

## 2015-12-06 NOTE — ED Notes (Addendum)
Pt reports nausea and right foot swelling since Monday. Pt states she has an appt with Dr. Berline Lopes, podiatrist in [place] on Monday. +pedal pulses

## 2015-12-07 ENCOUNTER — Emergency Department (HOSPITAL_COMMUNITY): Payer: Medicare Other

## 2015-12-07 DIAGNOSIS — M7989 Other specified soft tissue disorders: Secondary | ICD-10-CM | POA: Diagnosis not present

## 2015-12-07 DIAGNOSIS — M79604 Pain in right leg: Secondary | ICD-10-CM | POA: Diagnosis not present

## 2015-12-07 DIAGNOSIS — M79671 Pain in right foot: Secondary | ICD-10-CM | POA: Diagnosis not present

## 2015-12-07 LAB — COMPREHENSIVE METABOLIC PANEL
ALT: 39 U/L (ref 14–54)
AST: 46 U/L — ABNORMAL HIGH (ref 15–41)
Albumin: 4.1 g/dL (ref 3.5–5.0)
Alkaline Phosphatase: 52 U/L (ref 38–126)
Anion gap: 10 (ref 5–15)
BUN: 16 mg/dL (ref 6–20)
CO2: 28 mmol/L (ref 22–32)
Calcium: 10 mg/dL (ref 8.9–10.3)
Chloride: 99 mmol/L — ABNORMAL LOW (ref 101–111)
Creatinine, Ser: 1.03 mg/dL — ABNORMAL HIGH (ref 0.44–1.00)
GFR calc Af Amer: >60 mL/min (ref >60)
GFR calc non Af Amer: 55 mL/min — ABNORMAL LOW (ref >60)
Glucose, Bld: 168 mg/dL — ABNORMAL HIGH (ref 65–99)
Potassium: 4.1 mmol/L (ref 3.5–5.1)
Sodium: 137 mmol/L (ref 135–145)
Total Bilirubin: 0.9 mg/dL (ref 0.3–1.2)
Total Protein: 8 g/dL (ref 6.5–8.1)

## 2015-12-07 LAB — CBC WITH DIFFERENTIAL/PLATELET
Basophils Absolute: 0 10*3/uL (ref 0.0–0.1)
Basophils Relative: 0 %
Eosinophils Absolute: 0.1 10*3/uL (ref 0.0–0.7)
Eosinophils Relative: 1 %
HCT: 45.8 % (ref 36.0–46.0)
Hemoglobin: 15.2 g/dL — ABNORMAL HIGH (ref 12.0–15.0)
Lymphocytes Relative: 10 %
Lymphs Abs: 1.3 10*3/uL (ref 0.7–4.0)
MCH: 30.6 pg (ref 26.0–34.0)
MCHC: 33.2 g/dL (ref 30.0–36.0)
MCV: 92.3 fL (ref 78.0–100.0)
Monocytes Absolute: 1 10*3/uL (ref 0.1–1.0)
Monocytes Relative: 8 %
Neutro Abs: 10.2 10*3/uL — ABNORMAL HIGH (ref 1.7–7.7)
Neutrophils Relative %: 81 %
Platelets: 234 10*3/uL (ref 150–400)
RBC: 4.96 MIL/uL (ref 3.87–5.11)
RDW: 12.7 % (ref 11.5–15.5)
WBC: 12.7 10*3/uL — ABNORMAL HIGH (ref 4.0–10.5)

## 2015-12-07 LAB — URIC ACID: Uric Acid, Serum: 10.8 mg/dL — ABNORMAL HIGH (ref 2.3–6.6)

## 2015-12-07 LAB — LACTIC ACID, PLASMA: Lactic Acid, Venous: 1.7 mmol/L (ref 0.5–2.0)

## 2015-12-07 LAB — PROTIME-INR
INR: 2.03 — ABNORMAL HIGH (ref 0.00–1.49)
Prothrombin Time: 22.8 seconds — ABNORMAL HIGH (ref 11.6–15.2)

## 2015-12-07 MED ORDER — HYDROMORPHONE HCL 1 MG/ML IJ SOLN
0.5000 mg | Freq: Once | INTRAMUSCULAR | Status: AC
  Administered 2015-12-07: 0.5 mg via INTRAVENOUS
  Filled 2015-12-07: qty 1

## 2015-12-07 MED ORDER — OXYCODONE-ACETAMINOPHEN 5-325 MG PO TABS: 1.0000 | ORAL_TABLET | ORAL | Status: DC | PRN

## 2015-12-07 MED ORDER — DOXYCYCLINE HYCLATE 100 MG PO CAPS: 100.0000 mg | ORAL_CAPSULE | Freq: Two times a day (BID) | ORAL | Status: DC

## 2015-12-07 MED ORDER — DOXYCYCLINE HYCLATE 100 MG PO TABS
100.0000 mg | ORAL_TABLET | Freq: Once | ORAL | Status: AC
  Administered 2015-12-07: 100 mg via ORAL
  Filled 2015-12-07: qty 1

## 2015-12-07 MED ORDER — FENTANYL CITRATE (PF) 100 MCG/2ML IJ SOLN
50.0000 ug | Freq: Once | INTRAMUSCULAR | Status: AC
  Administered 2015-12-07: 50 ug via INTRAVENOUS
  Filled 2015-12-07: qty 2

## 2015-12-07 NOTE — ED Provider Notes (Signed)
CSN: [id number]     Arrival date & time 12/06/15  2311 History   First MD Initiated Contact with Patient 12/06/15 2327     Chief Complaint  Patient presents with  . Foot Swelling     (Consider location/radiation/quality/duration/timing/severity/associated sxs/prior Treatment) The history is provided by the patient and the spouse.   MAGDALYNN Costa is a 65 y.o. female with a past medical history below, most significant for DM (does not routinely check her cbg's) presenting with a 6 day history of right foot pain which started out mild and in her distal toes, and has now progressed to include her entire foot which has become significantly more swollen and is now red and feels warm to touch since yesterday.  She denies injury or falls, reports had a right foot fracture several years ago, and presents today wearing her old cam walker as she can not fit a shoe on her foot given the swelling and pain.  She denies fevers but has been shaking cold all day and also now endorses nausea without emesis.  She has taken her home hydrocodone last dose 5 pm without pain relief. She does not have a have a personal history of gout - had foot pain several years ago but was never tested for this problem.   ]  Past Medical History  Diagnosis Date  . Diabetes mellitus ([place])   . Hyperlipidemia   . AS (aortic stenosis) 09/18/2012    2D Echo EF 55%-60%  . AI (aortic insufficiency)   . Tricuspid regurgitation   . PONV (postoperative nausea and vomiting)   . Cough   . Arthritis   . Obese   . Hypertension     dr Donetta Potts  . Chronic kidney disease     renal insuffcentily  . Renal insufficiency   . Coronary artery disease    Past Surgical History  Procedure Laterality Date  . Aortic valve replacement  04/01/2010    21-mm Sr Jude Regent ;mechanical valve   . Replacement  of ascending aortic aneurysm  04/01/2010    28-mm supracoronary Hemashield tube graft using deep hypothermic circulatory arrest  .  Cardiac catheterization  02/09/2010  . Broken arm      wire in lt elbow  . Lumbar laminectomy/decompression microdiscectomy N/A 02/28/2013    Procedure: LUMBAR LAMINECTOMY/DECOMPRESSION MICRODISCECTOMY 2 LEVELS;  Surgeon: Ophelia Charter, MD;  Location: [place] [facility];  Service: Neurosurgery;  Laterality: N/A;  Lumbar three-four and Lumbar four-five laminectomies  . Back surgery    . Fracture surgery     Family History  Problem Relation Age of Onset  . CAD    . Cancer - Prostate Father   . Cancer - Lung Maternal Grandfather   . Diabetes    . Hypertension     Social History  Substance Use Topics  . Smoking status: Never Smoker   . Smokeless tobacco: Never Used  . Alcohol Use: No   OB History    No data available     Review of Systems  Constitutional: Positive for chills. Negative for fever.  HENT: Negative.   Gastrointestinal: Positive for nausea. Negative for vomiting and abdominal pain.  Musculoskeletal: Positive for joint swelling and arthralgias. Negative for myalgias.  Skin: Positive for color change. Negative for wound.  Neurological: Negative for weakness and numbness.      Allergies  Morphine and related  Home Medications   Prior to Admission medications   Medication Sig Start Date End Date Taking? Authorizing  Provider  ALPRAZolam Duanne Moron) 0.5 MG tablet Take 0.5 mg by mouth at bedtime as needed for sleep.     Historical Provider, MD  aspirin EC 81 MG tablet Take 81 mg by mouth at bedtime.    Historical Provider, MD  b complex vitamins tablet Take 1 tablet by mouth daily.    Historical Provider, MD  bisacodyl (DULCOLAX) 5 MG EC tablet Take 5 mg by mouth daily as needed for moderate constipation.    Historical Provider, MD  doxycycline (VIBRAMYCIN) 100 MG capsule Take 1 capsule (100 mg total) by mouth 2 (two) times daily. 12/07/15   Evalee Jefferson, PA-C  ferrous sulfate 325 (65 FE) MG tablet Take 325 mg by mouth 2 (two) times daily.    Historical Provider, MD   furosemide (LASIX) 20 MG tablet TAKE ONE TABLET BY MOUTH ONCE DAILY 09/01/15   Mihai Croitoru, MD  glimepiride (AMARYL) 2 MG tablet Take 2 mg by mouth daily before breakfast.    Historical Provider, MD  HYDROcodone-acetaminophen (NORCO/VICODIN) 5-325 MG per tablet Take 1 tablet by mouth every 6 (six) hours as needed for moderate pain.    Historical Provider, MD  lisinopril (PRINIVIL,ZESTRIL) 10 MG tablet Take 10 mg by mouth daily.    Historical Provider, MD  metoprolol (LOPRESSOR) 50 MG tablet TAKE ONE-HALF TABLET BY MOUTH TWICE DAILY 09/30/15   Mihai Croitoru, MD  Multiple Vitamin (MULTIVITAMIN WITH MINERALS) TABS Take 1 tablet by mouth daily.    Historical Provider, MD  multivitamin-lutein [facility]) CAPS Take 1 capsule by mouth daily.    Historical Provider, MD  Omega-3 Fatty Acids (FISH OIL) 1000 MG CAPS Take 1,000 mg by mouth daily.    Historical Provider, MD  oxyCODONE-acetaminophen (PERCOCET/ROXICET) 5-325 MG tablet Take 1 tablet by mouth every 4 (four) hours as needed. 12/07/15   Evalee Jefferson, PA-C  warfarin (COUMADIN) 5 MG tablet TAKE ONE TABLET BY MOUTH ONCE DAILY OR AS DIRECTED 10/29/15   Mihai Croitoru, MD  zolpidem (AMBIEN) 10 MG tablet Take 10 mg by mouth at bedtime.     Historical Provider, MD   BP 160/66 mmHg  Pulse 81  Temp(Src) 98.6 F (37 C) (Oral)  Resp 14  Ht 5\' 2"  (1.575 m)  Wt 121.11 kg  BMI 48.82 kg/m2  SpO2 89% Physical Exam  Constitutional: She appears well-developed and well-nourished.  HENT:  Head: Atraumatic.  Neck: Normal range of motion.  Cardiovascular:  Pulses equal bilaterally  Musculoskeletal: She exhibits edema and tenderness.  Exquisite pain of entire right foot. Advanced edema of entire dorsal foot including toes, erythema and increased warmth.  Dorsalis pedal pulse present.  Calf is soft and nontender with no erythema.   Neurological: She is alert. She has normal strength. She displays normal reflexes. No sensory deficit.  Skin: Skin is warm and  dry.  Psychiatric: She has a normal mood and affect.    ED Course  Procedures (including critical care time) Labs Review Labs Reviewed  CBC WITH DIFFERENTIAL/PLATELET - Abnormal; Notable for the following:    WBC 12.7 (*)    Hemoglobin 15.2 (*)    Neutro Abs 10.2 (*)    All other components within normal limits  COMPREHENSIVE METABOLIC PANEL - Abnormal; Notable for the following:    Chloride 99 (*)    Glucose, Bld 168 (*)    Creatinine, Ser 1.03 (*)    AST 46 (*)    GFR calc non Af Amer 55 (*)    All other components within normal limits  PROTIME-INR - Abnormal; Notable for the following:    Prothrombin Time 22.8 (*)    INR 2.03 (*)    All other components within normal limits  URIC ACID - Abnormal; Notable for the following:    Uric Acid, Serum 10.8 (*)    All other components within normal limits  LACTIC ACID, PLASMA    Imaging Review No results found. I have personally reviewed and evaluated these images and lab results as part of my medical decision-making.   EKG Interpretation   Date/Time:  Saturday December 06 2015 23:17:36 EDT Ventricular Rate:  82 PR Interval:  171 QRS Duration: 87 QT Interval:  372 QTC Calculation: 434 R Axis:   17 Text Interpretation:  Sinus rhythm Probable left atrial enlargement Low  voltage, precordial leads Nonspecific T abnrm, anterolateral leads  Baseline wander in lead(s) I III aVL Abnormal ekg Confirmed by Christy Gentles   MD, DONALD ([id number]) on 12/06/2015 11:25:00 PM      MDM   Final diagnoses:  Foot pain, right    Pt with exquisite right foot pain, swelling and redness.  Ddx including stress fx, infection, gout.  Labs reviewed with elevated wbc, also elevated uric acid level.  Discussed with Dr Christy Gentles who also saw patient and helped formulate plan.  She was placed on doxycycline, advised warm compresses, elevation, oxycodone in place of hydrocodone for pain relief.  Pt advised to return here in 2 days to reassess.  Exam most c/w  infectious process so placed doxycycline.  If equivocal response may need to consider adding gout medication.  She is on coumadin so choices are limited.  Evalee Jefferson, PA-C 12/07/15 0104  Ripley Fraise, MD 12/07/15 [phone]

## 2015-12-07 NOTE — Discharge Instructions (Signed)
You are being treated for a possible infection in your foot tonight.  Take the next dose of the antibiotic (doxycycline) tomorrow morning.  Elevate and apply warm compresses to your foot as much as is comfortable for the next several days.  You may take the oxycodone in place of your hydrocodone which should give you better pain control.  Your uric acid is elevated tonight, which could imply the possibility of gout as well, but infection is the more likely problem.  Return here for a recheck of your symptoms in 2 days as discussed.

## 2015-12-07 NOTE — ED Provider Notes (Signed)
Patient seen/examined in the Emergency Department in conjunction with Midlevel Provider Idol Patient reports right foot pain/swelling Exam : awake/alert, right foot is warm to touch, diffuse tenderness but no crepitus or bruising.  No open skin is noted Plan: will treat for cellulitis. Will also need subtherapeutic coumadin level addressed Will need recheck in the ER on 3/20   Ripley Fraise, MD 12/07/15 [phone]

## 2015-12-08 DIAGNOSIS — M10071 Idiopathic gout, right ankle and foot: Secondary | ICD-10-CM | POA: Diagnosis not present

## 2015-12-08 DIAGNOSIS — M79671 Pain in right foot: Secondary | ICD-10-CM | POA: Diagnosis not present

## 2015-12-15 DIAGNOSIS — M25579 Pain in unspecified ankle and joints of unspecified foot: Secondary | ICD-10-CM | POA: Diagnosis not present

## 2015-12-15 DIAGNOSIS — M79671 Pain in right foot: Secondary | ICD-10-CM | POA: Diagnosis not present

## 2015-12-22 ENCOUNTER — Ambulatory Visit (INDEPENDENT_AMBULATORY_CARE_PROVIDER_SITE_OTHER): Payer: Medicare Other | Admitting: *Deleted

## 2015-12-22 DIAGNOSIS — Z5181 Encounter for therapeutic drug level monitoring: Secondary | ICD-10-CM | POA: Diagnosis not present

## 2015-12-22 DIAGNOSIS — Z954 Presence of other heart-valve replacement: Secondary | ICD-10-CM | POA: Diagnosis not present

## 2015-12-22 DIAGNOSIS — Z7901 Long term (current) use of anticoagulants: Secondary | ICD-10-CM | POA: Diagnosis not present

## 2015-12-22 DIAGNOSIS — Z952 Presence of prosthetic heart valve: Secondary | ICD-10-CM

## 2015-12-22 LAB — POCT INR: INR: 3.3

## 2015-12-31 ENCOUNTER — Encounter (HOSPITAL_BASED_OUTPATIENT_CLINIC_OR_DEPARTMENT_OTHER): Payer: Self-pay | Admitting: *Deleted

## 2015-12-31 DIAGNOSIS — Z79899 Other long term (current) drug therapy: Secondary | ICD-10-CM | POA: Diagnosis not present

## 2015-12-31 DIAGNOSIS — N183 Chronic kidney disease, stage 3 (moderate): Secondary | ICD-10-CM | POA: Diagnosis not present

## 2015-12-31 DIAGNOSIS — R809 Proteinuria, unspecified: Secondary | ICD-10-CM | POA: Diagnosis not present

## 2015-12-31 DIAGNOSIS — E559 Vitamin D deficiency, unspecified: Secondary | ICD-10-CM | POA: Diagnosis not present

## 2015-12-31 DIAGNOSIS — D509 Iron deficiency anemia, unspecified: Secondary | ICD-10-CM | POA: Diagnosis not present

## 2015-12-31 DIAGNOSIS — I10 Essential (primary) hypertension: Secondary | ICD-10-CM | POA: Diagnosis not present

## 2015-12-31 NOTE — Progress Notes (Signed)
Chart reviewed by Dr Al Corpus, [place] [facility] Patient will come in for anesthesia consult for BMI. Dr Levell July office will notify pt of when to stop Coumadin.

## 2016-01-01 ENCOUNTER — Telehealth: Payer: Self-pay | Admitting: *Deleted

## 2016-01-01 DIAGNOSIS — M545 Low back pain: Secondary | ICD-10-CM | POA: Diagnosis not present

## 2016-01-01 DIAGNOSIS — Z6841 Body Mass Index (BMI) 40.0 and over, adult: Secondary | ICD-10-CM | POA: Diagnosis not present

## 2016-01-01 DIAGNOSIS — Z1389 Encounter for screening for other disorder: Secondary | ICD-10-CM | POA: Diagnosis not present

## 2016-01-01 DIAGNOSIS — M1990 Unspecified osteoarthritis, unspecified site: Secondary | ICD-10-CM | POA: Diagnosis not present

## 2016-01-01 NOTE — Telephone Encounter (Signed)
Patient states she received call from [facility] at Dr Levell July office.  States Jeani Hawking has been trying to get in touch with a pharmacist about holding pt's coumadin for upcoming surgery for removal of volar cyst lt wrist.  Reviewed chart.  Pt has St Jude Aortic Valve with no history of stroke or Atrial fibrillation.  Per protocol patient is low risk and can hold coumadin without Lovenox bridge.  Will let Dr Fredna Dow determine if he wants to hold coumadin 3 days or 5 days prior to surgery. Pt should resume coumadin night of surgery if stable and have INR checked in 7 days.   Message faxed to [facility] at Dr Levell July office.

## 2016-01-02 ENCOUNTER — Telehealth: Payer: Self-pay

## 2016-01-02 NOTE — Telephone Encounter (Signed)
Request for surgical clearance:   1. What type surgery is being performed? Excision volar cyst left wrist, excision mucoid tumor, debridement DIP left index finger  2. When is this surgery scheduled? 01/08/16  3. Are there any medications that need to be held prior to surgery and how long? Warfarin 5 mg  4. Name of the physician performing surgery: Dr Daryll Brod  5. What is the office phone and fax number? [facility]  Phone [phone]  Fax [phone]

## 2016-01-04 ENCOUNTER — Encounter: Payer: Self-pay | Admitting: Cardiovascular Disease

## 2016-01-04 NOTE — Telephone Encounter (Signed)
Sent via [employer]. Lattie Haw, could you please help [name] coordinate the Lovenox bridge? I know she has self administered Lovenox for periop bridging at least once before. Thank you

## 2016-01-05 ENCOUNTER — Ambulatory Visit (INDEPENDENT_AMBULATORY_CARE_PROVIDER_SITE_OTHER): Payer: Medicare Other | Admitting: *Deleted

## 2016-01-05 DIAGNOSIS — Z7901 Long term (current) use of anticoagulants: Secondary | ICD-10-CM | POA: Diagnosis not present

## 2016-01-05 DIAGNOSIS — Z954 Presence of other heart-valve replacement: Secondary | ICD-10-CM | POA: Diagnosis not present

## 2016-01-05 DIAGNOSIS — Z952 Presence of prosthetic heart valve: Secondary | ICD-10-CM

## 2016-01-05 DIAGNOSIS — Z5181 Encounter for therapeutic drug level monitoring: Secondary | ICD-10-CM

## 2016-01-05 LAB — POCT INR: INR: 2

## 2016-01-05 NOTE — Patient Instructions (Signed)
Wt. 267  SrCr 1.03  CrCl 102.72  Hgb: 15.2  4/16  Last dose of coumadin 4/17  No lovenox or coumadin 4/18  Lovenox 120mg  sq bid at 8am and 8pm 4/19  Lovenox 120mg  sq bid at 8am and 8pm 4/20  No Lovenox ---------Surgery ------coumadin 7.5mg  and lovenox 120mg  pm if ok with surgeon 4/21  Lovenox 120mg  sq at 8am and coumadin 7.5mg  and lovenox 120mg  pm 4/22  Lovenox 120mg  sq at 8am and coumadin 7.5mg  and lovenox 120mg  pm 4/23  Lovenox 120mg  sq at 8am and coumadin 5mg  and lovenox 120mg  pm 4/24  Lovenox 120mg  sq at 8am -----INR appt  Enoxaparin 120mg  sq q 12 called in to [facility] # 12 syringes x 1 refill.  Pt aware.

## 2016-01-05 NOTE — Telephone Encounter (Signed)
DR C, Per anticoagulation protocol pt's with a St Jude AVR without history of TIA/CVA or Atrial fib do not need to be bridged with Lovenox as they are considered low risk.  Are you OK with follow protocol or do want me to bridge her anyway? Thanks, Lattie Haw

## 2016-01-05 NOTE — Telephone Encounter (Signed)
Pt took last dose of coumadin 01/04/16.  She will be bridged with Lovenox 120mg  twice daily.  See anticoagulation note from 01/05/16 for bridging schedule.

## 2016-01-06 NOTE — Telephone Encounter (Signed)
Telephone encounter, letter, and anticoag visit with bridging information faxed to [facility] at [place].

## 2016-01-07 ENCOUNTER — Encounter (HOSPITAL_BASED_OUTPATIENT_CLINIC_OR_DEPARTMENT_OTHER)
Admission: RE | Admit: 2016-01-07 | Discharge: 2016-01-07 | Disposition: A | Discharge status: Home / Self Care | Payer: Medicare Other | Source: Ambulatory Visit | Attending: Orthopedic Surgery | Admitting: Orthopedic Surgery

## 2016-01-07 DIAGNOSIS — Z952 Presence of prosthetic heart valve: Secondary | ICD-10-CM | POA: Diagnosis not present

## 2016-01-07 DIAGNOSIS — M67432 Ganglion, left wrist: Secondary | ICD-10-CM | POA: Diagnosis not present

## 2016-01-07 DIAGNOSIS — N189 Chronic kidney disease, unspecified: Secondary | ICD-10-CM | POA: Diagnosis not present

## 2016-01-07 DIAGNOSIS — M1812 Unilateral primary osteoarthritis of first carpometacarpal joint, left hand: Secondary | ICD-10-CM | POA: Diagnosis not present

## 2016-01-07 DIAGNOSIS — E119 Type 2 diabetes mellitus without complications: Secondary | ICD-10-CM | POA: Diagnosis not present

## 2016-01-07 DIAGNOSIS — E785 Hyperlipidemia, unspecified: Secondary | ICD-10-CM | POA: Diagnosis not present

## 2016-01-07 DIAGNOSIS — N183 Chronic kidney disease, stage 3 (moderate): Secondary | ICD-10-CM | POA: Diagnosis not present

## 2016-01-07 DIAGNOSIS — Z6841 Body Mass Index (BMI) 40.0 and over, adult: Secondary | ICD-10-CM | POA: Diagnosis not present

## 2016-01-07 DIAGNOSIS — E669 Obesity, unspecified: Secondary | ICD-10-CM | POA: Diagnosis not present

## 2016-01-07 DIAGNOSIS — E559 Vitamin D deficiency, unspecified: Secondary | ICD-10-CM | POA: Diagnosis not present

## 2016-01-07 DIAGNOSIS — I2510 Atherosclerotic heart disease of native coronary artery without angina pectoris: Secondary | ICD-10-CM | POA: Diagnosis not present

## 2016-01-07 DIAGNOSIS — M7138 Other bursal cyst, other site: Secondary | ICD-10-CM | POA: Diagnosis not present

## 2016-01-07 DIAGNOSIS — I129 Hypertensive chronic kidney disease with stage 1 through stage 4 chronic kidney disease, or unspecified chronic kidney disease: Secondary | ICD-10-CM | POA: Diagnosis not present

## 2016-01-07 DIAGNOSIS — N2581 Secondary hyperparathyroidism of renal origin: Secondary | ICD-10-CM | POA: Diagnosis not present

## 2016-01-07 LAB — BASIC METABOLIC PANEL
Anion gap: 9 (ref 5–15)
BUN: 17 mg/dL (ref 6–20)
CO2: 28 mmol/L (ref 22–32)
Calcium: 10.6 mg/dL — ABNORMAL HIGH (ref 8.9–10.3)
Chloride: 104 mmol/L (ref 101–111)
Creatinine, Ser: 0.95 mg/dL (ref 0.44–1.00)
GFR calc Af Amer: >60 mL/min (ref >60)
GFR calc non Af Amer: >60 mL/min (ref >60)
Glucose, Bld: 103 mg/dL — ABNORMAL HIGH (ref 65–99)
Potassium: 5.4 mmol/L — ABNORMAL HIGH (ref 3.5–5.1)
Sodium: 141 mmol/L (ref 135–145)

## 2016-01-07 LAB — PROTIME-INR
INR: 1.53 — ABNORMAL HIGH (ref 0.00–1.49)
Prothrombin Time: 18.5 seconds — ABNORMAL HIGH (ref 11.6–15.2)

## 2016-01-07 NOTE — Progress Notes (Signed)
Dr. Lyndle Herrlich reviewed pt's chart and EKG, will continue with surgery as scheduled

## 2016-01-08 ENCOUNTER — Encounter (HOSPITAL_BASED_OUTPATIENT_CLINIC_OR_DEPARTMENT_OTHER)
Admission: RE | Disposition: A | Discharge status: Home / Self Care | Payer: Self-pay | Source: Ambulatory Visit | Attending: Orthopedic Surgery

## 2016-01-08 ENCOUNTER — Ambulatory Visit (HOSPITAL_BASED_OUTPATIENT_CLINIC_OR_DEPARTMENT_OTHER): Payer: Medicare Other | Admitting: Anesthesiology

## 2016-01-08 ENCOUNTER — Ambulatory Visit (HOSPITAL_BASED_OUTPATIENT_CLINIC_OR_DEPARTMENT_OTHER)
Admission: RE | Admit: 2016-01-08 | Discharge: 2016-01-08 | Disposition: A | Discharge status: Home / Self Care | Payer: Medicare Other | Source: Ambulatory Visit | Attending: Orthopedic Surgery | Admitting: Orthopedic Surgery

## 2016-01-08 ENCOUNTER — Encounter (HOSPITAL_BASED_OUTPATIENT_CLINIC_OR_DEPARTMENT_OTHER): Payer: Self-pay | Admitting: Orthopedic Surgery

## 2016-01-08 DIAGNOSIS — M151 Heberden's nodes (with arthropathy): Secondary | ICD-10-CM | POA: Diagnosis not present

## 2016-01-08 DIAGNOSIS — E119 Type 2 diabetes mellitus without complications: Secondary | ICD-10-CM | POA: Insufficient documentation

## 2016-01-08 DIAGNOSIS — M67432 Ganglion, left wrist: Secondary | ICD-10-CM | POA: Diagnosis not present

## 2016-01-08 DIAGNOSIS — I129 Hypertensive chronic kidney disease with stage 1 through stage 4 chronic kidney disease, or unspecified chronic kidney disease: Secondary | ICD-10-CM | POA: Insufficient documentation

## 2016-01-08 DIAGNOSIS — M1812 Unilateral primary osteoarthritis of first carpometacarpal joint, left hand: Secondary | ICD-10-CM | POA: Insufficient documentation

## 2016-01-08 DIAGNOSIS — I10 Essential (primary) hypertension: Secondary | ICD-10-CM | POA: Diagnosis not present

## 2016-01-08 DIAGNOSIS — Z952 Presence of prosthetic heart valve: Secondary | ICD-10-CM | POA: Insufficient documentation

## 2016-01-08 DIAGNOSIS — M19042 Primary osteoarthritis, left hand: Secondary | ICD-10-CM | POA: Diagnosis not present

## 2016-01-08 DIAGNOSIS — I2510 Atherosclerotic heart disease of native coronary artery without angina pectoris: Secondary | ICD-10-CM | POA: Insufficient documentation

## 2016-01-08 DIAGNOSIS — M7138 Other bursal cyst, other site: Secondary | ICD-10-CM | POA: Diagnosis not present

## 2016-01-08 DIAGNOSIS — Z6841 Body Mass Index (BMI) 40.0 and over, adult: Secondary | ICD-10-CM | POA: Insufficient documentation

## 2016-01-08 DIAGNOSIS — N189 Chronic kidney disease, unspecified: Secondary | ICD-10-CM | POA: Insufficient documentation

## 2016-01-08 DIAGNOSIS — M71342 Other bursal cyst, left hand: Secondary | ICD-10-CM | POA: Diagnosis not present

## 2016-01-08 DIAGNOSIS — E785 Hyperlipidemia, unspecified: Secondary | ICD-10-CM | POA: Insufficient documentation

## 2016-01-08 HISTORY — PX: GANGLION CYST EXCISION: SHX1691

## 2016-01-08 HISTORY — DX: Gout, unspecified: M10.9

## 2016-01-08 HISTORY — PX: MASS EXCISION: SHX2000

## 2016-01-08 LAB — POCT I-STAT, CHEM 8
BUN: 20 mg/dL (ref 6–20)
Calcium, Ion: 1.36 mmol/L — ABNORMAL HIGH (ref 1.13–1.30)
Chloride: 103 mmol/L (ref 101–111)
Creatinine, Ser: 0.9 mg/dL (ref 0.44–1.00)
Glucose, Bld: 126 mg/dL — ABNORMAL HIGH (ref 65–99)
HCT: 50 % — ABNORMAL HIGH (ref 36.0–46.0)
Hemoglobin: 17 g/dL — ABNORMAL HIGH (ref 12.0–15.0)
Potassium: 4.4 mmol/L (ref 3.5–5.1)
Sodium: 141 mmol/L (ref 135–145)
TCO2: 25 mmol/L (ref 0–100)

## 2016-01-08 LAB — GLUCOSE, CAPILLARY: Glucose-Capillary: 134 mg/dL — ABNORMAL HIGH (ref 65–99)

## 2016-01-08 SURGERY — EXCISION, GANGLION CYST, WRIST
Anesthesia: Monitor Anesthesia Care | Site: Wrist | Laterality: Left

## 2016-01-08 MED ORDER — LACTATED RINGERS IV SOLN
INTRAVENOUS | Status: DC
  Administered 2016-01-08 (×2): via INTRAVENOUS

## 2016-01-08 MED ORDER — CHLORHEXIDINE GLUCONATE 4 % EX LIQD: 60.0000 mL | Freq: Once | CUTANEOUS | Status: DC

## 2016-01-08 MED ORDER — FENTANYL CITRATE (PF) 100 MCG/2ML IJ SOLN
25.0000 ug | INTRAMUSCULAR | Status: DC | PRN
  Administered 2016-01-08 (×2): 25 ug via INTRAVENOUS

## 2016-01-08 MED ORDER — CEFAZOLIN SODIUM-DEXTROSE 2-4 GM/100ML-% IV SOLN
INTRAVENOUS | Status: AC
  Filled 2016-01-08: qty 100

## 2016-01-08 MED ORDER — FENTANYL CITRATE (PF) 100 MCG/2ML IJ SOLN
INTRAMUSCULAR | Status: AC
  Filled 2016-01-08: qty 2

## 2016-01-08 MED ORDER — ONDANSETRON HCL 4 MG/2ML IJ SOLN
INTRAMUSCULAR | Status: DC | PRN
  Administered 2016-01-08: 4 mg via INTRAVENOUS

## 2016-01-08 MED ORDER — MIDAZOLAM HCL 2 MG/2ML IJ SOLN: 1.0000 mg | INTRAMUSCULAR | Status: DC | PRN

## 2016-01-08 MED ORDER — LIDOCAINE HCL (CARDIAC) 20 MG/ML IV SOLN
INTRAVENOUS | Status: AC
  Filled 2016-01-08: qty 5

## 2016-01-08 MED ORDER — HYDROCODONE-ACETAMINOPHEN 10-325 MG PO TABS: 1.0000 | ORAL_TABLET | Freq: Four times a day (QID) | ORAL | Status: DC | PRN

## 2016-01-08 MED ORDER — DEXTROSE 5 % IV SOLN
2.0000 g | INTRAVENOUS | Status: AC
  Administered 2016-01-08: 2 g via INTRAVENOUS

## 2016-01-08 MED ORDER — BUPIVACAINE HCL (PF) 0.25 % IJ SOLN
INTRAMUSCULAR | Status: DC | PRN
  Administered 2016-01-08: 10 mL

## 2016-01-08 MED ORDER — METOCLOPRAMIDE HCL 5 MG/ML IJ SOLN
INTRAMUSCULAR | Status: AC
  Filled 2016-01-08: qty 2

## 2016-01-08 MED ORDER — FENTANYL CITRATE (PF) 100 MCG/2ML IJ SOLN
50.0000 ug | INTRAMUSCULAR | Status: AC | PRN
  Administered 2016-01-08 (×2): 25 ug via INTRAVENOUS
  Administered 2016-01-08 (×3): 50 ug via INTRAVENOUS

## 2016-01-08 MED ORDER — METOCLOPRAMIDE HCL 5 MG/ML IJ SOLN
10.0000 mg | Freq: Once | INTRAMUSCULAR | Status: AC | PRN
  Administered 2016-01-08: 10 mg via INTRAVENOUS

## 2016-01-08 MED ORDER — ONDANSETRON HCL 4 MG/2ML IJ SOLN
INTRAMUSCULAR | Status: AC
  Filled 2016-01-08: qty 2

## 2016-01-08 MED ORDER — PROPOFOL 10 MG/ML IV BOLUS
INTRAVENOUS | Status: DC | PRN
  Administered 2016-01-08: 150 mg via INTRAVENOUS

## 2016-01-08 MED ORDER — LIDOCAINE HCL (CARDIAC) 20 MG/ML IV SOLN
INTRAVENOUS | Status: DC | PRN
  Administered 2016-01-08: 50 mg via INTRAVENOUS

## 2016-01-08 MED ORDER — MEPERIDINE HCL 25 MG/ML IJ SOLN: 6.2500 mg | INTRAMUSCULAR | Status: DC | PRN

## 2016-01-08 MED ORDER — SCOPOLAMINE 1 MG/3DAYS TD PT72: 1.0000 | MEDICATED_PATCH | Freq: Once | TRANSDERMAL | Status: DC | PRN

## 2016-01-08 MED ORDER — GLYCOPYRROLATE 0.2 MG/ML IJ SOLN: 0.2000 mg | Freq: Once | INTRAMUSCULAR | Status: DC | PRN

## 2016-01-08 MED ORDER — BUPIVACAINE HCL (PF) 0.25 % IJ SOLN
INTRAMUSCULAR | Status: AC
  Filled 2016-01-08: qty 60

## 2016-01-08 MED ORDER — PROPOFOL 500 MG/50ML IV EMUL
INTRAVENOUS | Status: AC
  Filled 2016-01-08: qty 50

## 2016-01-08 SURGICAL SUPPLY — 47 items
BANDAGE COBAN STERILE 2 (GAUZE/BANDAGES/DRESSINGS) IMPLANT
BLADE SURG 15 STRL LF DISP TIS (BLADE) ×2 IMPLANT
BLADE SURG 15 STRL SS (BLADE) ×3
BNDG CMPR 9X4 STRL LF SNTH (GAUZE/BANDAGES/DRESSINGS) ×2
BNDG COHESIVE 1X5 TAN STRL LF (GAUZE/BANDAGES/DRESSINGS) ×3 IMPLANT
BNDG COHESIVE 3X5 TAN STRL LF (GAUZE/BANDAGES/DRESSINGS) ×3 IMPLANT
BNDG ESMARK 4X9 LF (GAUZE/BANDAGES/DRESSINGS) ×3 IMPLANT
BNDG GAUZE ELAST 4 BULKY (GAUZE/BANDAGES/DRESSINGS) ×3 IMPLANT
CHLORAPREP W/TINT 26ML (MISCELLANEOUS) ×3 IMPLANT
CORDS BIPOLAR (ELECTRODE) ×3 IMPLANT
COVER BACK TABLE 60X90IN (DRAPES) ×3 IMPLANT
COVER MAYO STAND STRL (DRAPES) ×3 IMPLANT
CUFF TOURNIQUET SINGLE 18IN (TOURNIQUET CUFF) IMPLANT
CUFF TOURNIQUET SINGLE 24IN (TOURNIQUET CUFF) ×3 IMPLANT
DECANTER SPIKE VIAL GLASS SM (MISCELLANEOUS) IMPLANT
DRAIN PENROSE 1/2X12 LTX STRL (WOUND CARE) IMPLANT
DRAPE EXTREMITY T 121X128X90 (DRAPE) ×3 IMPLANT
DRAPE SURG 17X23 STRL (DRAPES) ×3 IMPLANT
GAUZE SPONGE 4X4 12PLY STRL (GAUZE/BANDAGES/DRESSINGS) ×3 IMPLANT
GAUZE XEROFORM 1X8 LF (GAUZE/BANDAGES/DRESSINGS) ×3 IMPLANT
GLOVE BIOGEL PI IND STRL 7.0 (GLOVE) ×4 IMPLANT
GLOVE BIOGEL PI IND STRL 8.5 (GLOVE) ×2 IMPLANT
GLOVE BIOGEL PI INDICATOR 7.0 (GLOVE) ×2
GLOVE BIOGEL PI INDICATOR 8.5 (GLOVE) ×1
GLOVE ECLIPSE 6.5 STRL STRAW (GLOVE) ×3 IMPLANT
GLOVE SURG ORTHO 8.0 STRL STRW (GLOVE) ×3 IMPLANT
GOWN STRL REUS W/ TWL LRG LVL3 (GOWN DISPOSABLE) ×2 IMPLANT
GOWN STRL REUS W/TWL LRG LVL3 (GOWN DISPOSABLE) ×3
GOWN STRL REUS W/TWL XL LVL3 (GOWN DISPOSABLE) ×3 IMPLANT
NDL SAFETY ECLIPSE 18X1.5 (NEEDLE) IMPLANT
NEEDLE HYPO 18GX1.5 SHARP (NEEDLE)
NEEDLE PRECISIONGLIDE 27X1.5 (NEEDLE) ×3 IMPLANT
NS IRRIG 1000ML POUR BTL (IV SOLUTION) ×3 IMPLANT
PACK BASIN DAY SURGERY FS (CUSTOM PROCEDURE TRAY) ×3 IMPLANT
PAD CAST 3X4 CTTN HI CHSV (CAST SUPPLIES) ×2 IMPLANT
PADDING CAST COTTON 3X4 STRL (CAST SUPPLIES) ×3
SLEEVE SCD COMPRESS KNEE MED (MISCELLANEOUS) ×3 IMPLANT
SPLINT FINGER 3.25 BULB 911905 (SOFTGOODS) ×3 IMPLANT
SPLINT PLASTER CAST XFAST 3X15 (CAST SUPPLIES) ×10 IMPLANT
SPLINT PLASTER XTRA FASTSET 3X (CAST SUPPLIES) ×5
STOCKINETTE 4X48 STRL (DRAPES) ×3 IMPLANT
SUT ETHILON 4 0 PS 2 18 (SUTURE) ×3 IMPLANT
SUT VIC AB 4-0 P2 18 (SUTURE) ×3 IMPLANT
SYR BULB 3OZ (MISCELLANEOUS) ×3 IMPLANT
SYR CONTROL 10ML LL (SYRINGE) ×3 IMPLANT
TOWEL OR 17X24 6PK STRL BLUE (TOWEL DISPOSABLE) ×3 IMPLANT
UNDERPAD 30X30 (UNDERPADS AND DIAPERS) IMPLANT

## 2016-01-08 NOTE — Anesthesia Postprocedure Evaluation (Signed)
Anesthesia Post Note  Patient: [name]  Procedure(s) Performed: Procedure(s) (LRB): LEFT WRIST EXCISION OF VOLAR CYST, EXCISION OF MUCOID TUMOR (Left) EXCISION MUCOID TUMOR LEFT INDEX FINGER DISTAL INTERPHALANGEAL JOINT (Left)  Patient location during evaluation: PACU Anesthesia Type: General Level of consciousness: awake and alert Pain management: pain level controlled Vital Signs Assessment: post-procedure vital signs reviewed and stable Respiratory status: spontaneous breathing, nonlabored ventilation, respiratory function stable and patient connected to nasal cannula oxygen Cardiovascular status: blood pressure returned to baseline and stable Postop Assessment: no signs of nausea or vomiting Anesthetic complications: no    Last Vitals:  Filed Vitals:   01/08/16 1045 01/08/16 1115  BP: 147/66 159/73  Pulse: 84 73  Temp:  36.7 C  Resp: 13 18    Last Pain:  Filed Vitals:   01/08/16 1116  PainSc: 2                  Montez Hageman

## 2016-01-08 NOTE — Anesthesia Procedure Notes (Signed)
Procedure Name: LMA Insertion Date/Time: 01/08/2016 8:39 AM Performed by: Lieutenant Diego Pre-anesthesia Checklist: Patient identified, Emergency Drugs available, Suction available and Patient being monitored Patient Re-evaluated:Patient Re-evaluated prior to inductionOxygen Delivery Method: Circle System Utilized Preoxygenation: Pre-oxygenation with 100% oxygen Intubation Type: IV induction Ventilation: Mask ventilation without difficulty LMA: LMA inserted LMA Size: 4.0 Number of attempts: 1 Airway Equipment and Method: Bite block Placement Confirmation: positive ETCO2 and breath sounds checked- equal and bilateral Tube secured with: Tape Dental Injury: Teeth and Oropharynx as per pre-operative assessment

## 2016-01-08 NOTE — Op Note (Signed)
Dictation Number [id number]

## 2016-01-08 NOTE — H&P (Signed)
[name] is an 65 y.o. female.   Chief Complaint: mass left wrist and left index finger HPI: [name] is a 65 year old right hand dominant female who comes in for mass on the radial aspect of her left wrist palmar side which has been present for 30 years. This became inflamed and enlarge in December. She recalls no injury.. She also has a mass on her index finger distal interphalangeal joint, which has been present for months. She has a VAS score 8/10 esp with palmar flexion and describes it as an ache. She complains of pain with grip and pinch.She recalls no history of injury to the hand or to the neck. She does have a history of diabetes. No history of thyroid problems, no history of arthritis, history of gout. There is family history of diabetes, arthritis, psoriatic in her mother and gout. She is unable to take nonsteroidal anti-inflammatories because she is taking coumadin for a valve replacement. She is also taking hydrocodone and Colcrys for arthritis. She states that generally it does not cause significant pain for her. Unfortunately, she did not tell me of the mass on the index finger until she was ready to leave after x-rays have been done of her left wrist. She has had her ultrasound done revealing a large cyst approximately 2.63 cm in length by 1.19 x 0.8 cm in width and depth this is right next to her radial artery.   Past Medical History  Diagnosis Date  . Diabetes mellitus type II, controlled ([place])  . Hypertension   Past Surgical History  Procedure Laterality Date  . Cardiac surgery  . Elbow surgery  . Back surgery    Past Medical History  Diagnosis Date  . Diabetes mellitus ([place])   . Hyperlipidemia   . AS (aortic stenosis) 09/18/2012    2D Echo EF 55%-60%  . AI (aortic insufficiency)   . Tricuspid regurgitation   . Cough   . Arthritis   . Obese   . Hypertension     dr Donetta Potts  . Chronic kidney disease     renal insuffcentily  . Renal insufficiency    . Coronary artery disease   . Gout     right foot  . PONV (postoperative nausea and vomiting)     with early surgeries , none recent    Past Surgical History  Procedure Laterality Date  . Aortic valve replacement  04/01/2010    21-mm Sr Jude Regent ;mechanical valve   . Replacement  of ascending aortic aneurysm  04/01/2010    28-mm supracoronary Hemashield tube graft using deep hypothermic circulatory arrest  . Cardiac catheterization  02/09/2010  . Broken arm      wire in lt elbow  . Lumbar laminectomy/decompression microdiscectomy N/A 02/28/2013    Procedure: LUMBAR LAMINECTOMY/DECOMPRESSION MICRODISCECTOMY 2 LEVELS;  Surgeon: Ophelia Charter, MD;  Location: [place] [facility];  Service: Neurosurgery;  Laterality: N/A;  Lumbar three-four and Lumbar four-five laminectomies  . Back surgery    . Fracture surgery      Family History  Problem Relation Age of Onset  . CAD    . Cancer - Prostate Father   . Cancer - Lung Maternal Grandfather   . Diabetes    . Hypertension     Social History:  reports that she has never smoked. She has never used smokeless tobacco. She reports that she does not drink alcohol or use illicit drugs.  Allergies:  Allergies  Allergen Reactions  . Morphine And  Related Other (See Comments)    BP drops    No prescriptions prior to admission    Results for orders placed or performed during the hospital encounter of 01/08/16 (from the past 48 hour(s))  Basic metabolic panel     Status: Abnormal   Collection Time: 01/07/16  9:00 AM  Result Value Ref Range   Sodium 141 135 - 145 mmol/L   Potassium 5.4 (H) 3.5 - 5.1 mmol/L   Chloride 104 101 - 111 mmol/L   CO2 28 22 - 32 mmol/L   Glucose, Bld 103 (H) 65 - 99 mg/dL   BUN 17 6 - 20 mg/dL   Creatinine, Ser 0.95 0.44 - 1.00 mg/dL   Calcium 10.6 (H) 8.9 - 10.3 mg/dL   GFR calc non Af Amer >60 >60 mL/min   GFR calc Af Amer >60 >60 mL/min    Comment: (NOTE) The eGFR has been calculated using the CKD EPI  equation. This calculation has not been validated in all clinical situations. eGFR's persistently <60 mL/min signify possible Chronic Kidney Disease.    Anion gap 9 5 - 15  PT/INR at PAT visit (Pre-admission Testing)     Status: Abnormal   Collection Time: 01/07/16  9:00 AM  Result Value Ref Range   Prothrombin Time 18.5 (H) 11.6 - 15.2 seconds   INR 1.53 (H) 0.00 - 1.49    No results found.   Pertinent items are noted in HPI.  Height _0  (1.575 m), weight 121.11 kg (267 lb).  General appearance: alert, cooperative and appears stated age Head: Normocephalic, without obvious abnormality Neck: no JVD Resp: clear to auscultation bilaterally Cardio: regular rate and rhythm, S1, S2 normal, no murmur, click, rub or gallop GI: soft, non-tender; bowel sounds normal; no masses,  no organomegaly Extremities: masses LIF and left wrist Pulses: 2+ and symmetric Skin: Skin color, texture, turgor normal. No rashes or lesions Neurologic: Grossly normal Incision/Wound: na  Assessment/Plan 1. Ganglion of left wrist  2. Primary osteoarthritis of first carpometacarpal joint of left hand  Mucoid tumor left index finger  PLAN: She would like to have this excised pre and postoperative course has been discussed along with risks and complications. She is aware there is no guarantee with the surgery, possibility of infection, recurrence, injury to arteries, nerves, tendons, incomplete relief of symptoms, dystrophy. She is presently on Coumadin. States that she cannot be removed from this. She is taking Lovenox in the past. [facility] takes care of this. She will check with them to have this coordinated. She is scheduled as an outpatient for excision of a volar radial wrist ganglion, left wrist under upper arm IV regional anesthesia. And excision cyst and debridement DIP mucoid tumor on the distal interphalangeal joint, left index finger. This is grooving the nail.  X-rays do reveal degenerative  change at the distal interphalangeal joint grooving of the nail indicative of mucoid tumor. She is advised that this can be. She is scheduled for excision of mucoid cyst debridement of distal metatarsophalangeal joint, left index finger at the same time of excision of the volar radial wrist ganglion.   Jesaiah Fabiano R 01/08/2016, 5:42 AM

## 2016-01-08 NOTE — Transfer of Care (Signed)
Immediate Anesthesia Transfer of Care Note  Patient: Russella Dar  Procedure(s) Performed: Procedure(s): LEFT WRIST EXCISION OF VOLAR CYST, EXCISION OF MUCOID TUMOR (Left) EXCISION MUCOID TUMOR LEFT INDEX FINGER DISTAL INTERPHALANGEAL JOINT (Left)  Patient Location: PACU  Anesthesia Type:General  Level of Consciousness: sedated  Airway & Oxygen Therapy: Patient Spontanous Breathing and Patient connected to face mask oxygen  Post-op Assessment: Report given to RN and Post -op Vital signs reviewed and stable  Post vital signs: Reviewed and stable  Last Vitals:  Filed Vitals:   01/08/16 0709  BP: 177/63  Pulse: 72  Temp: 36.4 C  Resp: 20    Complications: No apparent anesthesia complications

## 2016-01-08 NOTE — Op Note (Signed)
NAME:  [name], [name] NO.:  [id number]  MEDICAL RECORD NO.:  [id number]  PLACE OF SURGERY:  [facility].                                  PHYSICIAN:  Daryll Brod, M.D.       DATE OF BIRTH:  1951/07/08  DATE OF PROCEDURE:  01/08/2016 DATE OF DISCHARGE:                              OPERATIVE REPORT   PREOPERATIVE DIAGNOSIS:  Volar radial wrist ganglion, left wrist with mucoid tumor, left index finger distal interphalangeal joint.  POSTOPERATIVE DIAGNOSES:  Volar radial wrist ganglion, left wrist with mucoid tumor, left index finger distal interphalangeal joint.  OPERATIONS:  Excision of volar radial wrist ganglion.  Excision of mucoid tumor, left index finger with debridement of distal interphalangeal joint, left index finger.  SURGEON:  Daryll Brod, M.D.  ANESTHESIA:  General with local infiltration.  HISTORY:  The patient is a 65 year old female with a history of a mass over the DIP joint of her left index finger and a mass on the volar radial aspect of her left wrist.  She is desirous having each of these excised.  X-rays revealed degenerative changes of the distal interphalangeal joint of the index finger.  Pre, peri and postoperative course have been discussed along with risks and complications.  She is aware that there is no guarantee with the surgery possibility of infection; recurrence of injury to arteries, nerves, tendons; incomplete relief of symptoms; and dystrophy the possibility of recurrence and the each of the cyst.  In the preoperative area, the patient was seen.  The extremity marked by both the patient and surgeon.  Antibiotic given.  PROCEDURE IN DETAIL:  The patient was brought to the operating room, where a general anesthetic was carried out without difficulty.  She was prepped using ChloraPrep, supine position with the left arm free.  A 3- minute dry time was allowed.  Time-out was taken confirming the patient and procedure.   The limb was exsanguinated with an Esmarch bandage. Tourniquet placed on the upper arm was inflated to 250 mmHg.  The volar radial wrist ganglion was approached first.  A longitudinal incision was made directly over the volar aspect of the left wrist carried down through subcutaneous tissue.  Bleeders were electrocauterized.  A large cyst measuring approximately 3-4 cm in length was identified.  The radial artery was identified.  Bleeders were electrocauterized with bipolar.  The radial artery was protected.  The dissection was carried distally and the stalk followed down into the radiocarpal joint.  This area was opened up.  The stalk and cyst were sent to Pathology.  The area in the joint was then opened, irrigated and sutured over with figure-of-eight 4-0 Vicryl sutures.  Subcutaneous tissue was closed with interrupted 4-0 Vicryl and the skin with interrupted 4-0 nylon sutures. Meticulous hemostasis was afforded with bipolar there to be on Lovenox. A separate incision was then made over the distal interphalangeal joint of the left index finger carried down through subcutaneous tissue. Bleeders again electrocauterized bipolar.  The dissection carried down beneath the skin to the cyst.  This was removed on its volar aspect leaving dorsal aspect and skin and packed to provide a possible circulation to  the translucent skin.  The cyst was excised and sent to Pathology.  This was done with a small hemostatic rongeur and House curette.  The joint was then opened.  The distal interphalangeal joint, was then cleared of osteophytes and a dorsal synovectomy performed with the hemostatic rongeur.  The specimen was sent to Pathology.  The wound was copiously irrigated with saline.  The skin closed with interrupted 4- 0 nylon sutures.  A metacarpal block was then given 0.25% bupivacaine without epinephrine along with the local infiltration.  The incision for the volar radial wrist ganglion, total of  10 mL was used.  A sterile compressive dressing with the splint and wrist was applied along with a splint to the wrist and finger.  The patient tolerated the procedure well.  On deflation of the tourniquet, all fingers immediately pinked. She was taken to the recovery room for observation in satisfactory condition.  She will be discharged home to return the [place] in 1 week, on Norco.          ______________________________ Daryll Brod, M.D.     GK/MEDQ  D:  01/08/2016  T:  01/08/2016  Job:  [id number]

## 2016-01-08 NOTE — Discharge Instructions (Addendum)
[facility] Instructions °Hand Surgery ° °Wound Care: °Keep your hand elevated above the level of your heart.  Do not allow it to dangle by your side.  Keep the dressing dry and do not remove it unless your doctor advises you to do so.  He will usually change it at the time of your post-op visit.  Moving your fingers is advised to stimulate circulation but will depend on the site of your surgery.  If you have a splint applied, your doctor will advise you regarding movement. ° °Activity: °Do not drive or operate machinery today.  Rest today and then you may return to your normal activity and work as indicated by your physician. ° °Diet:  °Drink liquids today or eat a light diet.  You may resume a regular diet tomorrow.   ° °General expectations: °Pain for two to three days. °Fingers may become slightly swollen. ° °Call your doctor if any of the following occur: °Severe pain not relieved by pain medication. °Elevated temperature. °Dressing soaked with blood. °Inability to move fingers. °White or bluish color to fingers. ° ° ° °Post Anesthesia Home Care Instructions ° °Activity: °Get plenty of rest for the remainder of the day. A responsible adult should stay with you for 24 hours following the procedure.  °For the next 24 hours, DO NOT: °-Drive a car °-Operate machinery °-Drink alcoholic beverages °-Take any medication unless instructed by your physician °-Make any legal decisions or sign important papers. ° °Meals: °Start with liquid foods such as gelatin or soup. Progress to regular foods as tolerated. Avoid greasy, spicy, heavy foods. If nausea and/or vomiting occur, drink only clear liquids until the nausea and/or vomiting subsides. Call your physician if vomiting continues. ° °Special Instructions/Symptoms: °Your throat may feel dry or sore from the anesthesia or the breathing tube placed in your throat during surgery. If this causes discomfort, gargle with warm salt water. The discomfort should disappear within  24 hours. ° °If you had a scopolamine patch placed behind your ear for the management of post- operative nausea and/or vomiting: ° °1. The medication in the patch is effective for 72 hours, after which it should be removed.  Wrap patch in a tissue and discard in the trash. Wash hands thoroughly with soap and water. °2. You may remove the patch earlier than 72 hours if you experience unpleasant side effects which may include dry mouth, dizziness or visual disturbances. °3. Avoid touching the patch. Wash your hands with soap and water after contact with the patch. °  °Call your surgeon if you experience:  ° °1.  Fever over 101.0. °2.  Inability to urinate. °3.  Nausea and/or vomiting. °4.  Extreme swelling or bruising at the surgical site. °5.  Continued bleeding from the incision. °6.  Increased pain, redness or drainage from the incision. °7.  Problems related to your pain medication. °8.  Any problems and/or concerns °

## 2016-01-08 NOTE — Anesthesia Preprocedure Evaluation (Addendum)
Anesthesia Evaluation  Patient identified by MRN, date of birth, ID band Patient awake    Reviewed: Allergy & Precautions, NPO status , Patient's Chart, lab work & pertinent test results  History of Anesthesia Complications (+) PONV  Airway Mallampati: II  TM Distance: >3 FB Neck ROM: Full    Dental no notable dental hx.    Pulmonary neg pulmonary ROS,    Pulmonary exam normal breath sounds clear to auscultation       Cardiovascular hypertension, Pt. on medications + CAD  Normal cardiovascular exam+ Valvular Problems/Murmurs (s/p AVR)  Rhythm:Regular Rate:Normal     Neuro/Psych negative neurological ROS  negative psych ROS   GI/Hepatic negative GI ROS, Neg liver ROS,   Endo/Other  diabetesMorbid obesity  Renal/GU negative Renal ROS  negative genitourinary   Musculoskeletal negative musculoskeletal ROS (+)   Abdominal   Peds negative pediatric ROS (+)  Hematology negative hematology ROS (+)   Anesthesia Other Findings   Reproductive/Obstetrics negative OB ROS                            Anesthesia Physical Anesthesia Plan  ASA: III  Anesthesia Plan: MAC and Bier Block   Post-op Pain Management:    Induction:   Airway Management Planned: Simple Face Mask  Additional Equipment:   Intra-op Plan:   Post-operative Plan:   Informed Consent: I have reviewed the patients History and Physical, chart, labs and discussed the procedure including the risks, benefits and alternatives for the proposed anesthesia with the patient or authorized representative who has indicated his/her understanding and acceptance.   Dental advisory given  Plan Discussed with: CRNA  Anesthesia Plan Comments:         Anesthesia Quick Evaluation

## 2016-01-08 NOTE — Brief Op Note (Signed)
01/08/2016  9:38 AM  PATIENT:  [name]  65 y.o. female  PRE-OPERATIVE DIAGNOSIS:  volar cyst left wrist mucoid tumor Left Index Finger Degenerative Joint Disease  Distal Interphalangeal M67.432  M15.1  POST-OPERATIVE DIAGNOSIS:  volar cyst left wrist mucoid tumor Left Index Finger Degenerative Joint Disease  Distal Interphalangeal M67.432  M15.1  PROCEDURE:  Procedure(s): LEFT WRIST EXCISION OF VOLAR CYST, EXCISION OF MUCOID TUMOR (Left) EXCISION MUCOID TUMOR LEFT INDEX FINGER DISTAL INTERPHALANGEAL JOINT (Left)  SURGEON:  Surgeon(s) and Role:    * Daryll Brod, MD - Primary  PHYSICIAN ASSISTANT:   ASSISTANTS: none   ANESTHESIA:   local and general  EBL:  Total I/O In: 300 [I.V.:300] Out: 25 [Blood:25]  BLOOD ADMINISTERED:none  DRAINS: none   LOCAL MEDICATIONS USED:  BUPIVICAINE   SPECIMEN:  Excision  DISPOSITION OF SPECIMEN:  PATHOLOGY  COUNTS:  YES  TOURNIQUET:   Total Tourniquet Time Documented: Upper Arm (Left) - 36 minutes Total: Upper Arm (Left) - 36 minutes   DICTATION: .Other Dictation: Dictation Number [id number]  PLAN OF CARE: Discharge to home after PACU  PATIENT DISPOSITION:  PACU - hemodynamically stable.

## 2016-01-09 ENCOUNTER — Encounter (HOSPITAL_BASED_OUTPATIENT_CLINIC_OR_DEPARTMENT_OTHER): Payer: Self-pay | Admitting: Orthopedic Surgery

## 2016-01-12 ENCOUNTER — Encounter (HOSPITAL_COMMUNITY): Payer: Self-pay | Admitting: *Deleted

## 2016-01-12 ENCOUNTER — Emergency Department (HOSPITAL_COMMUNITY): Payer: Medicare Other

## 2016-01-12 ENCOUNTER — Telehealth: Payer: Self-pay | Admitting: *Deleted

## 2016-01-12 ENCOUNTER — Emergency Department (HOSPITAL_COMMUNITY)
Admission: EM | Admit: 2016-01-12 | Discharge: 2016-01-12 | Disposition: A | Discharge status: Home / Self Care | Payer: Medicare Other | Attending: Emergency Medicine | Admitting: Emergency Medicine

## 2016-01-12 DIAGNOSIS — I2510 Atherosclerotic heart disease of native coronary artery without angina pectoris: Secondary | ICD-10-CM | POA: Insufficient documentation

## 2016-01-12 DIAGNOSIS — E785 Hyperlipidemia, unspecified: Secondary | ICD-10-CM | POA: Insufficient documentation

## 2016-01-12 DIAGNOSIS — I129 Hypertensive chronic kidney disease with stage 1 through stage 4 chronic kidney disease, or unspecified chronic kidney disease: Secondary | ICD-10-CM | POA: Insufficient documentation

## 2016-01-12 DIAGNOSIS — E1122 Type 2 diabetes mellitus with diabetic chronic kidney disease: Secondary | ICD-10-CM | POA: Diagnosis not present

## 2016-01-12 DIAGNOSIS — R279 Unspecified lack of coordination: Secondary | ICD-10-CM | POA: Diagnosis not present

## 2016-01-12 DIAGNOSIS — R52 Pain, unspecified: Secondary | ICD-10-CM | POA: Diagnosis not present

## 2016-01-12 DIAGNOSIS — M79671 Pain in right foot: Secondary | ICD-10-CM | POA: Diagnosis not present

## 2016-01-12 DIAGNOSIS — M7989 Other specified soft tissue disorders: Secondary | ICD-10-CM | POA: Diagnosis not present

## 2016-01-12 DIAGNOSIS — Z79899 Other long term (current) drug therapy: Secondary | ICD-10-CM | POA: Diagnosis not present

## 2016-01-12 DIAGNOSIS — N189 Chronic kidney disease, unspecified: Secondary | ICD-10-CM | POA: Insufficient documentation

## 2016-01-12 DIAGNOSIS — E669 Obesity, unspecified: Secondary | ICD-10-CM | POA: Insufficient documentation

## 2016-01-12 DIAGNOSIS — Z743 Need for continuous supervision: Secondary | ICD-10-CM | POA: Diagnosis not present

## 2016-01-12 DIAGNOSIS — M25561 Pain in right knee: Secondary | ICD-10-CM | POA: Diagnosis not present

## 2016-01-12 DIAGNOSIS — Z7984 Long term (current) use of oral hypoglycemic drugs: Secondary | ICD-10-CM | POA: Diagnosis not present

## 2016-01-12 DIAGNOSIS — M25461 Effusion, right knee: Secondary | ICD-10-CM | POA: Diagnosis not present

## 2016-01-12 DIAGNOSIS — M109 Gout, unspecified: Secondary | ICD-10-CM | POA: Insufficient documentation

## 2016-01-12 DIAGNOSIS — Z7901 Long term (current) use of anticoagulants: Secondary | ICD-10-CM | POA: Diagnosis not present

## 2016-01-12 DIAGNOSIS — R229 Localized swelling, mass and lump, unspecified: Secondary | ICD-10-CM | POA: Diagnosis not present

## 2016-01-12 LAB — PROTIME-INR
INR: 1.84 — ABNORMAL HIGH (ref 0.00–1.49)
Prothrombin Time: 21.2 seconds — ABNORMAL HIGH (ref 11.6–15.2)

## 2016-01-12 MED ORDER — PREDNISONE 10 MG PO TABS: 40.0000 mg | ORAL_TABLET | Freq: Every day | ORAL | Status: DC

## 2016-01-12 MED ORDER — PREDNISONE 50 MG PO TABS
60.0000 mg | ORAL_TABLET | Freq: Once | ORAL | Status: AC
  Administered 2016-01-12: 60 mg via ORAL
  Filled 2016-01-12: qty 1

## 2016-01-12 MED ORDER — HYDROMORPHONE HCL 4 MG PO TABS: 4.0000 mg | ORAL_TABLET | Freq: Four times a day (QID) | ORAL | Status: DC | PRN

## 2016-01-12 NOTE — ED Notes (Signed)
Pt's cbg 169 checked by [facility]

## 2016-01-12 NOTE — ED Notes (Signed)
Called EMS for transport back home as requested by her nurse at the request of the PT.

## 2016-01-12 NOTE — ED Notes (Addendum)
Dr. Zackowski at bedside  

## 2016-01-12 NOTE — Discharge Instructions (Signed)
Suspect left foot redness and swelling is related to gout. Also possible the right knee pain could be related to gout. Prednisone for the inflammation. Supplement the hydrocodone that you have at home with the oral Dilaudid for pain. INR level checked for you today still little below 2. Return for any new or worse symptoms. X-rays without any acute findings other than the right knee showing evidence of arthritis.

## 2016-01-12 NOTE — ED Provider Notes (Signed)
CSN: [id number]     Arrival date & time 01/12/16  [id number] History   First MD Initiated Contact with Patient 01/12/16 [phone]     Chief Complaint  Patient presents with  . Knee Pain     (Consider location/radiation/quality/duration/timing/severity/associated sxs/prior Treatment) Patient is a 65 y.o. female presenting with knee pain. The history is provided by the patient.  Knee Pain Associated symptoms: no back pain, no fever and no neck pain    patient with complaint of right knee pain started a day ago. And the left foot pain with redness and swelling that started around the same time. Patient status post carpal tunnel surgery to her left wrist. That arm is in a splint. Patient states she is unable to ambulate because of the extremity pain. Patient takes hydrocodone 10 mg at home on a regular basis states is not helping. Patient has a history of gout.  Patient is on Coumadin was switched over to Lovenox due to recent surgery and is now building her Coumadin level back up. She was post an appointment for that later today.  Past Medical History  Diagnosis Date  . Diabetes mellitus ([place])   . Hyperlipidemia   . AS (aortic stenosis) 09/18/2012    2D Echo EF 55%-60%  . AI (aortic insufficiency)   . Tricuspid regurgitation   . Cough   . Arthritis   . Obese   . Hypertension     dr Donetta Potts  . Chronic kidney disease     renal insuffcentily  . Renal insufficiency   . Coronary artery disease   . Gout     right foot  . PONV (postoperative nausea and vomiting)     with early surgeries , none recent   Past Surgical History  Procedure Laterality Date  . Aortic valve replacement  04/01/2010    21-mm Sr Jude Regent ;mechanical valve   . Replacement  of ascending aortic aneurysm  04/01/2010    28-mm supracoronary Hemashield tube graft using deep hypothermic circulatory arrest  . Cardiac catheterization  02/09/2010  . Broken arm      wire in lt elbow  . Lumbar laminectomy/decompression  microdiscectomy N/A 02/28/2013    Procedure: LUMBAR LAMINECTOMY/DECOMPRESSION MICRODISCECTOMY 2 LEVELS;  Surgeon: Ophelia Charter, MD;  Location: [place] [facility];  Service: Neurosurgery;  Laterality: N/A;  Lumbar three-four and Lumbar four-five laminectomies  . Back surgery    . Fracture surgery    . Ganglion cyst excision Left 01/08/2016    Procedure: LEFT WRIST EXCISION OF VOLAR CYST, EXCISION OF MUCOID TUMOR;  Surgeon: Daryll Brod, MD;  Location: [facility];  Service: Orthopedics;  Laterality: Left;  Marland Kitchen Mass excision Left 01/08/2016    Procedure: EXCISION MUCOID TUMOR LEFT INDEX FINGER DISTAL INTERPHALANGEAL JOINT;  Surgeon: Daryll Brod, MD;  Location: [facility];  Service: Orthopedics;  Laterality: Left;   Family History  Problem Relation Age of Onset  . CAD    . Cancer - Prostate Father   . Cancer - Lung Maternal Grandfather   . Diabetes    . Hypertension     Social History  Substance Use Topics  . Smoking status: Never Smoker   . Smokeless tobacco: Never Used  . Alcohol Use: No   OB History    No data available     Review of Systems  Constitutional: Negative for fever.  HENT: Negative for congestion.   Eyes: Negative for visual disturbance.  Respiratory: Negative for shortness of breath.  Cardiovascular: Positive for leg swelling. Negative for chest pain.  Gastrointestinal: Negative for abdominal pain.  Genitourinary: Negative for dysuria.  Musculoskeletal: Positive for arthralgias. Negative for back pain and neck pain.  Skin: Negative for rash.  Neurological: Negative for headaches.  Hematological: Does not bruise/bleed easily.  Psychiatric/Behavioral: Negative for confusion.      Allergies  Morphine and related  Home Medications   Prior to Admission medications   Medication Sig Start Date End Date Taking? Authorizing Provider  allopurinol (ZYLOPRIM) 100 MG tablet Take 100 mg by mouth daily.   Yes Historical Provider, MD  ALPRAZolam  Duanne Moron) 0.5 MG tablet Take 0.5 mg by mouth at bedtime as needed for sleep.    Yes Historical Provider, MD  b complex vitamins tablet Take 1 tablet by mouth daily.   Yes Historical Provider, MD  enoxaparin (LOVENOX) 120 MG/0.8ML injection Inject 120 mg into the skin every 12 (twelve) hours.   Yes Historical Provider, MD  ferrous sulfate 325 (65 FE) MG tablet Take 325 mg by mouth 2 (two) times daily.   Yes Historical Provider, MD  furosemide (LASIX) 20 MG tablet TAKE ONE TABLET BY MOUTH ONCE DAILY 09/01/15  Yes Mihai Croitoru, MD  glimepiride (AMARYL) 2 MG tablet Take 2 mg by mouth daily before breakfast.   Yes Historical Provider, MD  HYDROcodone-acetaminophen (NORCO) 10-325 MG tablet Take 1 tablet by mouth every 6 (six) hours as needed. Patient taking differently: Take 1 tablet by mouth every 6 (six) hours as needed for moderate pain.  01/08/16  Yes Daryll Brod, MD  lisinopril (PRINIVIL,ZESTRIL) 10 MG tablet Take 10 mg by mouth daily.   Yes Historical Provider, MD  metoprolol (LOPRESSOR) 50 MG tablet TAKE ONE-HALF TABLET BY MOUTH TWICE DAILY 09/30/15  Yes Mihai Croitoru, MD  Multiple Vitamin (MULTIVITAMIN WITH MINERALS) TABS Take 1 tablet by mouth daily.   Yes Historical Provider, MD  multivitamin-lutein (OCUVITE-LUTEIN) CAPS Take 1 capsule by mouth daily.   Yes Historical Provider, MD  pravastatin (PRAVACHOL) 40 MG tablet Take 40 mg by mouth daily.   Yes Historical Provider, MD  zolpidem (AMBIEN) 10 MG tablet Take 10 mg by mouth at bedtime.    Yes Historical Provider, MD  bisacodyl (DULCOLAX) 5 MG EC tablet Take 5 mg by mouth daily as needed for moderate constipation.    Historical Provider, MD  HYDROmorphone (DILAUDID) 4 MG tablet Take 1 tablet (4 mg total) by mouth every 6 (six) hours as needed for severe pain. 01/12/16   Fredia Sorrow, MD  Omega-3 Fatty Acids (FISH OIL) 1000 MG CAPS Take 1,000 mg by mouth daily.    Historical Provider, MD  oxyCODONE-acetaminophen (PERCOCET/ROXICET) 5-325 MG tablet  Take 1 tablet by mouth every 4 (four) hours as needed. Patient not taking: Reported on 01/12/2016 12/07/15   Evalee Jefferson, PA-C  predniSONE (DELTASONE) 10 MG tablet Take 4 tablets (40 mg total) by mouth daily. 01/12/16   Fredia Sorrow, MD  warfarin (COUMADIN) 5 MG tablet TAKE ONE TABLET BY MOUTH ONCE DAILY OR AS DIRECTED 10/29/15   Mihai Croitoru, MD   BP 98/44 mmHg  Pulse 83  Temp(Src) 98.3 F (36.8 C) (Oral)  Resp 16  Ht 5\' 2"  (1.575 m)  Wt 114.76 kg  BMI 46.26 kg/m2  SpO2 97% Physical Exam  Constitutional: She is oriented to person, place, and time. She appears well-developed and well-nourished. No distress.  HENT:  Head: Normocephalic and atraumatic.  Mouth/Throat: Oropharynx is clear and moist.  Eyes: Conjunctivae and EOM are normal.  Pupils are equal, round, and reactive to light.  Neck: Normal range of motion. Neck supple.  Cardiovascular: Normal rate, regular rhythm and normal heart sounds.   No murmur heard. Pulmonary/Chest: Effort normal and breath sounds normal. No respiratory distress.  Abdominal: Soft. Bowel sounds are normal. There is no tenderness.  Musculoskeletal: Normal range of motion. She exhibits edema and tenderness.  Patient left foot forefoot with redness and swelling great toe and distal part of the foot. Increased warmth mild tenderness to palpation. Dorsalis pedis pulses 2+. Also right knee without any erythema but some increased warmth. Patient with pain with range of motion of the knee. No obvious deformity. Right foot dorsalis pedis pulses 2+.  Neurological: She is alert and oriented to person, place, and time. No cranial nerve deficit. She exhibits normal muscle tone. Coordination normal.  Skin: Skin is warm. There is erythema.  Nursing note and vitals reviewed.   ED Course  Procedures (including critical care time) Labs Review Labs Reviewed  PROTIME-INR - Abnormal; Notable for the following:    Prothrombin Time 21.2 (*)    INR 1.84 (*)    All other  components within normal limits    Imaging Review Dg Knee Complete 4 Views Right  01/12/2016  CLINICAL DATA:  Right knee swelling and sharp aching pain x1 day No h/x injury Left lateral foot, lateral heel and dorsal foot pain x1 day no h/x injury. EXAM: RIGHT KNEE - COMPLETE 4+ VIEW COMPARISON:  None. FINDINGS: Mild narrowing and osteophyte formation of the medial and lateral compartments. Moderate joint effusion. Mild narrowing and osteophyte formation in the patellofemoral compartment. Mild meniscal calcification. IMPRESSION: Mild to moderate arthritic change Moderate joint effusion Electronically Signed   By: Skipper Cliche M.D.   On: 01/12/2016 07:11   Dg Foot Complete Left  01/12/2016  CLINICAL DATA:  Right knee swelling and sharp aching pain x1 day No h/x injury Left lateral foot, lateral heel and dorsal foot pain x1 day no h/x injury. EXAM: LEFT FOOT - COMPLETE 3+ VIEW COMPARISON:  None. FINDINGS: Fabric or wrap overlying the foot obscures bony detail. Allowing for this limitation, there is no fracture, dislocation, or periosteal reaction. There is a moderate heel spur. IMPRESSION: Limited study.  No acute findings. Electronically Signed   By: Skipper Cliche M.D.   On: 01/12/2016 07:13   I have personally reviewed and evaluated these images and lab results as part of my medical decision-making.   EKG Interpretation None      MDM   Final diagnoses:  Acute gout of left foot, unspecified cause  Knee pain, acute, right   She has redness and swelling to the left forefoot consistent with a gout flare. Patient also with some increased warmth no erythema to the right knee. X-rays show evidence of arthritis. Patient with recent carpal tunnel surgery on her left arm may have triggered what is probably a gout attack. Will treat with prednisone. Patient's already on 10 mg of hydrocodone at home on a regular basis. Not helping the pain we'll supplement the pain here with a prescription for  hydromorphone. Patient given an IM shot of 2 mg hydromorphone.  Patient's Coumadin level checked for her. INR is still below 2. She can contact her primary care provider in regard to today's results and she had an appointment later today with them for a blood level checked.  A prescription will be provided for a wheelchair.   Fredia Sorrow, MD 01/12/16 [phone]

## 2016-01-12 NOTE — Telephone Encounter (Signed)
Patient missed appointment this morning due to being in hospital. Please call to discuss instructions. / tg

## 2016-01-12 NOTE — Telephone Encounter (Signed)
Missed Lovenox appt with me this morning because she was in the ED with gout.  INR was 1.84.  She was started on Prednisone 40mg  daily x 5 days.  Pt will use last dose of lovenox tonight then stop.  She will continue coumadin 5mg  daily except 7.5mg  on Mondays since prednisone will increase INR.  INR appt made for 01/19/16.

## 2016-01-12 NOTE — ED Notes (Signed)
Pt c/o swelling to right knee that started x 1 day ago; knee is swollen and warm to the touch; pt also c/o right foot pain

## 2016-01-12 NOTE — ED Notes (Signed)
Pt educated on the use of Dilaudid and the risks that can occur.

## 2016-01-14 ENCOUNTER — Encounter (HOSPITAL_COMMUNITY): Payer: Self-pay | Admitting: Emergency Medicine

## 2016-01-14 ENCOUNTER — Emergency Department (HOSPITAL_COMMUNITY)
Admission: EM | Admit: 2016-01-14 | Discharge: 2016-01-14 | Disposition: A | Discharge status: Home / Self Care | Payer: Medicare Other | Attending: Emergency Medicine | Admitting: Emergency Medicine

## 2016-01-14 ENCOUNTER — Emergency Department (HOSPITAL_COMMUNITY): Payer: Medicare Other

## 2016-01-14 DIAGNOSIS — X58XXXA Exposure to other specified factors, initial encounter: Secondary | ICD-10-CM | POA: Insufficient documentation

## 2016-01-14 DIAGNOSIS — S83281A Other tear of lateral meniscus, current injury, right knee, initial encounter: Secondary | ICD-10-CM | POA: Insufficient documentation

## 2016-01-14 DIAGNOSIS — Z7952 Long term (current) use of systemic steroids: Secondary | ICD-10-CM | POA: Diagnosis not present

## 2016-01-14 DIAGNOSIS — Z79899 Other long term (current) drug therapy: Secondary | ICD-10-CM | POA: Diagnosis not present

## 2016-01-14 DIAGNOSIS — E785 Hyperlipidemia, unspecified: Secondary | ICD-10-CM | POA: Diagnosis not present

## 2016-01-14 DIAGNOSIS — Z9889 Other specified postprocedural states: Secondary | ICD-10-CM | POA: Diagnosis not present

## 2016-01-14 DIAGNOSIS — M1990 Unspecified osteoarthritis, unspecified site: Secondary | ICD-10-CM | POA: Diagnosis not present

## 2016-01-14 DIAGNOSIS — Z7984 Long term (current) use of oral hypoglycemic drugs: Secondary | ICD-10-CM | POA: Insufficient documentation

## 2016-01-14 DIAGNOSIS — E669 Obesity, unspecified: Secondary | ICD-10-CM | POA: Insufficient documentation

## 2016-01-14 DIAGNOSIS — N189 Chronic kidney disease, unspecified: Secondary | ICD-10-CM | POA: Diagnosis not present

## 2016-01-14 DIAGNOSIS — Y9289 Other specified places as the place of occurrence of the external cause: Secondary | ICD-10-CM | POA: Insufficient documentation

## 2016-01-14 DIAGNOSIS — R531 Weakness: Secondary | ICD-10-CM | POA: Diagnosis not present

## 2016-01-14 DIAGNOSIS — M25561 Pain in right knee: Secondary | ICD-10-CM | POA: Diagnosis not present

## 2016-01-14 DIAGNOSIS — M10071 Idiopathic gout, right ankle and foot: Secondary | ICD-10-CM | POA: Insufficient documentation

## 2016-01-14 DIAGNOSIS — I129 Hypertensive chronic kidney disease with stage 1 through stage 4 chronic kidney disease, or unspecified chronic kidney disease: Secondary | ICD-10-CM | POA: Diagnosis not present

## 2016-01-14 DIAGNOSIS — Z7901 Long term (current) use of anticoagulants: Secondary | ICD-10-CM | POA: Diagnosis not present

## 2016-01-14 DIAGNOSIS — R404 Transient alteration of awareness: Secondary | ICD-10-CM | POA: Diagnosis not present

## 2016-01-14 DIAGNOSIS — I2510 Atherosclerotic heart disease of native coronary artery without angina pectoris: Secondary | ICD-10-CM | POA: Diagnosis not present

## 2016-01-14 DIAGNOSIS — Y9389 Activity, other specified: Secondary | ICD-10-CM | POA: Diagnosis not present

## 2016-01-14 DIAGNOSIS — M25461 Effusion, right knee: Secondary | ICD-10-CM | POA: Diagnosis not present

## 2016-01-14 DIAGNOSIS — E119 Type 2 diabetes mellitus without complications: Secondary | ICD-10-CM | POA: Insufficient documentation

## 2016-01-14 DIAGNOSIS — Y998 Other external cause status: Secondary | ICD-10-CM | POA: Insufficient documentation

## 2016-01-14 LAB — CBC WITH DIFFERENTIAL/PLATELET
Basophils Absolute: 0 10*3/uL (ref 0.0–0.1)
Basophils Relative: 0 %
Eosinophils Absolute: 0 10*3/uL (ref 0.0–0.7)
Eosinophils Relative: 0 %
HCT: 37.2 % (ref 36.0–46.0)
Hemoglobin: 12.1 g/dL (ref 12.0–15.0)
Lymphocytes Relative: 4 %
Lymphs Abs: 0.6 10*3/uL — ABNORMAL LOW (ref 0.7–4.0)
MCH: 29.7 pg (ref 26.0–34.0)
MCHC: 32.5 g/dL (ref 30.0–36.0)
MCV: 91.4 fL (ref 78.0–100.0)
Monocytes Absolute: 0.8 10*3/uL (ref 0.1–1.0)
Monocytes Relative: 5 %
Neutro Abs: 12.8 10*3/uL — ABNORMAL HIGH (ref 1.7–7.7)
Neutrophils Relative %: 91 %
Platelets: 290 10*3/uL (ref 150–400)
RBC: 4.07 MIL/uL (ref 3.87–5.11)
RDW: 13.5 % (ref 11.5–15.5)
WBC: 14.2 10*3/uL — ABNORMAL HIGH (ref 4.0–10.5)

## 2016-01-14 LAB — BASIC METABOLIC PANEL
Anion gap: 11 (ref 5–15)
BUN: 28 mg/dL — ABNORMAL HIGH (ref 6–20)
CO2: 25 mmol/L (ref 22–32)
Calcium: 10.3 mg/dL (ref 8.9–10.3)
Chloride: 100 mmol/L — ABNORMAL LOW (ref 101–111)
Creatinine, Ser: 1.03 mg/dL — ABNORMAL HIGH (ref 0.44–1.00)
GFR calc Af Amer: >60 mL/min (ref >60)
GFR calc non Af Amer: 55 mL/min — ABNORMAL LOW (ref >60)
Glucose, Bld: 240 mg/dL — ABNORMAL HIGH (ref 65–99)
Potassium: 4.3 mmol/L (ref 3.5–5.1)
Sodium: 136 mmol/L (ref 135–145)

## 2016-01-14 LAB — SYNOVIAL CELL COUNT + DIFF, W/ CRYSTALS
Crystals, Fluid: NONE SEEN
Eosinophils-Synovial: 0 % (ref 0–1)
Lymphocytes-Synovial Fld: 1 % (ref 0–20)
Monocyte-Macrophage-Synovial Fluid: 5 % — ABNORMAL LOW (ref 50–90)
Neutrophil, Synovial: 94 % — ABNORMAL HIGH (ref 0–25)
WBC, Synovial: 27510 /mm3 — ABNORMAL HIGH (ref 0–200)

## 2016-01-14 LAB — PROTIME-INR
INR: 2.34 — ABNORMAL HIGH (ref 0.00–1.49)
Prothrombin Time: 25.4 seconds — ABNORMAL HIGH (ref 11.6–15.2)

## 2016-01-14 MED ORDER — METHYLPREDNISOLONE ACETATE 40 MG/ML IJ SUSP
40.0000 mg | Freq: Once | INTRAMUSCULAR | Status: DC
  Filled 2016-01-14: qty 1

## 2016-01-14 MED ORDER — HYDROMORPHONE HCL 1 MG/ML IJ SOLN
1.0000 mg | Freq: Once | INTRAMUSCULAR | Status: AC
  Administered 2016-01-14: 1 mg via INTRAVENOUS
  Filled 2016-01-14: qty 1

## 2016-01-14 MED ORDER — DEXAMETHASONE SODIUM PHOSPHATE 10 MG/ML IJ SOLN
10.0000 mg | Freq: Once | INTRAMUSCULAR | Status: DC
  Filled 2016-01-14: qty 1

## 2016-01-14 MED ORDER — LORAZEPAM 2 MG/ML IJ SOLN
1.0000 mg | Freq: Once | INTRAMUSCULAR | Status: AC
  Administered 2016-01-14: 1 mg via INTRAVENOUS
  Filled 2016-01-14: qty 1

## 2016-01-14 MED ORDER — BUPIVACAINE HCL (PF) 0.25 % IJ SOLN
5.0000 mL | Freq: Once | INTRAMUSCULAR | Status: DC
  Filled 2016-01-14: qty 5
  Filled 2016-01-14: qty 10

## 2016-01-14 MED ORDER — LIDOCAINE HCL (PF) 1 % IJ SOLN
5.0000 mL | Freq: Once | INTRAMUSCULAR | Status: AC
  Administered 2016-01-14: 5 mL via INTRADERMAL
  Filled 2016-01-14: qty 5

## 2016-01-14 MED ORDER — KETOROLAC TROMETHAMINE 30 MG/ML IJ SOLN
30.0000 mg | Freq: Once | INTRAMUSCULAR | Status: AC
  Administered 2016-01-14: 30 mg via INTRAVENOUS
  Filled 2016-01-14: qty 1

## 2016-01-14 NOTE — ED Provider Notes (Signed)
Medical screening examination/treatment/procedure(s) were conducted as a shared visit with non-physician practitioner(s) and myself.  I personally evaluated the patient during the encounter.  Physical Exam  BP 157/67 mmHg  Pulse 85  Temp(Src) 98.3 F (36.8 C) (Oral)  Resp 18  SpO2 95%  Physical Exam  Constitutional: She appears well-developed and well-nourished.  HENT:  Head: Normocephalic and atraumatic.  Neck: Normal range of motion.  Cardiovascular: Normal rate and regular rhythm.   Pulmonary/Chest: No stridor. No respiratory distress.  Abdominal: She exhibits no distension.  Musculoskeletal: She exhibits edema and tenderness (with rotation of joint, especially lateral).  Neurological: She is alert.  Skin: Skin is warm and dry. No rash noted. No pallor.  Nursing note and vitals reviewed.   ED Course  .Joint Aspiration/Arthrocentesis Date/Time: 01/15/2016 6:59 AM Performed by: Merrily Pew Authorized by: Merrily Pew Consent: Verbal consent obtained. Risks and benefits: risks, benefits and alternatives were discussed Consent given by: patient Patient understanding: patient states understanding of the procedure being performed Required items: required blood products, implants, devices, and special equipment available Patient identity confirmed: verbally with patient Time out: Immediately prior to procedure a "time out" was called to verify the correct patient, procedure, equipment, support staff and site/side marked as required. Indications: diagnostic evaluation,  joint swelling,  pain and possible septic joint  Body area: knee Joint: right knee Local anesthesia used: yes Local anesthetic: lidocaine 1% with epinephrine Anesthetic total: 2 ml Patient sedated: no Preparation: Patient was prepped and draped in the usual sterile fashion. Needle gauge: 18 G Ultrasound guidance: no Approach: lateral Aspirate: bloody Aspirate amount: 10 mL Patient tolerance: Patient  tolerated the procedure well with no immediate complications    MDM 65 yo F w/ atraumatic right knee pain for last few days. No fevers. On exam her right knee is significantly swollen and ballotable with large effusion. No erythema or warmth. Significant pain with rotation. 2/2 h/o DM, atraumatic pain an arthrocentesis done as above and found to have large bloody effusion. Only able to aspirate approximately 10 cc of dark red blood, suspect the rest is clot. Plan will be to MRI to evaluate for occult fx v ligamentous injury. If no fx then will immobilize and fu w/ orthopedics, possible PT.       Merrily Pew, MD 01/15/16 [phone]

## 2016-01-14 NOTE — ED Notes (Signed)
MD and PA at bedside for arthrocentesis

## 2016-01-14 NOTE — ED Notes (Signed)
Per EMS pt from PCP for post op follow up for hand cyst. Per staff pt started having severe pain right knee and was near syncope due to the severe pain. Pt was seen recently for right  Knee pain. Pt received 100 mcg Fentanyl Iv by EMS pain . alert and oriented x 4. NOD at this time.

## 2016-01-14 NOTE — ED Provider Notes (Signed)
CSN: [id number]     Arrival date & time 01/14/16  1004 History   First MD Initiated Contact with Patient 01/14/16 1011     Chief Complaint  Patient presents with  . Knee Pain    right     (Consider location/radiation/quality/duration/timing/severity/associated sxs/prior Treatment) HPI Comments: Patient presents today with right knee pain.  She reports that the knee pain has been present for the past 4 days.  She reports that she felt the pain when she stood up from her chair.  However, she denies any fall or trauma to the knee.  Pain is worse with bearing weight and going up stairs.  She was seen in the ED two days ago for the same.  At that time she had an xray of the knee, which showed mild to moderate arthritis, but was otherwise negative.  She was discharged home with Rx for 40 mg Prednisone daily and also 4 mg Dilaudid, which she reports that she has been taking.  She reports that she has also been using a wheelchair at home.  Pain had improved somewhat until she had to bear weight today while at the office of her Copy.  She denies any fever, chills, nausea, vomiting, numbness, or tingling.    Patient is a 65 y.o. female presenting with knee pain. The history is provided by the patient.  Knee Pain   Past Medical History  Diagnosis Date  . Diabetes mellitus ([place])   . Hyperlipidemia   . AS (aortic stenosis) 09/18/2012    2D Echo EF 55%-60%  . AI (aortic insufficiency)   . Tricuspid regurgitation   . Cough   . Arthritis   . Obese   . Hypertension     dr Donetta Potts  . Chronic kidney disease     renal insuffcentily  . Renal insufficiency   . Coronary artery disease   . Gout     right foot  . PONV (postoperative nausea and vomiting)     with early surgeries , none recent   Past Surgical History  Procedure Laterality Date  . Aortic valve replacement  04/01/2010    21-mm Sr Jude Regent ;mechanical valve   . Replacement  of ascending aortic aneurysm  04/01/2010    28-mm  supracoronary Hemashield tube graft using deep hypothermic circulatory arrest  . Cardiac catheterization  02/09/2010  . Broken arm      wire in lt elbow  . Lumbar laminectomy/decompression microdiscectomy N/A 02/28/2013    Procedure: LUMBAR LAMINECTOMY/DECOMPRESSION MICRODISCECTOMY 2 LEVELS;  Surgeon: Ophelia Charter, MD;  Location: [facility];  Service: Neurosurgery;  Laterality: N/A;  Lumbar three-four and Lumbar four-five laminectomies  . Back surgery    . Fracture surgery    . Ganglion cyst excision Left 01/08/2016    Procedure: LEFT WRIST EXCISION OF VOLAR CYST, EXCISION OF MUCOID TUMOR;  Surgeon: Daryll Brod, MD;  Location: [facility];  Service: Orthopedics;  Laterality: Left;  Marland Kitchen Mass excision Left 01/08/2016    Procedure: EXCISION MUCOID TUMOR LEFT INDEX FINGER DISTAL INTERPHALANGEAL JOINT;  Surgeon: Daryll Brod, MD;  Location: [facility];  Service: Orthopedics;  Laterality: Left;   Family History  Problem Relation Age of Onset  . CAD    . Cancer - Prostate Father   . Cancer - Lung Maternal Grandfather   . Diabetes    . Hypertension     Social History  Substance Use Topics  . Smoking status: Never Smoker   .  Smokeless tobacco: Never Used  . Alcohol Use: No   OB History    No data available     Review of Systems  All other systems reviewed and are negative.     Allergies  Morphine and related  Home Medications   Prior to Admission medications   Medication Sig Start Date End Date Taking? Authorizing Provider  allopurinol (ZYLOPRIM) 100 MG tablet Take 100 mg by mouth daily.    Historical Provider, MD  ALPRAZolam Duanne Moron) 0.5 MG tablet Take 0.5 mg by mouth at bedtime as needed for sleep.     Historical Provider, MD  b complex vitamins tablet Take 1 tablet by mouth daily.    Historical Provider, MD  bisacodyl (DULCOLAX) 5 MG EC tablet Take 5 mg by mouth daily as needed for moderate constipation.    Historical Provider, MD  enoxaparin  (LOVENOX) 120 MG/0.8ML injection Inject 120 mg into the skin every 12 (twelve) hours.    Historical Provider, MD  ferrous sulfate 325 (65 FE) MG tablet Take 325 mg by mouth 2 (two) times daily.    Historical Provider, MD  furosemide (LASIX) 20 MG tablet TAKE ONE TABLET BY MOUTH ONCE DAILY 09/01/15   Mihai Croitoru, MD  glimepiride (AMARYL) 2 MG tablet Take 2 mg by mouth daily before breakfast.    Historical Provider, MD  HYDROcodone-acetaminophen (NORCO) 10-325 MG tablet Take 1 tablet by mouth every 6 (six) hours as needed. Patient taking differently: Take 1 tablet by mouth every 6 (six) hours as needed for moderate pain.  01/08/16   Daryll Brod, MD  HYDROmorphone (DILAUDID) 4 MG tablet Take 1 tablet (4 mg total) by mouth every 6 (six) hours as needed for severe pain. 01/12/16   Fredia Sorrow, MD  lisinopril (PRINIVIL,ZESTRIL) 10 MG tablet Take 10 mg by mouth daily.    Historical Provider, MD  metoprolol (LOPRESSOR) 50 MG tablet TAKE ONE-HALF TABLET BY MOUTH TWICE DAILY 09/30/15   Mihai Croitoru, MD  Multiple Vitamin (MULTIVITAMIN WITH MINERALS) TABS Take 1 tablet by mouth daily.    Historical Provider, MD  multivitamin-lutein [facility]) CAPS Take 1 capsule by mouth daily.    Historical Provider, MD  Omega-3 Fatty Acids (FISH OIL) 1000 MG CAPS Take 1,000 mg by mouth daily.    Historical Provider, MD  oxyCODONE-acetaminophen (PERCOCET/ROXICET) 5-325 MG tablet Take 1 tablet by mouth every 4 (four) hours as needed. Patient not taking: Reported on 01/12/2016 12/07/15   Evalee Jefferson, PA-C  pravastatin (PRAVACHOL) 40 MG tablet Take 40 mg by mouth daily.    Historical Provider, MD  predniSONE (DELTASONE) 10 MG tablet Take 4 tablets (40 mg total) by mouth daily. 01/12/16   Fredia Sorrow, MD  warfarin (COUMADIN) 5 MG tablet TAKE ONE TABLET BY MOUTH ONCE DAILY OR AS DIRECTED 10/29/15   Mihai Croitoru, MD  zolpidem (AMBIEN) 10 MG tablet Take 10 mg by mouth at bedtime.     Historical Provider, MD   SpO2  100% Physical Exam  Constitutional: She appears well-developed and well-nourished.  HENT:  Head: Normocephalic and atraumatic.  Neck: Normal range of motion. Neck supple.  Cardiovascular: Normal rate, regular rhythm and normal heart sounds.   Pulses:      Dorsalis pedis pulses are 2+ on the right side, and 2+ on the left side.  Pulmonary/Chest: Effort normal and breath sounds normal.  Musculoskeletal:  No erythema of the right knee.  Mild warmth of the knee.  Diffuse swelling of the right knee.   Large joint  effusion.  ROM of the right knee is mildly limited secondary to pain.  Neurological: She is alert.  Distal sensation of both feet intact  Skin: Skin is warm and dry.  Psychiatric: She has a normal mood and affect.  Nursing note and vitals reviewed.   ED Course  Procedures (including critical care time) Labs Review Labs Reviewed - No data to display  Imaging Review Mr Knee Right Wo Contrast  01/14/2016  CLINICAL DATA:  Right knee pain, redness and swelling beginning 1 day ago. No known injury. Initial encounter. EXAM: MRI OF THE RIGHT KNEE WITHOUT CONTRAST TECHNIQUE: Multiplanar, multisequence MR imaging of the knee was performed. No intravenous contrast was administered. COMPARISON:  Plain films of the right knee 01/12/2016 FINDINGS: MENISCI Medial meniscus:  Intact. Lateral meniscus: Horizontal tear is seen from the periphery of the posterior horn of the lateral meniscus, throughout the body and into the anterior horn. LIGAMENTS Cruciates:  Intact. Collaterals:  Intact. CARTILAGE Patellofemoral:  Appears nearly completely denuded throughout. Medial:  Unremarkable. Lateral:  Unremarkable. Joint:  A very large knee joint effusion is seen. Popliteal Fossa: Baker's cyst has ruptured with fluid dissecting along the gastrocnemius. Extensor Mechanism:  Intact. Bones: No fracture, erosion or worrisome marrow lesion. Mild subchondral cyst formation in the trochlea is noted. IMPRESSION:  Horizontal tear that extends from the posterior horn of the lateral meniscus into the anterior horn. Very large knee joint effusion. Cause for the effusion is not identified. Patellofemoral osteoarthritis. Ruptured Baker's cyst with fluid dissecting along the gastrocnemius. Electronically Signed   By: Inge Rise M.D.   On: 01/14/2016 14:54   I have personally reviewed and evaluated these images and lab results as part of my medical decision-making.   EKG Interpretation None      MDM   Final diagnoses:  None   Patient presents today with atraumatic knee pain.  She is afebrile.  She has a large joint effusion on exam.  Arthrocentesis performed on the knee with blood aspirate.  No signs of septic joint.  MRI performed, which showed tear of the lateral meniscus.  Patient put in a knee immobilizer.  She still has the Dilaudid Rx at home and has a wheelchair.  Pain improved in the ED.  Patient given referral to Orthopedics.  Stable for discharge.  Return precautions given.   Hyman Bible, PA-C 01/15/16 2224  Merrily Pew, MD 01/16/16 [phone]

## 2016-01-14 NOTE — ED Notes (Signed)
PA at the bedside.

## 2016-01-15 LAB — PROTEIN, SYNOVIAL FLUID: Protein, Synovial Fluid: 3.8 g/dL — ABNORMAL HIGH (ref 1.0–3.0)

## 2016-01-15 LAB — GLUCOSE, SYNOVIAL FLUID: Glucose, Synovial Fluid: <20 mg/dL

## 2016-01-18 LAB — BODY FLUID CULTURE: Culture: NO GROWTH

## 2016-01-19 ENCOUNTER — Ambulatory Visit (INDEPENDENT_AMBULATORY_CARE_PROVIDER_SITE_OTHER): Payer: Medicare Other | Admitting: *Deleted

## 2016-01-19 DIAGNOSIS — Z7901 Long term (current) use of anticoagulants: Secondary | ICD-10-CM

## 2016-01-19 DIAGNOSIS — M25579 Pain in unspecified ankle and joints of unspecified foot: Secondary | ICD-10-CM | POA: Diagnosis not present

## 2016-01-19 DIAGNOSIS — M79671 Pain in right foot: Secondary | ICD-10-CM | POA: Diagnosis not present

## 2016-01-19 DIAGNOSIS — Z954 Presence of other heart-valve replacement: Secondary | ICD-10-CM | POA: Diagnosis not present

## 2016-01-19 DIAGNOSIS — Z952 Presence of prosthetic heart valve: Secondary | ICD-10-CM

## 2016-01-19 DIAGNOSIS — Z5181 Encounter for therapeutic drug level monitoring: Secondary | ICD-10-CM

## 2016-01-19 DIAGNOSIS — M10071 Idiopathic gout, right ankle and foot: Secondary | ICD-10-CM | POA: Diagnosis not present

## 2016-01-19 LAB — POCT INR: INR: 2.1

## 2016-01-20 ENCOUNTER — Other Ambulatory Visit: Payer: Self-pay | Admitting: Cardiovascular Disease

## 2016-01-23 DIAGNOSIS — Z1389 Encounter for screening for other disorder: Secondary | ICD-10-CM | POA: Diagnosis not present

## 2016-01-23 DIAGNOSIS — M179 Osteoarthritis of knee, unspecified: Secondary | ICD-10-CM | POA: Diagnosis not present

## 2016-01-23 DIAGNOSIS — S83281A Other tear of lateral meniscus, current injury, right knee, initial encounter: Secondary | ICD-10-CM | POA: Diagnosis not present

## 2016-01-23 DIAGNOSIS — Z6841 Body Mass Index (BMI) 40.0 and over, adult: Secondary | ICD-10-CM | POA: Diagnosis not present

## 2016-01-26 DIAGNOSIS — M23351 Other meniscus derangements, posterior horn of lateral meniscus, right knee: Secondary | ICD-10-CM | POA: Diagnosis not present

## 2016-01-26 DIAGNOSIS — M25061 Hemarthrosis, right knee: Secondary | ICD-10-CM | POA: Diagnosis not present

## 2016-01-26 DIAGNOSIS — M25561 Pain in right knee: Secondary | ICD-10-CM | POA: Diagnosis not present

## 2016-01-28 DIAGNOSIS — M25061 Hemarthrosis, right knee: Secondary | ICD-10-CM | POA: Diagnosis not present

## 2016-01-28 DIAGNOSIS — M25561 Pain in right knee: Secondary | ICD-10-CM | POA: Diagnosis not present

## 2016-01-28 DIAGNOSIS — M23351 Other meniscus derangements, posterior horn of lateral meniscus, right knee: Secondary | ICD-10-CM | POA: Diagnosis not present

## 2016-02-02 DIAGNOSIS — N182 Chronic kidney disease, stage 2 (mild): Secondary | ICD-10-CM | POA: Diagnosis not present

## 2016-02-02 DIAGNOSIS — R201 Hypoesthesia of skin: Secondary | ICD-10-CM | POA: Diagnosis not present

## 2016-02-02 DIAGNOSIS — E669 Obesity, unspecified: Secondary | ICD-10-CM | POA: Diagnosis not present

## 2016-02-02 DIAGNOSIS — M1991 Primary osteoarthritis, unspecified site: Secondary | ICD-10-CM | POA: Diagnosis not present

## 2016-02-02 DIAGNOSIS — Z1389 Encounter for screening for other disorder: Secondary | ICD-10-CM | POA: Diagnosis not present

## 2016-02-02 DIAGNOSIS — E119 Type 2 diabetes mellitus without complications: Secondary | ICD-10-CM | POA: Diagnosis not present

## 2016-02-02 DIAGNOSIS — Z6841 Body Mass Index (BMI) 40.0 and over, adult: Secondary | ICD-10-CM | POA: Diagnosis not present

## 2016-02-02 DIAGNOSIS — G894 Chronic pain syndrome: Secondary | ICD-10-CM | POA: Diagnosis not present

## 2016-02-03 DIAGNOSIS — M545 Low back pain: Secondary | ICD-10-CM | POA: Diagnosis not present

## 2016-02-03 DIAGNOSIS — M1A3610 Chronic gout due to renal impairment, right knee, without tophus (tophi): Secondary | ICD-10-CM | POA: Diagnosis not present

## 2016-02-03 DIAGNOSIS — M461 Sacroiliitis, not elsewhere classified: Secondary | ICD-10-CM | POA: Diagnosis not present

## 2016-02-03 DIAGNOSIS — M2550 Pain in unspecified joint: Secondary | ICD-10-CM | POA: Diagnosis not present

## 2016-02-03 DIAGNOSIS — R269 Unspecified abnormalities of gait and mobility: Secondary | ICD-10-CM | POA: Diagnosis not present

## 2016-02-03 DIAGNOSIS — G2581 Restless legs syndrome: Secondary | ICD-10-CM | POA: Diagnosis not present

## 2016-02-03 DIAGNOSIS — G4733 Obstructive sleep apnea (adult) (pediatric): Secondary | ICD-10-CM | POA: Diagnosis not present

## 2016-02-03 DIAGNOSIS — Z79899 Other long term (current) drug therapy: Secondary | ICD-10-CM | POA: Diagnosis not present

## 2016-02-03 DIAGNOSIS — G4701 Insomnia due to medical condition: Secondary | ICD-10-CM | POA: Diagnosis not present

## 2016-02-04 DIAGNOSIS — M25061 Hemarthrosis, right knee: Secondary | ICD-10-CM | POA: Diagnosis not present

## 2016-02-04 DIAGNOSIS — M23351 Other meniscus derangements, posterior horn of lateral meniscus, right knee: Secondary | ICD-10-CM | POA: Diagnosis not present

## 2016-02-04 DIAGNOSIS — M25561 Pain in right knee: Secondary | ICD-10-CM | POA: Diagnosis not present

## 2016-02-05 ENCOUNTER — Other Ambulatory Visit (HOSPITAL_COMMUNITY): Payer: Self-pay | Admitting: Respiratory Therapy

## 2016-02-05 DIAGNOSIS — G4733 Obstructive sleep apnea (adult) (pediatric): Secondary | ICD-10-CM

## 2016-02-09 ENCOUNTER — Ambulatory Visit (INDEPENDENT_AMBULATORY_CARE_PROVIDER_SITE_OTHER): Payer: Medicare Other | Admitting: *Deleted

## 2016-02-09 DIAGNOSIS — Z954 Presence of other heart-valve replacement: Secondary | ICD-10-CM | POA: Diagnosis not present

## 2016-02-09 DIAGNOSIS — Z7901 Long term (current) use of anticoagulants: Secondary | ICD-10-CM

## 2016-02-09 DIAGNOSIS — Z952 Presence of prosthetic heart valve: Secondary | ICD-10-CM

## 2016-02-09 DIAGNOSIS — Z5181 Encounter for therapeutic drug level monitoring: Secondary | ICD-10-CM

## 2016-02-09 LAB — POCT INR: INR: 2

## 2016-02-11 DIAGNOSIS — M6281 Muscle weakness (generalized): Secondary | ICD-10-CM | POA: Diagnosis not present

## 2016-02-11 DIAGNOSIS — M25561 Pain in right knee: Secondary | ICD-10-CM | POA: Diagnosis not present

## 2016-02-11 DIAGNOSIS — R2689 Other abnormalities of gait and mobility: Secondary | ICD-10-CM | POA: Diagnosis not present

## 2016-02-18 DIAGNOSIS — M6281 Muscle weakness (generalized): Secondary | ICD-10-CM | POA: Diagnosis not present

## 2016-02-18 DIAGNOSIS — R2689 Other abnormalities of gait and mobility: Secondary | ICD-10-CM | POA: Diagnosis not present

## 2016-02-18 DIAGNOSIS — M25561 Pain in right knee: Secondary | ICD-10-CM | POA: Diagnosis not present

## 2016-02-20 DIAGNOSIS — M6281 Muscle weakness (generalized): Secondary | ICD-10-CM | POA: Diagnosis not present

## 2016-02-20 DIAGNOSIS — M25561 Pain in right knee: Secondary | ICD-10-CM | POA: Diagnosis not present

## 2016-02-20 DIAGNOSIS — M10071 Idiopathic gout, right ankle and foot: Secondary | ICD-10-CM | POA: Diagnosis not present

## 2016-02-20 DIAGNOSIS — R2689 Other abnormalities of gait and mobility: Secondary | ICD-10-CM | POA: Diagnosis not present

## 2016-02-26 DIAGNOSIS — R2689 Other abnormalities of gait and mobility: Secondary | ICD-10-CM | POA: Diagnosis not present

## 2016-02-26 DIAGNOSIS — M25561 Pain in right knee: Secondary | ICD-10-CM | POA: Diagnosis not present

## 2016-02-26 DIAGNOSIS — M6281 Muscle weakness (generalized): Secondary | ICD-10-CM | POA: Diagnosis not present

## 2016-03-01 DIAGNOSIS — M79671 Pain in right foot: Secondary | ICD-10-CM | POA: Diagnosis not present

## 2016-03-01 DIAGNOSIS — M6281 Muscle weakness (generalized): Secondary | ICD-10-CM | POA: Diagnosis not present

## 2016-03-01 DIAGNOSIS — R2689 Other abnormalities of gait and mobility: Secondary | ICD-10-CM | POA: Diagnosis not present

## 2016-03-01 DIAGNOSIS — M10071 Idiopathic gout, right ankle and foot: Secondary | ICD-10-CM | POA: Diagnosis not present

## 2016-03-01 DIAGNOSIS — M25561 Pain in right knee: Secondary | ICD-10-CM | POA: Diagnosis not present

## 2016-03-04 ENCOUNTER — Ambulatory Visit: Payer: Medicare Other | Attending: Neurology | Admitting: Neurology

## 2016-03-04 DIAGNOSIS — M6281 Muscle weakness (generalized): Secondary | ICD-10-CM | POA: Diagnosis not present

## 2016-03-04 DIAGNOSIS — M1A3610 Chronic gout due to renal impairment, right knee, without tophus (tophi): Secondary | ICD-10-CM | POA: Diagnosis not present

## 2016-03-04 DIAGNOSIS — R0683 Snoring: Secondary | ICD-10-CM | POA: Diagnosis not present

## 2016-03-04 DIAGNOSIS — M461 Sacroiliitis, not elsewhere classified: Secondary | ICD-10-CM | POA: Diagnosis not present

## 2016-03-04 DIAGNOSIS — Z79899 Other long term (current) drug therapy: Secondary | ICD-10-CM | POA: Insufficient documentation

## 2016-03-04 DIAGNOSIS — G4733 Obstructive sleep apnea (adult) (pediatric): Secondary | ICD-10-CM | POA: Insufficient documentation

## 2016-03-04 DIAGNOSIS — G2581 Restless legs syndrome: Secondary | ICD-10-CM | POA: Diagnosis not present

## 2016-03-04 DIAGNOSIS — Z7901 Long term (current) use of anticoagulants: Secondary | ICD-10-CM | POA: Insufficient documentation

## 2016-03-04 DIAGNOSIS — M25561 Pain in right knee: Secondary | ICD-10-CM | POA: Diagnosis not present

## 2016-03-04 DIAGNOSIS — G4701 Insomnia due to medical condition: Secondary | ICD-10-CM | POA: Diagnosis not present

## 2016-03-04 DIAGNOSIS — R2689 Other abnormalities of gait and mobility: Secondary | ICD-10-CM | POA: Diagnosis not present

## 2016-03-04 DIAGNOSIS — R269 Unspecified abnormalities of gait and mobility: Secondary | ICD-10-CM | POA: Diagnosis not present

## 2016-03-08 ENCOUNTER — Ambulatory Visit (INDEPENDENT_AMBULATORY_CARE_PROVIDER_SITE_OTHER): Payer: Medicare Other | Admitting: *Deleted

## 2016-03-08 DIAGNOSIS — Z7901 Long term (current) use of anticoagulants: Secondary | ICD-10-CM

## 2016-03-08 DIAGNOSIS — Z5181 Encounter for therapeutic drug level monitoring: Secondary | ICD-10-CM | POA: Diagnosis not present

## 2016-03-08 DIAGNOSIS — Z954 Presence of other heart-valve replacement: Secondary | ICD-10-CM

## 2016-03-08 DIAGNOSIS — Z952 Presence of prosthetic heart valve: Secondary | ICD-10-CM

## 2016-03-08 LAB — POCT INR: INR: 2.4

## 2016-03-09 DIAGNOSIS — G4733 Obstructive sleep apnea (adult) (pediatric): Secondary | ICD-10-CM | POA: Diagnosis not present

## 2016-03-09 DIAGNOSIS — G2581 Restless legs syndrome: Secondary | ICD-10-CM | POA: Diagnosis not present

## 2016-03-09 DIAGNOSIS — M5416 Radiculopathy, lumbar region: Secondary | ICD-10-CM | POA: Diagnosis not present

## 2016-03-09 DIAGNOSIS — Z79891 Long term (current) use of opiate analgesic: Secondary | ICD-10-CM | POA: Diagnosis not present

## 2016-03-09 DIAGNOSIS — G4701 Insomnia due to medical condition: Secondary | ICD-10-CM | POA: Diagnosis not present

## 2016-03-10 DIAGNOSIS — M25561 Pain in right knee: Secondary | ICD-10-CM | POA: Diagnosis not present

## 2016-03-10 DIAGNOSIS — R2689 Other abnormalities of gait and mobility: Secondary | ICD-10-CM | POA: Diagnosis not present

## 2016-03-10 DIAGNOSIS — M6281 Muscle weakness (generalized): Secondary | ICD-10-CM | POA: Diagnosis not present

## 2016-03-11 NOTE — Procedures (Signed)
[place] A. Merlene Laughter, MD     [email or web address]             NOCTURNAL POLYSOMNOGRAPHY   LOCATION: [facility]  Patient Name: [name], [name] Date: 03/04/2016 Gender: Female D.O.B: 01/12/1951 Age (years): 16 Referring Provider: Not Available Height (inches): 62 Interpreting Physician: Phillips Odor MD, ABSM Weight (lbs): 240 RPSGT: Peak, Robert BMI: 44 MRN: [id number] Neck Size: 17.50 CLINICAL INFORMATION Sleep Study Type: NPSG Indication for sleep study: N/A Epworth Sleepiness Score: 2 SLEEP STUDY TECHNIQUE As per the AASM Manual for the Scoring of Sleep and Associated Events v2.3 (April 2016) with a hypopnea requiring 4% desaturations. The channels recorded and monitored were frontal, central and occipital EEG, electrooculogram (EOG), submentalis EMG (chin), nasal and oral airflow, thoracic and abdominal wall motion, anterior tibialis EMG, snore microphone, electrocardiogram, and pulse oximetry. MEDICATIONS Patient's medications include: N/A. Medications self-administered by patient during sleep study : No sleep medicine administered.  Current outpatient prescriptions:  .  allopurinol (ZYLOPRIM) 100 MG tablet, Take 100 mg by mouth daily., Disp: , Rfl:  .  ALPRAZolam (XANAX) 0.5 MG tablet, Take 0.5 mg by mouth at bedtime as needed for sleep. , Disp: , Rfl:  .  b complex vitamins tablet, Take 1 tablet by mouth daily., Disp: , Rfl:  .  bisacodyl (DULCOLAX) 5 MG EC tablet, Take 5 mg by mouth daily as needed for moderate constipation., Disp: , Rfl:  .  ferrous sulfate 325 (65 FE) MG tablet, Take 325 mg by mouth 2 (two) times daily., Disp: , Rfl:  .  furosemide (LASIX) 20 MG tablet, TAKE ONE TABLET BY MOUTH ONCE DAILY, Disp: 30 tablet, Rfl: 11 .  glimepiride (AMARYL) 2 MG tablet, Take 2 mg by mouth daily before breakfast., Disp: , Rfl:  .  HYDROcodone-acetaminophen (NORCO) 10-325 MG tablet, Take 1 tablet by mouth every 6 (six) hours as needed.  (Patient not taking: Reported on 01/14/2016), Disp: 30 tablet, Rfl: 0 .  HYDROmorphone (DILAUDID) 4 MG tablet, Take 1 tablet (4 mg total) by mouth every 6 (six) hours as needed for severe pain., Disp: 20 tablet, Rfl: 0 .  lisinopril (PRINIVIL,ZESTRIL) 10 MG tablet, Take 10 mg by mouth daily., Disp: , Rfl:  .  metoprolol (LOPRESSOR) 50 MG tablet, TAKE ONE-HALF TABLET BY MOUTH TWICE DAILY, Disp: 30 tablet, Rfl: 11 .  Multiple Vitamin (MULTIVITAMIN WITH MINERALS) TABS, Take 1 tablet by mouth daily., Disp: , Rfl:  .  multivitamin-lutein (OCUVITE-LUTEIN) CAPS, Take 1 capsule by mouth daily., Disp: , Rfl:  .  Omega-3 Fatty Acids (FISH OIL) 1000 MG CAPS, Take 1,000 mg by mouth daily., Disp: , Rfl:  .  oxyCODONE-acetaminophen (PERCOCET/ROXICET) 5-325 MG tablet, Take 1 tablet by mouth every 4 (four) hours as needed. (Patient not taking: Reported on 01/12/2016), Disp: 20 tablet, Rfl: 0 .  pravastatin (PRAVACHOL) 40 MG tablet, Take 40 mg by mouth daily., Disp: , Rfl:  .  predniSONE (DELTASONE) 10 MG tablet, Take 4 tablets (40 mg total) by mouth daily. (Patient taking differently: Take 40 mg by mouth daily. Started 04/24 for 5 days), Disp: 20 tablet, Rfl: 0 .  warfarin (COUMADIN) 5 MG tablet, TAKE ONE TABLET BY MOUTH ONCE DAILY OR AS DIRECTED, Disp: 35 tablet, Rfl: 2 .  zolpidem (AMBIEN) 10 MG tablet, Take 10 mg by mouth at bedtime. , Disp: , Rfl:   SLEEP ARCHITECTURE The study was initiated at 10:45:02 PM and ended at 5:31:52 AM. Sleep onset time was 10.5 minutes and the sleep efficiency  was 82.9%. The total sleep time was 337.3 minutes. Stage REM latency was 211.5 minutes. The patient spent 7.11% of the night in stage N1 sleep, 76.14% in stage N2 sleep, 0.00% in stage N3 and 16.75% in REM. Alpha intrusion was absent. Supine sleep was 91.01%. RESPIRATORY PARAMETERS The overall apnea/hypopnea index (AHI) was 6.9 per hour. There were 2 total apneas, including 1 obstructive, 1 central and 0 mixed apneas. There  were 37 hypopneas and 1 RERAs. The AHI during Stage REM sleep was 29.7 per hour. AHI while supine was 7.6 per hour. The mean oxygen saturation was 92.48%. The minimum SpO2 during sleep was 79.00%. Soft snoring was noted during this study. CARDIAC DATA The 2 lead EKG demonstrated sinus rhythm. The mean heart rate was 50.76 beats per minute. Other EKG findings include: None. LEG MOVEMENT DATA The total PLMS were 0 with a resulting PLMS index of 0.00. Associated arousal with leg movement index was 0.0.   IMPRESSIONS - Mild obstructive sleep apnea worse during REM sleep not requiring positive pressure treatment.   Delano Metz, MD Diplomate, American Board of Sleep Medicine.

## 2016-03-12 DIAGNOSIS — M6281 Muscle weakness (generalized): Secondary | ICD-10-CM | POA: Diagnosis not present

## 2016-03-12 DIAGNOSIS — M25561 Pain in right knee: Secondary | ICD-10-CM | POA: Diagnosis not present

## 2016-03-12 DIAGNOSIS — R2689 Other abnormalities of gait and mobility: Secondary | ICD-10-CM | POA: Diagnosis not present

## 2016-03-15 DIAGNOSIS — M6281 Muscle weakness (generalized): Secondary | ICD-10-CM | POA: Diagnosis not present

## 2016-03-15 DIAGNOSIS — R2689 Other abnormalities of gait and mobility: Secondary | ICD-10-CM | POA: Diagnosis not present

## 2016-03-15 DIAGNOSIS — M25561 Pain in right knee: Secondary | ICD-10-CM | POA: Diagnosis not present

## 2016-03-17 DIAGNOSIS — M6281 Muscle weakness (generalized): Secondary | ICD-10-CM | POA: Diagnosis not present

## 2016-03-17 DIAGNOSIS — R2689 Other abnormalities of gait and mobility: Secondary | ICD-10-CM | POA: Diagnosis not present

## 2016-03-17 DIAGNOSIS — M25561 Pain in right knee: Secondary | ICD-10-CM | POA: Diagnosis not present

## 2016-03-24 DIAGNOSIS — M25561 Pain in right knee: Secondary | ICD-10-CM | POA: Diagnosis not present

## 2016-03-24 DIAGNOSIS — R2689 Other abnormalities of gait and mobility: Secondary | ICD-10-CM | POA: Diagnosis not present

## 2016-03-24 DIAGNOSIS — M6281 Muscle weakness (generalized): Secondary | ICD-10-CM | POA: Diagnosis not present

## 2016-03-26 DIAGNOSIS — M25561 Pain in right knee: Secondary | ICD-10-CM | POA: Diagnosis not present

## 2016-03-26 DIAGNOSIS — R2689 Other abnormalities of gait and mobility: Secondary | ICD-10-CM | POA: Diagnosis not present

## 2016-03-26 DIAGNOSIS — M6281 Muscle weakness (generalized): Secondary | ICD-10-CM | POA: Diagnosis not present

## 2016-04-12 ENCOUNTER — Ambulatory Visit (INDEPENDENT_AMBULATORY_CARE_PROVIDER_SITE_OTHER): Payer: Medicare Other | Admitting: *Deleted

## 2016-04-12 DIAGNOSIS — Z5181 Encounter for therapeutic drug level monitoring: Secondary | ICD-10-CM | POA: Diagnosis not present

## 2016-04-12 DIAGNOSIS — Z952 Presence of prosthetic heart valve: Secondary | ICD-10-CM

## 2016-04-12 DIAGNOSIS — Z954 Presence of other heart-valve replacement: Secondary | ICD-10-CM | POA: Diagnosis not present

## 2016-04-12 DIAGNOSIS — Z7901 Long term (current) use of anticoagulants: Secondary | ICD-10-CM | POA: Diagnosis not present

## 2016-04-12 LAB — POCT INR: INR: 2.7

## 2016-05-05 ENCOUNTER — Other Ambulatory Visit: Payer: Self-pay | Admitting: Cardiovascular Disease

## 2016-05-05 DIAGNOSIS — Z79891 Long term (current) use of opiate analgesic: Secondary | ICD-10-CM | POA: Diagnosis not present

## 2016-05-05 DIAGNOSIS — G4701 Insomnia due to medical condition: Secondary | ICD-10-CM | POA: Diagnosis not present

## 2016-05-05 DIAGNOSIS — G2581 Restless legs syndrome: Secondary | ICD-10-CM | POA: Diagnosis not present

## 2016-05-05 DIAGNOSIS — G4733 Obstructive sleep apnea (adult) (pediatric): Secondary | ICD-10-CM | POA: Diagnosis not present

## 2016-05-05 DIAGNOSIS — M5416 Radiculopathy, lumbar region: Secondary | ICD-10-CM | POA: Diagnosis not present

## 2016-05-10 ENCOUNTER — Ambulatory Visit (INDEPENDENT_AMBULATORY_CARE_PROVIDER_SITE_OTHER): Payer: Medicare Other | Admitting: *Deleted

## 2016-05-10 DIAGNOSIS — Z5181 Encounter for therapeutic drug level monitoring: Secondary | ICD-10-CM | POA: Diagnosis not present

## 2016-05-10 DIAGNOSIS — Z952 Presence of prosthetic heart valve: Secondary | ICD-10-CM

## 2016-05-10 DIAGNOSIS — Z79899 Other long term (current) drug therapy: Secondary | ICD-10-CM | POA: Diagnosis not present

## 2016-05-10 DIAGNOSIS — Z7901 Long term (current) use of anticoagulants: Secondary | ICD-10-CM | POA: Diagnosis not present

## 2016-05-10 DIAGNOSIS — Z954 Presence of other heart-valve replacement: Secondary | ICD-10-CM

## 2016-05-10 DIAGNOSIS — N183 Chronic kidney disease, stage 3 (moderate): Secondary | ICD-10-CM | POA: Diagnosis not present

## 2016-05-10 DIAGNOSIS — I10 Essential (primary) hypertension: Secondary | ICD-10-CM | POA: Diagnosis not present

## 2016-05-10 DIAGNOSIS — D509 Iron deficiency anemia, unspecified: Secondary | ICD-10-CM | POA: Diagnosis not present

## 2016-05-10 DIAGNOSIS — R809 Proteinuria, unspecified: Secondary | ICD-10-CM | POA: Diagnosis not present

## 2016-05-10 DIAGNOSIS — E559 Vitamin D deficiency, unspecified: Secondary | ICD-10-CM | POA: Diagnosis not present

## 2016-05-10 LAB — POCT INR: INR: 2.6

## 2016-05-12 DIAGNOSIS — R809 Proteinuria, unspecified: Secondary | ICD-10-CM | POA: Diagnosis not present

## 2016-05-12 DIAGNOSIS — E669 Obesity, unspecified: Secondary | ICD-10-CM | POA: Diagnosis not present

## 2016-05-12 DIAGNOSIS — E559 Vitamin D deficiency, unspecified: Secondary | ICD-10-CM | POA: Diagnosis not present

## 2016-05-12 DIAGNOSIS — N183 Chronic kidney disease, stage 3 (moderate): Secondary | ICD-10-CM | POA: Diagnosis not present

## 2016-05-25 DIAGNOSIS — Z6841 Body Mass Index (BMI) 40.0 and over, adult: Secondary | ICD-10-CM | POA: Diagnosis not present

## 2016-05-25 DIAGNOSIS — R201 Hypoesthesia of skin: Secondary | ICD-10-CM | POA: Diagnosis not present

## 2016-05-25 DIAGNOSIS — E669 Obesity, unspecified: Secondary | ICD-10-CM | POA: Diagnosis not present

## 2016-05-25 DIAGNOSIS — N183 Chronic kidney disease, stage 3 (moderate): Secondary | ICD-10-CM | POA: Diagnosis not present

## 2016-05-25 DIAGNOSIS — J069 Acute upper respiratory infection, unspecified: Secondary | ICD-10-CM | POA: Diagnosis not present

## 2016-05-25 DIAGNOSIS — E782 Mixed hyperlipidemia: Secondary | ICD-10-CM | POA: Diagnosis not present

## 2016-05-25 DIAGNOSIS — L84 Corns and callosities: Secondary | ICD-10-CM | POA: Diagnosis not present

## 2016-05-25 DIAGNOSIS — E119 Type 2 diabetes mellitus without complications: Secondary | ICD-10-CM | POA: Diagnosis not present

## 2016-05-25 DIAGNOSIS — I10 Essential (primary) hypertension: Secondary | ICD-10-CM | POA: Diagnosis not present

## 2016-05-25 DIAGNOSIS — Z1389 Encounter for screening for other disorder: Secondary | ICD-10-CM | POA: Diagnosis not present

## 2016-06-21 ENCOUNTER — Ambulatory Visit (INDEPENDENT_AMBULATORY_CARE_PROVIDER_SITE_OTHER): Payer: Medicare Other | Admitting: *Deleted

## 2016-06-21 DIAGNOSIS — Z7901 Long term (current) use of anticoagulants: Secondary | ICD-10-CM | POA: Diagnosis not present

## 2016-06-21 DIAGNOSIS — Z952 Presence of prosthetic heart valve: Secondary | ICD-10-CM | POA: Diagnosis not present

## 2016-06-21 DIAGNOSIS — Z5181 Encounter for therapeutic drug level monitoring: Secondary | ICD-10-CM

## 2016-06-21 LAB — POCT INR: INR: 2.4

## 2016-07-05 DIAGNOSIS — G4733 Obstructive sleep apnea (adult) (pediatric): Secondary | ICD-10-CM | POA: Diagnosis not present

## 2016-07-05 DIAGNOSIS — G2581 Restless legs syndrome: Secondary | ICD-10-CM | POA: Diagnosis not present

## 2016-07-05 DIAGNOSIS — Z79891 Long term (current) use of opiate analgesic: Secondary | ICD-10-CM | POA: Diagnosis not present

## 2016-07-05 DIAGNOSIS — G4701 Insomnia due to medical condition: Secondary | ICD-10-CM | POA: Diagnosis not present

## 2016-07-05 DIAGNOSIS — M5416 Radiculopathy, lumbar region: Secondary | ICD-10-CM | POA: Diagnosis not present

## 2016-07-06 ENCOUNTER — Encounter: Payer: Self-pay | Admitting: Endocrinology

## 2016-07-06 ENCOUNTER — Ambulatory Visit (INDEPENDENT_AMBULATORY_CARE_PROVIDER_SITE_OTHER): Payer: Medicare Other | Admitting: Endocrinology

## 2016-07-06 VITALS — BP 128/84 | HR 76 | Temp 98.4°F | Resp 16 | Ht 62.0 in | Wt 246.4 lb

## 2016-07-06 DIAGNOSIS — R5383 Other fatigue: Secondary | ICD-10-CM | POA: Diagnosis not present

## 2016-07-06 DIAGNOSIS — K760 Fatty (change of) liver, not elsewhere classified: Secondary | ICD-10-CM

## 2016-07-06 DIAGNOSIS — E1169 Type 2 diabetes mellitus with other specified complication: Secondary | ICD-10-CM

## 2016-07-06 DIAGNOSIS — E210 Primary hyperparathyroidism: Secondary | ICD-10-CM | POA: Diagnosis not present

## 2016-07-06 DIAGNOSIS — L659 Nonscarring hair loss, unspecified: Secondary | ICD-10-CM

## 2016-07-06 DIAGNOSIS — E669 Obesity, unspecified: Secondary | ICD-10-CM

## 2016-07-06 DIAGNOSIS — R635 Abnormal weight gain: Secondary | ICD-10-CM

## 2016-07-06 DIAGNOSIS — E213 Hyperparathyroidism, unspecified: Secondary | ICD-10-CM | POA: Diagnosis not present

## 2016-07-06 LAB — RENAL FUNCTION PANEL
Albumin: 4.2 g/dL (ref 3.5–5.2)
BUN: 21 mg/dL (ref 6–23)
CO2: 31 mEq/L (ref 19–32)
Calcium: 11 mg/dL — ABNORMAL HIGH (ref 8.4–10.5)
Chloride: 105 mEq/L (ref 96–112)
Creatinine, Ser: 0.94 mg/dL (ref 0.40–1.20)
GFR: 63.14 mL/min (ref >60.00)
Glucose, Bld: 74 mg/dL (ref 70–99)
Phosphorus: 3 mg/dL (ref 2.3–4.6)
Potassium: 4.8 mEq/L (ref 3.5–5.1)
Sodium: 142 mEq/L (ref 135–145)

## 2016-07-06 LAB — T4, FREE: Free T4: 0.92 ng/dL (ref 0.60–1.60)

## 2016-07-06 LAB — VITAMIN D 25 HYDROXY (VIT D DEFICIENCY, FRACTURES): VITD: 39.13 ng/mL (ref 30.00–100.00)

## 2016-07-06 LAB — TSH: TSH: 0.88 u[IU]/mL (ref 0.35–4.50)

## 2016-07-06 NOTE — Progress Notes (Addendum)
Patient ID: [name], female   DOB: 1951/01/12, 65 y.o.   MRN: [id number]            Chief complaint: High calcium  History of Present Illness:   Referring physician: Gerarda Fraction   Review of records show that she has had a high calcium since 2014  Most recent calcium done by PCP was in 09/2015. However she thinks she is also followed by her nephrologist but no records of this are available  Lab Results  Component Value Date   CALCIUM 10.3 01/14/2016   CALCIUM 10.6 (H) 01/07/2016   CALCIUM 10.0 12/06/2015   CALCIUM 10.7 (H) 02/22/2013   CALCIUM 9.9 04/06/2010   CALCIUM 9.7 04/05/2010   CALCIUM 8.8 04/04/2010   CALCIUM 8.3 (L) 04/03/2010   CALCIUM 8.0 (L) 04/02/2010    The hypercalcemia is not associated with any nephrolithiasis, sarcoidosis, known carcinoma, hyperthyroidism.  Stress fracture of foot Occurred 2-3 years ago. She has not had any height loss, she is previously had a height of 5 feet 2 inches  Prior serologic and radiologic studies have included:  PTH level was 75 done in 05/2016 but report is not available  Lab Results  Component Value Date   CALCIUM 10.3 01/14/2016   CAION 1.36 (H) 01/08/2016      25 (OH) Vitamin D level 14.7, No further levels available  She is taking supplements with Vitamin D3 1500 U, 4x per week    Medication List       Accurate as of 07/06/16 11:46 AM. Always use your most recent med list.          allopurinol 100 MG tablet Commonly known as:  ZYLOPRIM Take 100 mg by mouth daily.   ALPRAZolam 0.5 MG tablet Commonly known as:  XANAX Take 0.5 mg by mouth at bedtime as needed for sleep.   b complex vitamins tablet Take 1 tablet by mouth daily.   bisacodyl 5 MG EC tablet Commonly known as:  DULCOLAX Take 5 mg by mouth daily as needed for moderate constipation.   dextromethorphan-guaiFENesin 30-600 MG 12hr tablet Commonly known as:  MUCINEX DM Take 1 tablet by mouth 2 (two) times daily.   ferrous sulfate 325  (65 FE) MG tablet Take 325 mg by mouth 2 (two) times daily.   Fish Oil 1000 MG Caps Take 1,000 mg by mouth daily.   furosemide 20 MG tablet Commonly known as:  LASIX TAKE ONE TABLET BY MOUTH ONCE DAILY   glimepiride 2 MG tablet Commonly known as:  AMARYL Take 2 mg by mouth daily before breakfast.   lisinopril 10 MG tablet Commonly known as:  PRINIVIL,ZESTRIL Take 10 mg by mouth daily.   metoprolol 50 MG tablet Commonly known as:  LOPRESSOR TAKE ONE-HALF TABLET BY MOUTH TWICE DAILY   multivitamin with minerals Tabs tablet Take 1 tablet by mouth daily.   multivitamin-lutein Caps capsule Take 1 capsule by mouth daily.   oxyCODONE-acetaminophen 5-325 MG tablet Commonly known as:  PERCOCET/ROXICET Take 1 tablet by mouth every 4 (four) hours as needed.   pravastatin 40 MG tablet Commonly known as:  PRAVACHOL Take 40 mg by mouth daily.   warfarin 5 MG tablet Commonly known as:  COUMADIN TAKE ONE TABLET BY MOUTH ONCE DAILY OR  AS  DIRECTED   zolpidem 10 MG tablet Commonly known as:  AMBIEN Take 10 mg by mouth at bedtime.       Allergies:  Allergies  Allergen Reactions  . Morphine And Related  Other (See Comments)    BP drops    Past Medical History:  Diagnosis Date  . AI (aortic insufficiency)   . Arthritis   . AS (aortic stenosis) 09/18/2012   2D Echo EF 55%-60%  . Chronic kidney disease    renal insuffcentily  . Coronary artery disease   . Cough   . Diabetes mellitus ([place])   . Gout    right foot  . Hyperlipidemia   . Hypertension    dr Donetta Potts  . Obese   . PONV (postoperative nausea and vomiting)    with early surgeries , none recent  . Renal insufficiency   . Tricuspid regurgitation     Past Surgical History:  Procedure Laterality Date  . AORTIC VALVE REPLACEMENT  04/01/2010   21-mm Sr Jude Regent ;mechanical valve   . BACK SURGERY    . broken arm     wire in lt elbow  . CARDIAC CATHETERIZATION  02/09/2010  . FRACTURE SURGERY    .  GANGLION CYST EXCISION Left 01/08/2016   Procedure: LEFT WRIST EXCISION OF VOLAR CYST, EXCISION OF MUCOID TUMOR;  Surgeon: Daryll Brod, MD;  Location: [facility];  Service: Orthopedics;  Laterality: Left;  . LUMBAR LAMINECTOMY/DECOMPRESSION MICRODISCECTOMY N/A 02/28/2013   Procedure: LUMBAR LAMINECTOMY/DECOMPRESSION MICRODISCECTOMY 2 LEVELS;  Surgeon: Ophelia Charter, MD;  Location: [facility];  Service: Neurosurgery;  Laterality: N/A;  Lumbar three-four and Lumbar four-five laminectomies  . MASS EXCISION Left 01/08/2016   Procedure: EXCISION MUCOID TUMOR LEFT INDEX FINGER DISTAL INTERPHALANGEAL JOINT;  Surgeon: Daryll Brod, MD;  Location: [facility];  Service: Orthopedics;  Laterality: Left;  . replacement  of ascending aortic aneurysm  04/01/2010   28-mm supracoronary Hemashield tube graft using deep hypothermic circulatory arrest    Family History  Problem Relation Age of Onset  . Diabetes Mother   . Cancer - Prostate Father   . CAD    . Cancer - Lung Maternal Grandfather   . Diabetes    . Hypertension      Social History:  reports that she has never smoked. She has never used smokeless tobacco. She reports that she does not drink alcohol or use drugs.  Review of Systems  Constitutional: Positive for weight gain.       Had gained a lot of weight in spring, maximum has been 268 lbs, recently not losing weight despite trying to exercise  HENT: Negative for trouble swallowing.   Eyes: Negative for blurred vision.  Respiratory: Positive for daytime sleepiness. Negative for shortness of breath.   Cardiovascular: Negative for leg swelling.  Gastrointestinal: Negative for constipation.  Endocrine: Positive for cold intolerance and heat intolerance.  Genitourinary: Negative for frequency.  Musculoskeletal: Negative for joint pain and back pain.  Skin: Negative for rash.       Hair thinning present, mostly in the front for the last few months  Neurological:  Negative for numbness and tingling.  Psychiatric/Behavioral: Positive for insomnia.     EXAM:  BP 128/84   Pulse 76   Temp 98.4 F (36.9 C)   Resp 16   Ht 5\' 2"  (1.575 m)   Wt 246 lb 6.4 oz (111.8 kg)   SpO2 97%   BMI 45.07 kg/m   GENERAL: Averagely built and nourished  No cushingoid features  No pallor, clubbing, lymphadenopathy or edema.    Skin:  no rash, hirsutism.  Has mild thinning of the hair on the frontal and vertex area  EYES:  Externally normal.  Fundii:  Exam not indicatedngue normal.  THYROID:  Not palpable.  HEART:  Normal  S1 and S2; no murmur or click.  CHEST:  Normal shape Lungs:   Vescicular breath sounds heard equally.  No crepitations/ wheeze.  ABDOMEN:  No distention.  Liver and spleen not palpable.  No other mass or tenderness.  NEUROLOGICAL: .Reflexes are normal bilaterally at biceps .  SPINE AND JOINTS:  Normal.  Assessment/Plan:   HYPERCALCEMIA:  She has had mild persistent hyperparathyroidism for about 4 years Not clear if she is symptomatic, has no history of known osteoporosis, kidney stones or unusual fractures However she does have a history of a stress fracture of the foot Also has associated vitamin D deficiency which has been supplemented   Discussed the nature of primary hyperparathyroidism as well as normal role of the parathyroid glands.  Discussed potential  effects of hyperparathyroidism long-term on bone health, kidney stones and kidney function Explained to patient that surgery is indicated only there are symptoms of high calcium, calcium level over 1 point above the normal range or known osteoporosis.  Explained that if surgery is indicated this would be done after doing a parathyroid scan and most likely if the patient has single adenoma will need minimally invasive surgery  Currently no recent calcium levels are available and will need to be repeated PTH level from recent labs were done by PCP but report is not  available, reportedly this was high at 75.  Recommendations:  Evaluate bone density for osteoporosis.  This will be scheduled at [facility] calcium level today  Recheck vitamin D level  Check vitamin 1, 25 hydroxy vitamin D level also to guide on adequacy of vitamin D replacement  If she has significant osteoporosis she may be a candidate for parathyroid evaluation otherwise may be managed conservatively with periodic observation  DIABETES type II with obesity: Patient is only on Amaryl with reportedly good control and normal A1c However she does appear to have persistently abnormal liver enzymes likely to be from hepatic steatosis and metabolic syndrome Since she has significant obesity and difficulty losing weight she may be better managed by using a combination of low-dose Actos and metformin or a GLP-1 drug for her diabetes to help her hepatic steatosis Her renal function is adequate for metformin Will deferred this to PCP  HAIR loss: Although it appears to be somewhat female pattern she does not have any features of Cushing's disease or other features of androgen excess Will check testosterone and DHEA S   Bradlee Bridgers 07/06/2016, 11:46 AM   ADDENDUM:  Bone density shows osteopenia at the forearm with T score -1.8 but normal at the femoral neck. Do not recommend parathyroid surgery and will need to see her back in 1 year Calcium is 11.0 and not needing treatment at this time

## 2016-07-07 ENCOUNTER — Other Ambulatory Visit: Payer: Self-pay | Admitting: Internal Medicine

## 2016-07-07 DIAGNOSIS — Z1231 Encounter for screening mammogram for malignant neoplasm of breast: Secondary | ICD-10-CM

## 2016-07-07 DIAGNOSIS — L659 Nonscarring hair loss, unspecified: Secondary | ICD-10-CM | POA: Diagnosis not present

## 2016-07-07 NOTE — Addendum Note (Signed)
Addended by: Kaylyn Lim I on: 07/07/2016 08:01 AM   Modules accepted: Orders

## 2016-07-10 LAB — VITAMIN D 1,25 DIHYDROXY
Vitamin D 1, 25 (OH)2 Total: 43 pg/mL
Vitamin D2 1, 25 (OH)2: <10 pg/mL
Vitamin D3 1, 25 (OH)2: 43 pg/mL

## 2016-07-11 LAB — TESTOSTERONE, TOTAL, LC/MS: Testosterone, total: 20.2 ng/dL (ref 7.0–40.0)

## 2016-07-11 LAB — DHEA-SULFATE: DHEA-SO4: 40.4 ug/dL (ref 20.4–186.6)

## 2016-07-17 NOTE — Progress Notes (Signed)
Please let patient know that the hormone tests are okay, calcium high at 11.  However need to find out from referral department when her bone density is being done at [facility]

## 2016-07-19 ENCOUNTER — Ambulatory Visit (HOSPITAL_COMMUNITY)
Admission: RE | Admit: 2016-07-19 | Discharge: 2016-07-19 | Disposition: A | Discharge status: Home / Self Care | Payer: Medicare Other | Source: Ambulatory Visit | Attending: Internal Medicine | Admitting: Internal Medicine

## 2016-07-19 DIAGNOSIS — Z1231 Encounter for screening mammogram for malignant neoplasm of breast: Secondary | ICD-10-CM | POA: Insufficient documentation

## 2016-07-21 ENCOUNTER — Other Ambulatory Visit (HOSPITAL_COMMUNITY): Payer: Medicare Other

## 2016-07-21 ENCOUNTER — Ambulatory Visit (HOSPITAL_COMMUNITY)
Admission: RE | Admit: 2016-07-21 | Discharge: 2016-07-21 | Disposition: A | Discharge status: Home / Self Care | Payer: Medicare Other | Source: Ambulatory Visit | Attending: Endocrinology | Admitting: Endocrinology

## 2016-07-21 DIAGNOSIS — E210 Primary hyperparathyroidism: Secondary | ICD-10-CM

## 2016-07-21 DIAGNOSIS — M85832 Other specified disorders of bone density and structure, left forearm: Secondary | ICD-10-CM | POA: Insufficient documentation

## 2016-07-23 NOTE — Progress Notes (Signed)
Please let patient know that the bone density is normal at the hip and minimally decreased at the forearm. Do not think she needed surgery for her parathyroid and will need follow-up in one year

## 2016-08-02 ENCOUNTER — Ambulatory Visit (INDEPENDENT_AMBULATORY_CARE_PROVIDER_SITE_OTHER): Payer: Medicare Other | Admitting: *Deleted

## 2016-08-02 DIAGNOSIS — Z952 Presence of prosthetic heart valve: Secondary | ICD-10-CM | POA: Diagnosis not present

## 2016-08-02 DIAGNOSIS — Z7901 Long term (current) use of anticoagulants: Secondary | ICD-10-CM | POA: Diagnosis not present

## 2016-08-02 DIAGNOSIS — Z5181 Encounter for therapeutic drug level monitoring: Secondary | ICD-10-CM

## 2016-08-02 LAB — POCT INR: INR: 2

## 2016-08-04 ENCOUNTER — Other Ambulatory Visit: Payer: Self-pay | Admitting: Cardiovascular Disease

## 2016-08-04 DIAGNOSIS — Z23 Encounter for immunization: Secondary | ICD-10-CM | POA: Diagnosis not present

## 2016-09-01 DIAGNOSIS — G4701 Insomnia due to medical condition: Secondary | ICD-10-CM | POA: Diagnosis not present

## 2016-09-01 DIAGNOSIS — G4733 Obstructive sleep apnea (adult) (pediatric): Secondary | ICD-10-CM | POA: Diagnosis not present

## 2016-09-01 DIAGNOSIS — M5416 Radiculopathy, lumbar region: Secondary | ICD-10-CM | POA: Diagnosis not present

## 2016-09-01 DIAGNOSIS — M461 Sacroiliitis, not elsewhere classified: Secondary | ICD-10-CM | POA: Diagnosis not present

## 2016-09-01 DIAGNOSIS — G2581 Restless legs syndrome: Secondary | ICD-10-CM | POA: Diagnosis not present

## 2016-09-01 DIAGNOSIS — Z79891 Long term (current) use of opiate analgesic: Secondary | ICD-10-CM | POA: Diagnosis not present

## 2016-09-15 ENCOUNTER — Ambulatory Visit (INDEPENDENT_AMBULATORY_CARE_PROVIDER_SITE_OTHER): Payer: Medicare Other | Admitting: *Deleted

## 2016-09-15 DIAGNOSIS — Z5181 Encounter for therapeutic drug level monitoring: Secondary | ICD-10-CM | POA: Diagnosis not present

## 2016-09-15 DIAGNOSIS — I10 Essential (primary) hypertension: Secondary | ICD-10-CM | POA: Diagnosis not present

## 2016-09-15 DIAGNOSIS — Z79899 Other long term (current) drug therapy: Secondary | ICD-10-CM | POA: Diagnosis not present

## 2016-09-15 DIAGNOSIS — Z952 Presence of prosthetic heart valve: Secondary | ICD-10-CM

## 2016-09-15 DIAGNOSIS — R809 Proteinuria, unspecified: Secondary | ICD-10-CM | POA: Diagnosis not present

## 2016-09-15 DIAGNOSIS — D509 Iron deficiency anemia, unspecified: Secondary | ICD-10-CM | POA: Diagnosis not present

## 2016-09-15 DIAGNOSIS — N183 Chronic kidney disease, stage 3 (moderate): Secondary | ICD-10-CM | POA: Diagnosis not present

## 2016-09-15 DIAGNOSIS — Z7901 Long term (current) use of anticoagulants: Secondary | ICD-10-CM | POA: Diagnosis not present

## 2016-09-15 DIAGNOSIS — E559 Vitamin D deficiency, unspecified: Secondary | ICD-10-CM | POA: Diagnosis not present

## 2016-09-15 LAB — POCT INR: INR: 2.7

## 2016-09-22 DIAGNOSIS — I10 Essential (primary) hypertension: Secondary | ICD-10-CM | POA: Diagnosis not present

## 2016-09-22 DIAGNOSIS — E669 Obesity, unspecified: Secondary | ICD-10-CM | POA: Diagnosis not present

## 2016-09-22 DIAGNOSIS — N182 Chronic kidney disease, stage 2 (mild): Secondary | ICD-10-CM | POA: Diagnosis not present

## 2016-09-22 DIAGNOSIS — E559 Vitamin D deficiency, unspecified: Secondary | ICD-10-CM | POA: Diagnosis not present

## 2016-09-24 ENCOUNTER — Ambulatory Visit (INDEPENDENT_AMBULATORY_CARE_PROVIDER_SITE_OTHER): Payer: Medicare Other | Admitting: Cardiovascular Disease

## 2016-09-24 ENCOUNTER — Encounter: Payer: Self-pay | Admitting: Cardiovascular Disease

## 2016-09-24 VITALS — BP 139/88 | HR 71 | Ht 62.0 in | Wt 252.0 lb

## 2016-09-24 DIAGNOSIS — E669 Obesity, unspecified: Secondary | ICD-10-CM

## 2016-09-24 DIAGNOSIS — Z952 Presence of prosthetic heart valve: Secondary | ICD-10-CM | POA: Diagnosis not present

## 2016-09-24 DIAGNOSIS — I10 Essential (primary) hypertension: Secondary | ICD-10-CM

## 2016-09-24 DIAGNOSIS — E782 Mixed hyperlipidemia: Secondary | ICD-10-CM | POA: Diagnosis not present

## 2016-09-24 DIAGNOSIS — E1169 Type 2 diabetes mellitus with other specified complication: Secondary | ICD-10-CM

## 2016-09-24 NOTE — Patient Instructions (Signed)
Medication Instructions: Dr Sallyanne Kuster has recommended making the following medication changes: 1. OKAY to take Co-Q-10 300 mg daily  Labwork: NONE ORDERED  Testing/Procedures: 1. Echocardiogram in 1 YEAR - Your physician has requested that you have an echocardiogram. Echocardiography is a painless test that uses sound waves to create images of your heart. It provides your doctor with information about the size and shape of your heart and how well your heart's chambers and valves are working. This procedure takes approximately one hour. There are no restrictions for this procedure. This will be performed at our [facility] location - [address].  Follow-up: Dr Sallyanne Kuster recommends that you schedule a follow-up appointment in 1 year. You will receive a reminder letter in the mail two months in advance. If you don't receive a letter, please call our office to schedule the follow-up appointment.  If you need a refill on your cardiac medications before your next appointment, please call your pharmacy.

## 2016-09-24 NOTE — Progress Notes (Signed)
Cardiology Office Note    Date:  09/24/2016   ID:  [name], DOB 1951/08/24, MRN [id number]  PCP:  Glo Herring., MD  Cardiologist:   Sanda Klein, MD   No chief complaint on file.   History of Present Illness:  [name] is a 66 y.o. female here for follow-up of mechanical aortic valve replacement and ascending aortic aneurysm repair (Bentall procedure, 2011 , 21 mm valve) performed for bicuspid aortic valve with critical aortic stenosis. Additional problems include hypertension, diabetes mellitus, mixed hyperlipidemia in the setting of morbid obesity  From a cardiac point of view she has done quite well in the last year. She denies any issues with shortness of breath, leg edema, chest pain, palpitations, dizziness or syncope. He has been going to aerobics or water aerobics 5 days a week for the last few months, although she has stopped over the holidays. Unfortunately she has not managed to lose weight. She has not had bleeding problems and is compliant with follow-up for warfarin anticoagulation.  She has had a couple of scaly lesions on her face and wonders if it could be psoriasis. Both her mother and her younger brother have had psoriasis (her brother was advised to stop taking metoprolol). Her blood pressure has recently been a little elevated but she was taking Afrin nasal spray. She stopped doing that yesterday. She had some gout attacks but this has resolved with discontinuation of diuretics and taking allopurinol.  She has had problems with mild hypercalcemia (despite low vit D levels),but this has been mild and asymptomatic. She saw Dr. Dwyane Dee for evaluation and is still seeing Dr. Lowanda Foster for mild renal insufficiency (creatinine has actually improved substantially over the last few years and most recently was 0.9). About a year ago her cholesterol her medications were stopped due to liver test abnormalities. Her LDL cholesterol was elevated at 167. She  has since started pravastatin but I don't think she's had repeat lipid tests since that time. She had excellent LDL cholesterol levels around 60 while taking rosuvastatin.  Past Medical History:  Diagnosis Date  . AI (aortic insufficiency)   . Arthritis   . AS (aortic stenosis) 09/18/2012   2D Echo EF 55%-60%  . Chronic kidney disease    renal insuffcentily  . Coronary artery disease   . Cough   . Diabetes mellitus ([place])   . Gout    right foot  . Hyperlipidemia   . Hypertension    dr Donetta Potts  . Obese   . PONV (postoperative nausea and vomiting)    with early surgeries , none recent  . Renal insufficiency   . Tricuspid regurgitation     Past Surgical History:  Procedure Laterality Date  . AORTIC VALVE REPLACEMENT  04/01/2010   21-mm Sr Jude Regent ;mechanical valve   . BACK SURGERY    . broken arm     wire in lt elbow  . CARDIAC CATHETERIZATION  02/09/2010  . FRACTURE SURGERY    . GANGLION CYST EXCISION Left 01/08/2016   Procedure: LEFT WRIST EXCISION OF VOLAR CYST, EXCISION OF MUCOID TUMOR;  Surgeon: Daryll Brod, MD;  Location: [facility];  Service: Orthopedics;  Laterality: Left;  . LUMBAR LAMINECTOMY/DECOMPRESSION MICRODISCECTOMY N/A 02/28/2013   Procedure: LUMBAR LAMINECTOMY/DECOMPRESSION MICRODISCECTOMY 2 LEVELS;  Surgeon: Ophelia Charter, MD;  Location: [facility];  Service: Neurosurgery;  Laterality: N/A;  Lumbar three-four and Lumbar four-five laminectomies  . MASS EXCISION Left 01/08/2016   Procedure: EXCISION  MUCOID TUMOR LEFT INDEX FINGER DISTAL INTERPHALANGEAL JOINT;  Surgeon: Daryll Brod, MD;  Location: [facility];  Service: Orthopedics;  Laterality: Left;  . replacement  of ascending aortic aneurysm  04/01/2010   28-mm supracoronary Hemashield tube graft using deep hypothermic circulatory arrest    Current Medications: Outpatient Medications Prior to Visit  Medication Sig Dispense Refill  . allopurinol (ZYLOPRIM) 100 MG tablet  Take 100 mg by mouth daily.    Marland Kitchen ALPRAZolam (XANAX) 0.5 MG tablet Take 0.5 mg by mouth at bedtime as needed for sleep.     Marland Kitchen b complex vitamins tablet Take 1 tablet by mouth daily.    . ferrous sulfate 325 (65 FE) MG tablet Take 325 mg by mouth 3 (three) times a week.     Marland Kitchen glimepiride (AMARYL) 2 MG tablet Take 2 mg by mouth daily before breakfast.    . lisinopril (PRINIVIL,ZESTRIL) 10 MG tablet Take 10 mg by mouth daily.    . metoprolol (LOPRESSOR) 50 MG tablet TAKE ONE-HALF TABLET BY MOUTH TWICE DAILY 30 tablet 11  . Multiple Vitamin (MULTIVITAMIN WITH MINERALS) TABS Take 1 tablet by mouth daily.    . multivitamin-lutein (OCUVITE-LUTEIN) CAPS Take 1 capsule by mouth daily.    . Omega-3 Fatty Acids (FISH OIL) 1000 MG CAPS Take 1,000 mg by mouth daily.    Marland Kitchen oxyCODONE-acetaminophen (PERCOCET/ROXICET) 5-325 MG tablet Take 1 tablet by mouth every 4 (four) hours as needed. 20 tablet 0  . pravastatin (PRAVACHOL) 40 MG tablet Take 40 mg by mouth daily.    Marland Kitchen warfarin (COUMADIN) 5 MG tablet TAKE ONE TABLET BY MOUTH ONCE DAILY OR  AS  DIRECTED 35 tablet 2  . zolpidem (AMBIEN) 10 MG tablet Take 10 mg by mouth at bedtime.     . bisacodyl (DULCOLAX) 5 MG EC tablet Take 5 mg by mouth daily as needed for moderate constipation.    Marland Kitchen dextromethorphan-guaiFENesin (MUCINEX DM) 30-600 MG 12hr tablet Take 1 tablet by mouth 2 (two) times daily.    . furosemide (LASIX) 20 MG tablet TAKE ONE TABLET BY MOUTH ONCE DAILY (Patient not taking: Reported on 09/24/2016) 30 tablet 11   No facility-administered medications prior to visit.      Allergies:   Morphine and related   Social History   Social History  . Marital status: Married    Spouse name: N/A  . Number of children: N/A  . Years of education: N/A   Social History Main Topics  . Smoking status: Never Smoker  . Smokeless tobacco: Never Used  . Alcohol use No  . Drug use: No  . Sexual activity: Not Asked   Other Topics Concern  . None   Social  History Narrative  . None     Family History:  The patient's family history includes Cancer - Lung in her maternal grandfather; Cancer - Prostate in her father; Diabetes in her mother.   ROS:   Please see the history of present illness.    ROS All other systems reviewed and are negative.   PHYSICAL EXAM:   VS:  BP 139/88 (BP Location: Right Arm, Patient Position: Sitting, Cuff Size: Large)   Pulse 71   Ht 5\' 2"  (1.575 m)   Wt 252 lb (114.3 kg)   BMI 46.09 kg/m    GEN: Well nourished, well developed, in no acute distress, morbid obesity. HEENT: normal  Neck: no JVD, carotid bruits, or masses Cardiac: Crisp prosthetic valve clicks, RRR; no murmurs, rubs, or  gallops,no edema  Respiratory:  clear to auscultation bilaterally, normal work of breathing GI: soft, nontender, nondistended, + BS MS: no deformity or atrophy  Skin: warm and dry, no rash Neuro:  Alert and Oriented x 3, Strength and sensation are intact Psych: euthymic mood, full affect  Wt Readings from Last 3 Encounters:  09/24/16 252 lb (114.3 kg)  07/06/16 246 lb 6.4 oz (111.8 kg)  03/04/16 240 lb (108.9 kg)      Studies/Labs Reviewed:   EKG:  EKG is ordered today.  The ekg ordered today demonstrates Normal sinus rhythm, LVH with voltage partially masked by obesity, secondary repolarization abnormalities, Left axis deviation  Recent Labs: 12/06/2015: ALT 39 01/14/2016: Hemoglobin 12.1; Platelets 290 07/06/2016: BUN 21; Creatinine, Ser 0.94; Potassium 4.8; Sodium 142; TSH 0.88   Lipid Panel February 2017 total cholesterol 269, triglycerides 223, HDL 57, LDL 167  ASSESSMENT:    1. Hx of mechanical aortic valve replacement   2. Morbid obesity ([place])   3. Mixed hyperlipidemia   4. Diabetes mellitus type 2 in obese ([place])   5. Essential hypertension   6. Hypercalcemia      PLAN:  In order of problems listed above:  1. S/P AVR: Doing well clinically and with normal exam. Plan echo before her next office  appointment.  2. Obesity: Encouraged to continue her regular physical activity even if she has not lost any weight yet. Encourage calorie restriction as well. 3. HLP: He is due to have a repeat lipid profile on pravastatin. Target LDL less than 100. She has diabetes mellitus but does not have coronary artery disease. Okay to take coenzyme Q10 200 mg daily to see if this helps with aches and pains. 4. DM: She reports good glycemic control 5. HTN: Blood pressure is still borderline elevated, possibly use of nasal spray vasoconstrictors. 6. HyperCa: Asymptomatic and mild. Has follow-up with endocrinologist and the nephrologist. She is not taking diuretics.    Medication Adjustments/Labs and Tests Ordered: Current medicines are reviewed at length with the patient today.  Concerns regarding medicines are outlined above.  Medication changes, Labs and Tests ordered today are listed in the Patient Instructions below. Patient Instructions  Medication Instructions: Dr Sallyanne Kuster has recommended making the following medication changes: 1. OKAY to take Co-Q-10 300 mg daily  Labwork: NONE ORDERED  Testing/Procedures: 1. Echocardiogram in 1 YEAR - Your physician has requested that you have an echocardiogram. Echocardiography is a painless test that uses sound waves to create images of your heart. It provides your doctor with information about the size and shape of your heart and how well your heart's chambers and valves are working. This procedure takes approximately one hour. There are no restrictions for this procedure. This will be performed at our [facility] location - [address].  Follow-up: Dr Sallyanne Kuster recommends that you schedule a follow-up appointment in 1 year. You will receive a reminder letter in the mail two months in advance. If you don't receive a letter, please call our office to schedule the follow-up appointment.  If you need a refill on your cardiac medications before  your next appointment, please call your pharmacy.    Signed, Sanda Klein, MD  09/24/2016 2:07 PM    [facility] [place] [address], [place], [place]  [address] Phone: [phone]; Fax: [phone]

## 2016-09-30 DIAGNOSIS — E210 Primary hyperparathyroidism: Secondary | ICD-10-CM | POA: Diagnosis not present

## 2016-09-30 DIAGNOSIS — E782 Mixed hyperlipidemia: Secondary | ICD-10-CM | POA: Diagnosis not present

## 2016-09-30 DIAGNOSIS — E669 Obesity, unspecified: Secondary | ICD-10-CM | POA: Diagnosis not present

## 2016-09-30 DIAGNOSIS — N183 Chronic kidney disease, stage 3 (moderate): Secondary | ICD-10-CM | POA: Diagnosis not present

## 2016-09-30 DIAGNOSIS — Z6841 Body Mass Index (BMI) 40.0 and over, adult: Secondary | ICD-10-CM | POA: Diagnosis not present

## 2016-09-30 DIAGNOSIS — Z0001 Encounter for general adult medical examination with abnormal findings: Secondary | ICD-10-CM | POA: Diagnosis not present

## 2016-09-30 DIAGNOSIS — Z1389 Encounter for screening for other disorder: Secondary | ICD-10-CM | POA: Diagnosis not present

## 2016-09-30 DIAGNOSIS — E1129 Type 2 diabetes mellitus with other diabetic kidney complication: Secondary | ICD-10-CM | POA: Diagnosis not present

## 2016-10-04 ENCOUNTER — Other Ambulatory Visit: Payer: Self-pay | Admitting: Cardiovascular Disease

## 2016-10-13 DIAGNOSIS — E1165 Type 2 diabetes mellitus with hyperglycemia: Secondary | ICD-10-CM | POA: Diagnosis not present

## 2016-10-13 DIAGNOSIS — H40053 Ocular hypertension, bilateral: Secondary | ICD-10-CM | POA: Diagnosis not present

## 2016-10-27 ENCOUNTER — Ambulatory Visit (INDEPENDENT_AMBULATORY_CARE_PROVIDER_SITE_OTHER): Payer: Medicare Other | Admitting: *Deleted

## 2016-10-27 DIAGNOSIS — Z7901 Long term (current) use of anticoagulants: Secondary | ICD-10-CM

## 2016-10-27 DIAGNOSIS — G2581 Restless legs syndrome: Secondary | ICD-10-CM | POA: Diagnosis not present

## 2016-10-27 DIAGNOSIS — Z5181 Encounter for therapeutic drug level monitoring: Secondary | ICD-10-CM

## 2016-10-27 DIAGNOSIS — Z952 Presence of prosthetic heart valve: Secondary | ICD-10-CM | POA: Diagnosis not present

## 2016-10-27 DIAGNOSIS — G4701 Insomnia due to medical condition: Secondary | ICD-10-CM | POA: Diagnosis not present

## 2016-10-27 DIAGNOSIS — Z79891 Long term (current) use of opiate analgesic: Secondary | ICD-10-CM | POA: Diagnosis not present

## 2016-10-27 DIAGNOSIS — M5416 Radiculopathy, lumbar region: Secondary | ICD-10-CM | POA: Diagnosis not present

## 2016-10-27 LAB — POCT INR: INR: 2.6

## 2016-11-01 ENCOUNTER — Other Ambulatory Visit: Payer: Self-pay | Admitting: Cardiovascular Disease

## 2016-11-09 DIAGNOSIS — Z6841 Body Mass Index (BMI) 40.0 and over, adult: Secondary | ICD-10-CM | POA: Diagnosis not present

## 2016-11-09 DIAGNOSIS — Z1389 Encounter for screening for other disorder: Secondary | ICD-10-CM | POA: Diagnosis not present

## 2016-11-09 DIAGNOSIS — H1013 Acute atopic conjunctivitis, bilateral: Secondary | ICD-10-CM | POA: Diagnosis not present

## 2016-12-15 ENCOUNTER — Ambulatory Visit (INDEPENDENT_AMBULATORY_CARE_PROVIDER_SITE_OTHER): Payer: Medicare Other | Admitting: *Deleted

## 2016-12-15 DIAGNOSIS — Z952 Presence of prosthetic heart valve: Secondary | ICD-10-CM

## 2016-12-15 DIAGNOSIS — Z5181 Encounter for therapeutic drug level monitoring: Secondary | ICD-10-CM | POA: Diagnosis not present

## 2016-12-15 LAB — POCT INR: INR: 2.7

## 2016-12-23 DIAGNOSIS — Z79891 Long term (current) use of opiate analgesic: Secondary | ICD-10-CM | POA: Diagnosis not present

## 2016-12-23 DIAGNOSIS — G2581 Restless legs syndrome: Secondary | ICD-10-CM | POA: Diagnosis not present

## 2016-12-23 DIAGNOSIS — I10 Essential (primary) hypertension: Secondary | ICD-10-CM | POA: Diagnosis not present

## 2016-12-23 DIAGNOSIS — Z7901 Long term (current) use of anticoagulants: Secondary | ICD-10-CM | POA: Diagnosis not present

## 2016-12-23 DIAGNOSIS — G4701 Insomnia due to medical condition: Secondary | ICD-10-CM | POA: Diagnosis not present

## 2016-12-23 DIAGNOSIS — M5416 Radiculopathy, lumbar region: Secondary | ICD-10-CM | POA: Diagnosis not present

## 2016-12-23 DIAGNOSIS — E785 Hyperlipidemia, unspecified: Secondary | ICD-10-CM | POA: Diagnosis not present

## 2017-01-13 DIAGNOSIS — Z6841 Body Mass Index (BMI) 40.0 and over, adult: Secondary | ICD-10-CM | POA: Diagnosis not present

## 2017-01-13 DIAGNOSIS — J069 Acute upper respiratory infection, unspecified: Secondary | ICD-10-CM | POA: Diagnosis not present

## 2017-01-13 DIAGNOSIS — M545 Low back pain: Secondary | ICD-10-CM | POA: Diagnosis not present

## 2017-01-13 DIAGNOSIS — M5136 Other intervertebral disc degeneration, lumbar region: Secondary | ICD-10-CM | POA: Diagnosis not present

## 2017-01-21 DIAGNOSIS — I10 Essential (primary) hypertension: Secondary | ICD-10-CM | POA: Diagnosis not present

## 2017-01-21 DIAGNOSIS — N183 Chronic kidney disease, stage 3 (moderate): Secondary | ICD-10-CM | POA: Diagnosis not present

## 2017-01-21 DIAGNOSIS — E559 Vitamin D deficiency, unspecified: Secondary | ICD-10-CM | POA: Diagnosis not present

## 2017-01-21 DIAGNOSIS — R809 Proteinuria, unspecified: Secondary | ICD-10-CM | POA: Diagnosis not present

## 2017-01-21 DIAGNOSIS — Z79899 Other long term (current) drug therapy: Secondary | ICD-10-CM | POA: Diagnosis not present

## 2017-01-21 DIAGNOSIS — D509 Iron deficiency anemia, unspecified: Secondary | ICD-10-CM | POA: Diagnosis not present

## 2017-01-26 ENCOUNTER — Ambulatory Visit (INDEPENDENT_AMBULATORY_CARE_PROVIDER_SITE_OTHER): Payer: Medicare Other | Admitting: *Deleted

## 2017-01-26 DIAGNOSIS — N183 Chronic kidney disease, stage 3 (moderate): Secondary | ICD-10-CM | POA: Diagnosis not present

## 2017-01-26 DIAGNOSIS — R809 Proteinuria, unspecified: Secondary | ICD-10-CM | POA: Diagnosis not present

## 2017-01-26 DIAGNOSIS — I509 Heart failure, unspecified: Secondary | ICD-10-CM | POA: Diagnosis not present

## 2017-01-26 DIAGNOSIS — Z5181 Encounter for therapeutic drug level monitoring: Secondary | ICD-10-CM | POA: Diagnosis not present

## 2017-01-26 DIAGNOSIS — Z952 Presence of prosthetic heart valve: Secondary | ICD-10-CM | POA: Diagnosis not present

## 2017-01-26 DIAGNOSIS — E1129 Type 2 diabetes mellitus with other diabetic kidney complication: Secondary | ICD-10-CM | POA: Diagnosis not present

## 2017-01-26 DIAGNOSIS — I10 Essential (primary) hypertension: Secondary | ICD-10-CM | POA: Diagnosis not present

## 2017-01-26 LAB — POCT INR: INR: 2.4

## 2017-02-17 DIAGNOSIS — I10 Essential (primary) hypertension: Secondary | ICD-10-CM | POA: Diagnosis not present

## 2017-02-17 DIAGNOSIS — E785 Hyperlipidemia, unspecified: Secondary | ICD-10-CM | POA: Diagnosis not present

## 2017-02-17 DIAGNOSIS — G2581 Restless legs syndrome: Secondary | ICD-10-CM | POA: Diagnosis not present

## 2017-02-17 DIAGNOSIS — Z79891 Long term (current) use of opiate analgesic: Secondary | ICD-10-CM | POA: Diagnosis not present

## 2017-02-17 DIAGNOSIS — G4701 Insomnia due to medical condition: Secondary | ICD-10-CM | POA: Diagnosis not present

## 2017-02-17 DIAGNOSIS — M5416 Radiculopathy, lumbar region: Secondary | ICD-10-CM | POA: Diagnosis not present

## 2017-03-09 ENCOUNTER — Ambulatory Visit (INDEPENDENT_AMBULATORY_CARE_PROVIDER_SITE_OTHER): Payer: Medicare Other | Admitting: *Deleted

## 2017-03-09 DIAGNOSIS — Z5181 Encounter for therapeutic drug level monitoring: Secondary | ICD-10-CM | POA: Diagnosis not present

## 2017-03-09 DIAGNOSIS — Z952 Presence of prosthetic heart valve: Secondary | ICD-10-CM

## 2017-03-09 LAB — POCT INR: INR: 4

## 2017-03-15 ENCOUNTER — Other Ambulatory Visit: Payer: Self-pay | Admitting: Cardiovascular Disease

## 2017-03-30 ENCOUNTER — Ambulatory Visit (INDEPENDENT_AMBULATORY_CARE_PROVIDER_SITE_OTHER): Payer: Medicare Other | Admitting: *Deleted

## 2017-03-30 DIAGNOSIS — Z5181 Encounter for therapeutic drug level monitoring: Secondary | ICD-10-CM | POA: Diagnosis not present

## 2017-03-30 DIAGNOSIS — Z952 Presence of prosthetic heart valve: Secondary | ICD-10-CM

## 2017-03-30 LAB — POCT INR: INR: 2.6

## 2017-04-04 DIAGNOSIS — G4700 Insomnia, unspecified: Secondary | ICD-10-CM | POA: Diagnosis not present

## 2017-04-04 DIAGNOSIS — I10 Essential (primary) hypertension: Secondary | ICD-10-CM | POA: Diagnosis not present

## 2017-04-04 DIAGNOSIS — Z1389 Encounter for screening for other disorder: Secondary | ICD-10-CM | POA: Diagnosis not present

## 2017-04-04 DIAGNOSIS — Z6841 Body Mass Index (BMI) 40.0 and over, adult: Secondary | ICD-10-CM | POA: Diagnosis not present

## 2017-04-04 DIAGNOSIS — F419 Anxiety disorder, unspecified: Secondary | ICD-10-CM | POA: Diagnosis not present

## 2017-04-04 DIAGNOSIS — E210 Primary hyperparathyroidism: Secondary | ICD-10-CM | POA: Diagnosis not present

## 2017-04-04 DIAGNOSIS — E782 Mixed hyperlipidemia: Secondary | ICD-10-CM | POA: Diagnosis not present

## 2017-04-04 DIAGNOSIS — E1129 Type 2 diabetes mellitus with other diabetic kidney complication: Secondary | ICD-10-CM | POA: Diagnosis not present

## 2017-04-18 DIAGNOSIS — G4733 Obstructive sleep apnea (adult) (pediatric): Secondary | ICD-10-CM | POA: Diagnosis not present

## 2017-04-18 DIAGNOSIS — G2581 Restless legs syndrome: Secondary | ICD-10-CM | POA: Diagnosis not present

## 2017-04-18 DIAGNOSIS — M5416 Radiculopathy, lumbar region: Secondary | ICD-10-CM | POA: Diagnosis not present

## 2017-04-18 DIAGNOSIS — Z79891 Long term (current) use of opiate analgesic: Secondary | ICD-10-CM | POA: Diagnosis not present

## 2017-04-18 DIAGNOSIS — G4701 Insomnia due to medical condition: Secondary | ICD-10-CM | POA: Diagnosis not present

## 2017-04-25 DIAGNOSIS — H40023 Open angle with borderline findings, high risk, bilateral: Secondary | ICD-10-CM | POA: Diagnosis not present

## 2017-04-27 ENCOUNTER — Ambulatory Visit (INDEPENDENT_AMBULATORY_CARE_PROVIDER_SITE_OTHER): Payer: Medicare Other | Admitting: *Deleted

## 2017-04-27 DIAGNOSIS — Z952 Presence of prosthetic heart valve: Secondary | ICD-10-CM

## 2017-04-27 DIAGNOSIS — Z5181 Encounter for therapeutic drug level monitoring: Secondary | ICD-10-CM | POA: Diagnosis not present

## 2017-04-27 LAB — POCT INR: INR: 3.7

## 2017-05-11 DIAGNOSIS — G4700 Insomnia, unspecified: Secondary | ICD-10-CM | POA: Diagnosis not present

## 2017-05-11 DIAGNOSIS — Z6841 Body Mass Index (BMI) 40.0 and over, adult: Secondary | ICD-10-CM | POA: Diagnosis not present

## 2017-05-11 DIAGNOSIS — I10 Essential (primary) hypertension: Secondary | ICD-10-CM | POA: Diagnosis not present

## 2017-05-11 DIAGNOSIS — E669 Obesity, unspecified: Secondary | ICD-10-CM | POA: Diagnosis not present

## 2017-05-18 IMAGING — MR MR KNEE*R* W/O CM
4 of 5 series · 19 of 40 positions shown · non-contrast
Comparison: Plain films of the right knee 01/12/2016

CLINICAL DATA: Right knee pain, redness and swelling beginning 1
day ago. No known injury. Initial encounter.

EXAM:
MRI OF THE RIGHT KNEE WITHOUT CONTRAST
TECHNIQUE: Multiplanar, multisequence MR imaging of the knee was performed. No
intravenous contrast was administered.

[Series 3: PD fat-sat · axial · 4.0mm · 0.31mm/px · z∈[-101,+19]mm · 8 of 25 slices shown (1 of 3)]
[im 1/25]
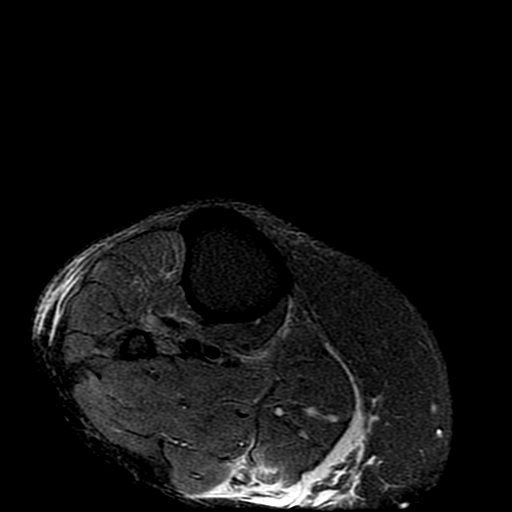
[im 4/25]
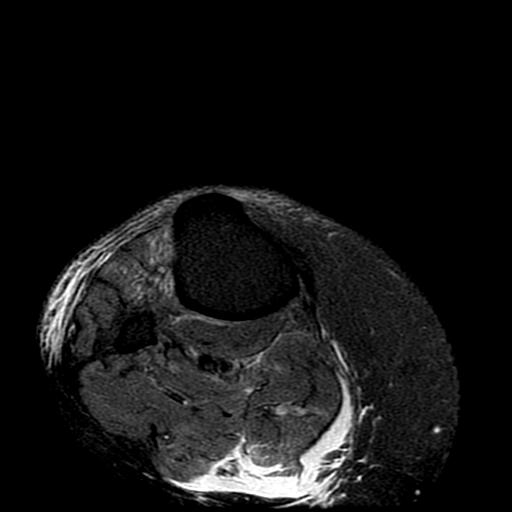
[im 7/25]
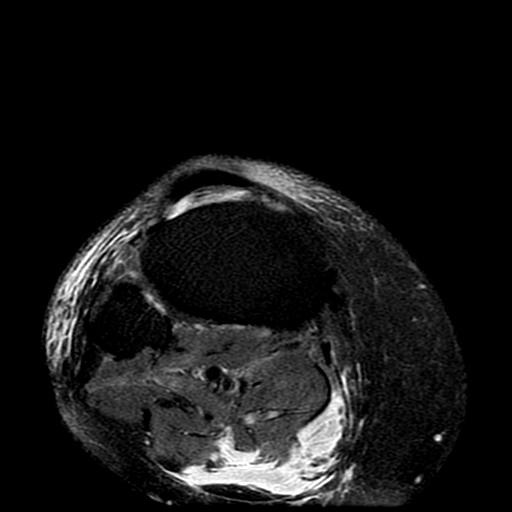
[im 11/25]
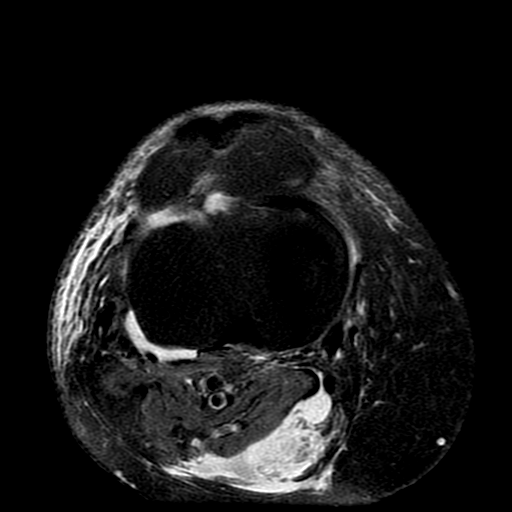
[im 14/25]
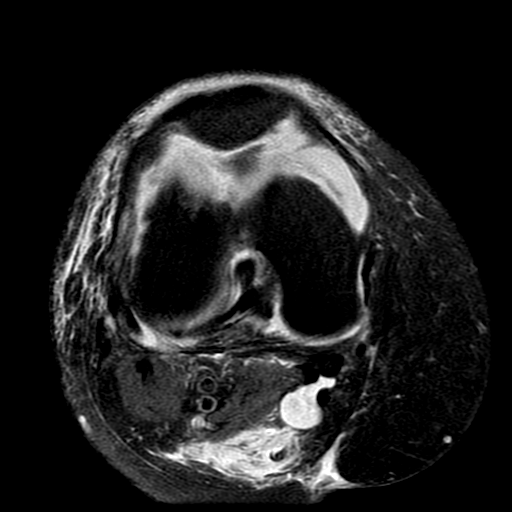
[im 18/25]
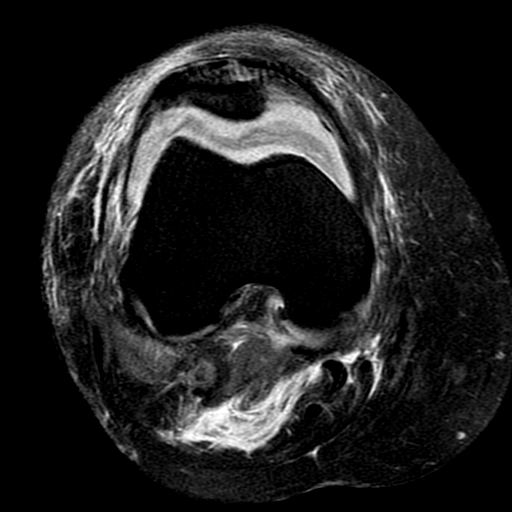
[im 21/25]
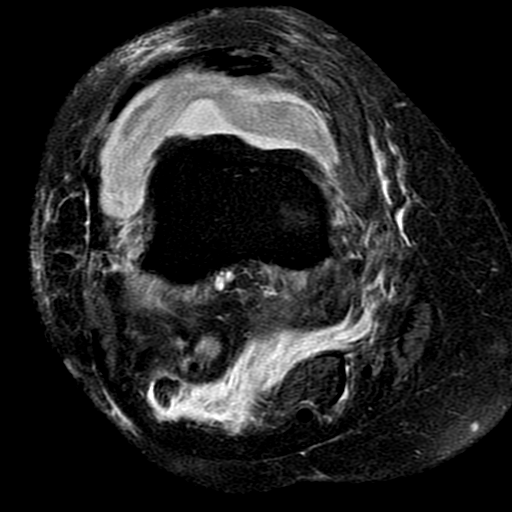
[im 25/25]
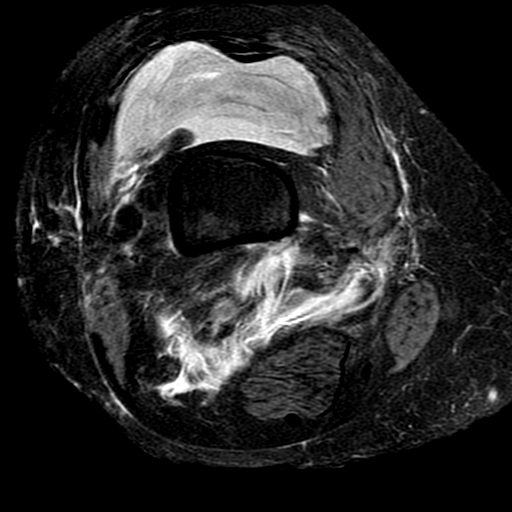

[Series 5: PD fat-sat · coronal · 4.0mm · 0.31mm/px · 5 of 25 slices shown (2 of 3)]
[im 1/25]
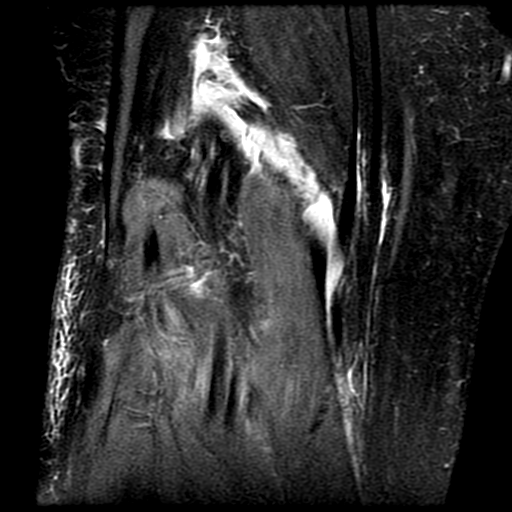
[im 4/25]
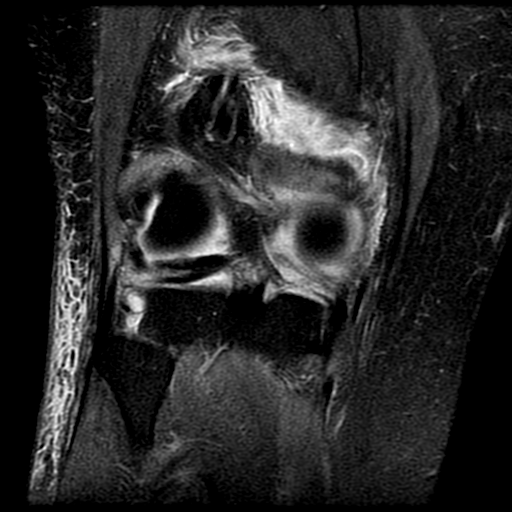
[im 7/25]
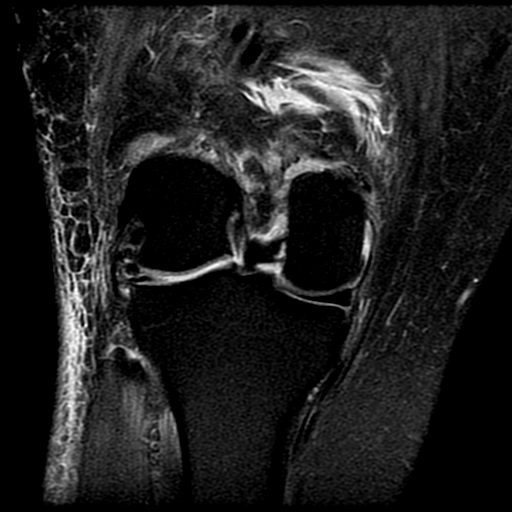
[im 14/25]
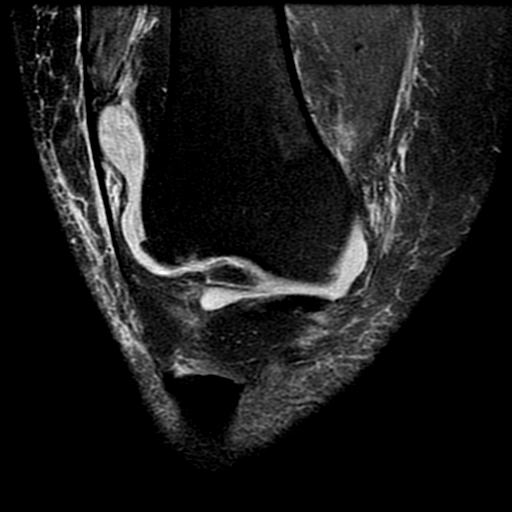
[im 21/25]
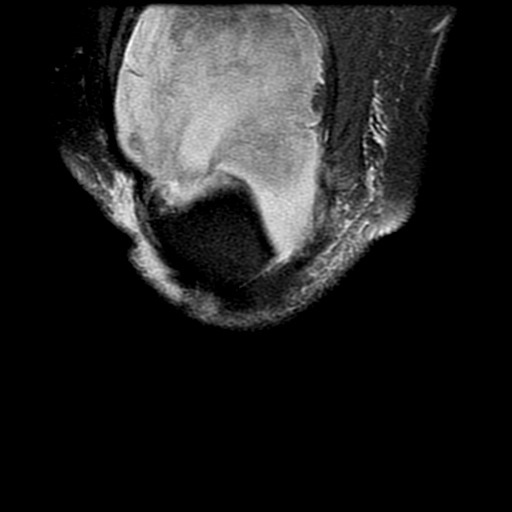

[Series 6: T2 fat-sat · coronal · 4.0mm · 0.31mm/px · 3 of 25 slices shown]
[im 4/25]
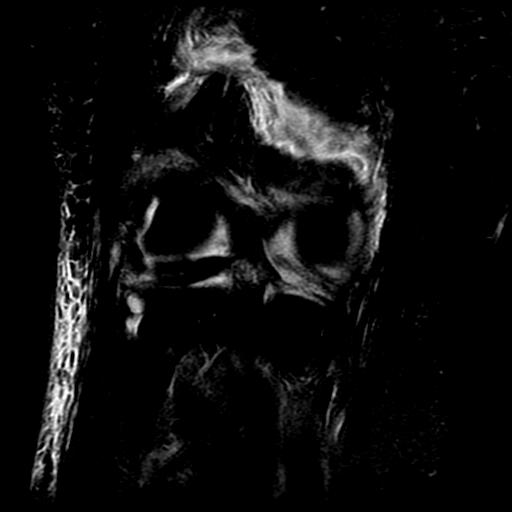
[im 14/25]
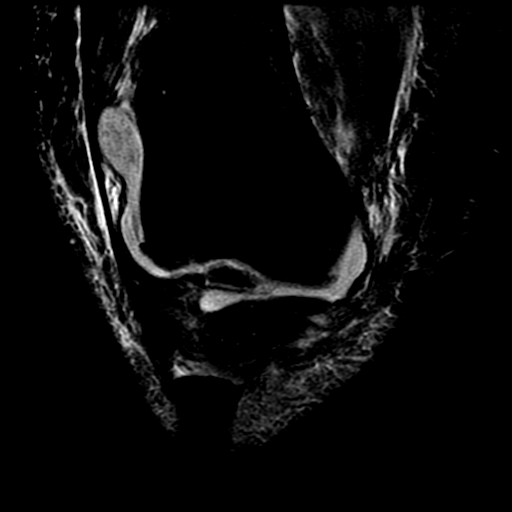
[im 21/25]
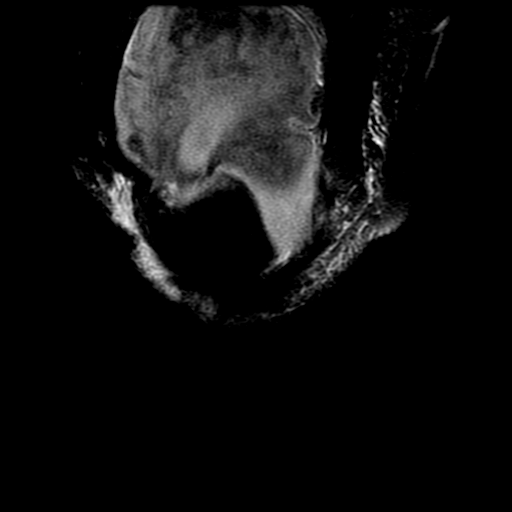

[Series 7: PD fat-sat · sagittal · 4.0mm · 0.31mm/px · 3 of 25 slices shown (3 of 3)]
[im 4/25]
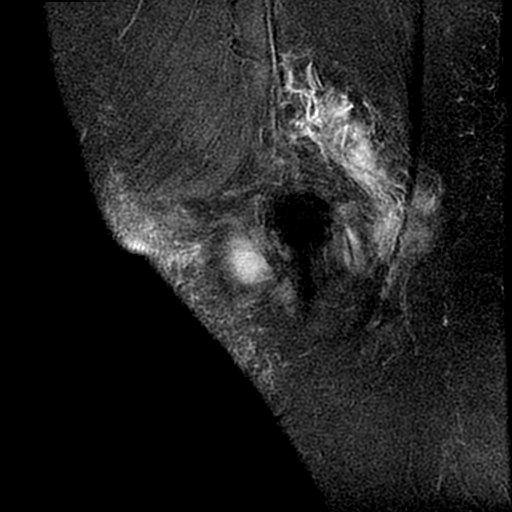
[im 14/25]
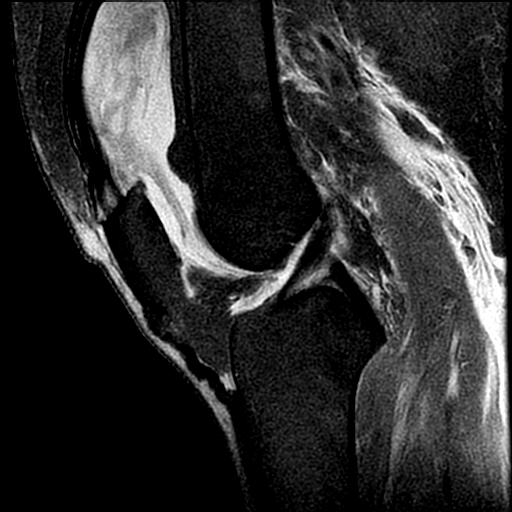
[im 21/25]
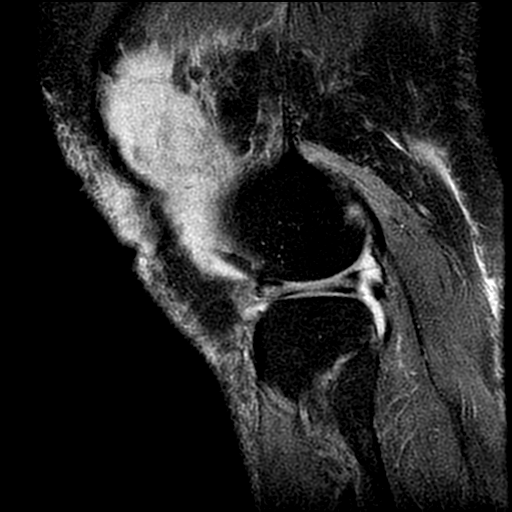

[19 of 40 positions shown; findings below may reference images not displayed]

FINDINGS: MENISCI

Medial meniscus:  Intact.

Lateral meniscus: Horizontal tear is seen from the periphery of the
posterior horn of the lateral meniscus, throughout the body and into
the anterior horn.

LIGAMENTS

Cruciates:  Intact.

Collaterals:  Intact.

CARTILAGE

Patellofemoral:  Appears nearly completely denuded throughout.

Medial:  Unremarkable.

Lateral:  Unremarkable.

Joint:  A very large knee joint effusion is seen.

Popliteal Fossa: Baker's cyst has ruptured with fluid dissecting
along the gastrocnemius.

Extensor Mechanism:  Intact.

Bones: No fracture, erosion or worrisome marrow lesion. Mild
subchondral cyst formation in the trochlea is noted.
IMPRESSION: Horizontal tear that extends from the posterior horn of the lateral
meniscus into the anterior horn.

Very large knee joint effusion. Cause for the effusion is not
identified.

Patellofemoral osteoarthritis.

Ruptured Baker's cyst with fluid dissecting along the gastrocnemius.

## 2017-05-19 ENCOUNTER — Ambulatory Visit (INDEPENDENT_AMBULATORY_CARE_PROVIDER_SITE_OTHER): Payer: Medicare Other | Admitting: *Deleted

## 2017-05-19 DIAGNOSIS — Z952 Presence of prosthetic heart valve: Secondary | ICD-10-CM

## 2017-05-19 DIAGNOSIS — Z5181 Encounter for therapeutic drug level monitoring: Secondary | ICD-10-CM

## 2017-05-19 LAB — POCT INR: INR: 2.9

## 2017-05-31 DIAGNOSIS — Z23 Encounter for immunization: Secondary | ICD-10-CM | POA: Diagnosis not present

## 2017-06-08 ENCOUNTER — Ambulatory Visit (INDEPENDENT_AMBULATORY_CARE_PROVIDER_SITE_OTHER): Payer: Medicare Other | Admitting: *Deleted

## 2017-06-08 DIAGNOSIS — Z5181 Encounter for therapeutic drug level monitoring: Secondary | ICD-10-CM | POA: Diagnosis not present

## 2017-06-08 DIAGNOSIS — Z6841 Body Mass Index (BMI) 40.0 and over, adult: Secondary | ICD-10-CM | POA: Diagnosis not present

## 2017-06-08 DIAGNOSIS — Z952 Presence of prosthetic heart valve: Secondary | ICD-10-CM

## 2017-06-08 DIAGNOSIS — H1012 Acute atopic conjunctivitis, left eye: Secondary | ICD-10-CM | POA: Diagnosis not present

## 2017-06-08 DIAGNOSIS — L298 Other pruritus: Secondary | ICD-10-CM | POA: Diagnosis not present

## 2017-06-08 LAB — POCT INR: INR: 3.8

## 2017-06-27 ENCOUNTER — Ambulatory Visit (INDEPENDENT_AMBULATORY_CARE_PROVIDER_SITE_OTHER): Payer: Medicare Other | Admitting: *Deleted

## 2017-06-27 DIAGNOSIS — Z9889 Other specified postprocedural states: Secondary | ICD-10-CM

## 2017-06-27 DIAGNOSIS — Z952 Presence of prosthetic heart valve: Secondary | ICD-10-CM

## 2017-06-27 DIAGNOSIS — Z8679 Personal history of other diseases of the circulatory system: Secondary | ICD-10-CM | POA: Diagnosis not present

## 2017-06-27 DIAGNOSIS — Z5181 Encounter for therapeutic drug level monitoring: Secondary | ICD-10-CM

## 2017-06-27 LAB — POCT INR: INR: 1.9

## 2017-07-14 ENCOUNTER — Other Ambulatory Visit (HOSPITAL_COMMUNITY): Payer: Self-pay | Admitting: Internal Medicine

## 2017-07-14 DIAGNOSIS — Z1231 Encounter for screening mammogram for malignant neoplasm of breast: Secondary | ICD-10-CM

## 2017-07-14 DIAGNOSIS — G2581 Restless legs syndrome: Secondary | ICD-10-CM | POA: Diagnosis not present

## 2017-07-14 DIAGNOSIS — M545 Low back pain: Secondary | ICD-10-CM | POA: Diagnosis not present

## 2017-07-14 DIAGNOSIS — M5416 Radiculopathy, lumbar region: Secondary | ICD-10-CM | POA: Diagnosis not present

## 2017-07-14 DIAGNOSIS — G4701 Insomnia due to medical condition: Secondary | ICD-10-CM | POA: Diagnosis not present

## 2017-07-18 ENCOUNTER — Ambulatory Visit (INDEPENDENT_AMBULATORY_CARE_PROVIDER_SITE_OTHER): Payer: Medicare Other | Admitting: *Deleted

## 2017-07-18 DIAGNOSIS — Z5181 Encounter for therapeutic drug level monitoring: Secondary | ICD-10-CM | POA: Diagnosis not present

## 2017-07-18 DIAGNOSIS — Z952 Presence of prosthetic heart valve: Secondary | ICD-10-CM | POA: Diagnosis not present

## 2017-07-18 DIAGNOSIS — I350 Nonrheumatic aortic (valve) stenosis: Secondary | ICD-10-CM | POA: Diagnosis not present

## 2017-07-18 LAB — POCT INR: INR: 2.9

## 2017-07-25 ENCOUNTER — Ambulatory Visit (HOSPITAL_COMMUNITY)
Admission: RE | Admit: 2017-07-25 | Discharge: 2017-07-25 | Disposition: A | Discharge status: Home / Self Care | Payer: Medicare Other | Source: Ambulatory Visit | Attending: Internal Medicine | Admitting: Internal Medicine

## 2017-07-25 DIAGNOSIS — Z1231 Encounter for screening mammogram for malignant neoplasm of breast: Secondary | ICD-10-CM | POA: Insufficient documentation

## 2017-07-29 DIAGNOSIS — I10 Essential (primary) hypertension: Secondary | ICD-10-CM | POA: Diagnosis not present

## 2017-07-29 DIAGNOSIS — N183 Chronic kidney disease, stage 3 (moderate): Secondary | ICD-10-CM | POA: Diagnosis not present

## 2017-07-29 DIAGNOSIS — Z79899 Other long term (current) drug therapy: Secondary | ICD-10-CM | POA: Diagnosis not present

## 2017-07-29 DIAGNOSIS — E559 Vitamin D deficiency, unspecified: Secondary | ICD-10-CM | POA: Diagnosis not present

## 2017-07-29 DIAGNOSIS — D509 Iron deficiency anemia, unspecified: Secondary | ICD-10-CM | POA: Diagnosis not present

## 2017-07-29 DIAGNOSIS — R809 Proteinuria, unspecified: Secondary | ICD-10-CM | POA: Diagnosis not present

## 2017-07-31 ENCOUNTER — Other Ambulatory Visit: Payer: Self-pay | Admitting: Cardiovascular Disease

## 2017-08-03 DIAGNOSIS — N250 Renal osteodystrophy: Secondary | ICD-10-CM | POA: Diagnosis not present

## 2017-08-03 DIAGNOSIS — I10 Essential (primary) hypertension: Secondary | ICD-10-CM | POA: Diagnosis not present

## 2017-08-03 DIAGNOSIS — N183 Chronic kidney disease, stage 3 (moderate): Secondary | ICD-10-CM | POA: Diagnosis not present

## 2017-08-03 DIAGNOSIS — E1129 Type 2 diabetes mellitus with other diabetic kidney complication: Secondary | ICD-10-CM | POA: Diagnosis not present

## 2017-08-03 DIAGNOSIS — I509 Heart failure, unspecified: Secondary | ICD-10-CM | POA: Diagnosis not present

## 2017-08-03 DIAGNOSIS — R809 Proteinuria, unspecified: Secondary | ICD-10-CM | POA: Diagnosis not present

## 2017-08-15 ENCOUNTER — Ambulatory Visit (INDEPENDENT_AMBULATORY_CARE_PROVIDER_SITE_OTHER): Payer: Medicare Other | Admitting: *Deleted

## 2017-08-15 DIAGNOSIS — Z952 Presence of prosthetic heart valve: Secondary | ICD-10-CM | POA: Diagnosis not present

## 2017-08-15 DIAGNOSIS — Z5181 Encounter for therapeutic drug level monitoring: Secondary | ICD-10-CM | POA: Diagnosis not present

## 2017-08-15 LAB — POCT INR: INR: 2

## 2017-09-14 ENCOUNTER — Ambulatory Visit (INDEPENDENT_AMBULATORY_CARE_PROVIDER_SITE_OTHER): Payer: Medicare Other | Admitting: *Deleted

## 2017-09-14 DIAGNOSIS — Z952 Presence of prosthetic heart valve: Secondary | ICD-10-CM

## 2017-09-14 DIAGNOSIS — Z5181 Encounter for therapeutic drug level monitoring: Secondary | ICD-10-CM | POA: Diagnosis not present

## 2017-09-14 LAB — POCT INR: INR: 2.7

## 2017-09-29 ENCOUNTER — Other Ambulatory Visit: Payer: Self-pay | Admitting: Cardiovascular Disease

## 2017-10-03 DIAGNOSIS — F419 Anxiety disorder, unspecified: Secondary | ICD-10-CM | POA: Diagnosis not present

## 2017-10-03 DIAGNOSIS — Z1389 Encounter for screening for other disorder: Secondary | ICD-10-CM | POA: Diagnosis not present

## 2017-10-03 DIAGNOSIS — E119 Type 2 diabetes mellitus without complications: Secondary | ICD-10-CM | POA: Diagnosis not present

## 2017-10-03 DIAGNOSIS — Z6841 Body Mass Index (BMI) 40.0 and over, adult: Secondary | ICD-10-CM | POA: Diagnosis not present

## 2017-10-03 DIAGNOSIS — G4709 Other insomnia: Secondary | ICD-10-CM | POA: Diagnosis not present

## 2017-10-13 ENCOUNTER — Other Ambulatory Visit: Payer: Self-pay

## 2017-10-13 ENCOUNTER — Ambulatory Visit (HOSPITAL_COMMUNITY): Payer: Medicare Other | Attending: Cardiology

## 2017-10-13 ENCOUNTER — Encounter: Payer: Self-pay | Admitting: Cardiovascular Disease

## 2017-10-13 ENCOUNTER — Ambulatory Visit (INDEPENDENT_AMBULATORY_CARE_PROVIDER_SITE_OTHER): Payer: Medicare Other | Admitting: Cardiovascular Disease

## 2017-10-13 VITALS — BP 150/86 | HR 78 | Ht 62.0 in | Wt 257.0 lb

## 2017-10-13 DIAGNOSIS — E1169 Type 2 diabetes mellitus with other specified complication: Secondary | ICD-10-CM | POA: Diagnosis not present

## 2017-10-13 DIAGNOSIS — Z6841 Body Mass Index (BMI) 40.0 and over, adult: Secondary | ICD-10-CM | POA: Diagnosis not present

## 2017-10-13 DIAGNOSIS — E785 Hyperlipidemia, unspecified: Secondary | ICD-10-CM | POA: Diagnosis not present

## 2017-10-13 DIAGNOSIS — N189 Chronic kidney disease, unspecified: Secondary | ICD-10-CM | POA: Insufficient documentation

## 2017-10-13 DIAGNOSIS — I10 Essential (primary) hypertension: Secondary | ICD-10-CM

## 2017-10-13 DIAGNOSIS — I080 Rheumatic disorders of both mitral and aortic valves: Secondary | ICD-10-CM | POA: Diagnosis not present

## 2017-10-13 DIAGNOSIS — I1310 Hypertensive heart and chronic kidney disease without heart failure, with stage 1 through stage 4 chronic kidney disease, or unspecified chronic kidney disease: Secondary | ICD-10-CM | POA: Diagnosis not present

## 2017-10-13 DIAGNOSIS — I2510 Atherosclerotic heart disease of native coronary artery without angina pectoris: Secondary | ICD-10-CM | POA: Diagnosis not present

## 2017-10-13 DIAGNOSIS — Z952 Presence of prosthetic heart valve: Secondary | ICD-10-CM

## 2017-10-13 DIAGNOSIS — E669 Obesity, unspecified: Secondary | ICD-10-CM | POA: Insufficient documentation

## 2017-10-13 DIAGNOSIS — I359 Nonrheumatic aortic valve disorder, unspecified: Secondary | ICD-10-CM | POA: Diagnosis present

## 2017-10-13 DIAGNOSIS — E1122 Type 2 diabetes mellitus with diabetic chronic kidney disease: Secondary | ICD-10-CM | POA: Insufficient documentation

## 2017-10-13 NOTE — Progress Notes (Signed)
Cardiology Office Note    Date:  10/14/2017   ID:  [name], DOB February 26, 1951, MRN [id number]  PCP:  Redmond School, MD  Cardiologist:   Sanda Klein, MD   Chief complaint: Follow-up mechanical aortic valve replacement   History of Present Illness:  [name] is a 67 y.o. female here for follow-up of mechanical aortic valve replacement and ascending aortic aneurysm repair Deneen Harts procedure, 2011 , 21 mm valve) performed for bicuspid aortic valve with critical aortic stenosis. Additional problems include hypertension, diabetes mellitus, mixed hyperlipidemia in the setting of morbid obesity  She has no cardiac complaints.  She is seeing Dr.Befekadu (for mild hypercalcemia and mild renal insufficiency) and Dr. Gerarda Fraction in [facility].  She just underwent a follow-up echocardiogram that shows normal function of her aortic valve prosthesis.  The gradients are slightly higher than they were at her previous echocardiogram 5 years ago but remained within the normal range for a 21 mm mechanical valve.  She has not had any bleeding problems and she is currently within therapeutic range with her anticoagulation.  She is followed at the [facility] and is very happy with the care she received there..  The patient specifically denies any chest pain at rest exertion, dyspnea at rest or with exertion, orthopnea, paroxysmal nocturnal dyspnea, syncope, palpitations, focal neurological deficits, intermittent claudication, lower extremity edema, unexplained weight gain, cough, hemoptysis or wheezing.   Past Medical History:  Diagnosis Date  . AI (aortic insufficiency)   . Arthritis   . AS (aortic stenosis) 09/18/2012   2D Echo EF 55%-60%  . Chronic kidney disease    renal insuffcentily  . Coronary artery disease   . Cough   . Diabetes mellitus ([place])   . Gout    right foot  . Hyperlipidemia   . Hypertension    dr Donetta Potts  . Obese   . PONV  (postoperative nausea and vomiting)    with early surgeries , none recent  . Renal insufficiency   . Tricuspid regurgitation     Past Surgical History:  Procedure Laterality Date  . AORTIC VALVE REPLACEMENT  04/01/2010   21-mm Sr Jude Regent ;mechanical valve   . BACK SURGERY    . broken arm     wire in lt elbow  . CARDIAC CATHETERIZATION  02/09/2010  . FRACTURE SURGERY    . GANGLION CYST EXCISION Left 01/08/2016   Procedure: LEFT WRIST EXCISION OF VOLAR CYST, EXCISION OF MUCOID TUMOR;  Surgeon: Daryll Brod, MD;  Location: [facility];  Service: Orthopedics;  Laterality: Left;  . LUMBAR LAMINECTOMY/DECOMPRESSION MICRODISCECTOMY N/A 02/28/2013   Procedure: LUMBAR LAMINECTOMY/DECOMPRESSION MICRODISCECTOMY 2 LEVELS;  Surgeon: Ophelia Charter, MD;  Location: [facility];  Service: Neurosurgery;  Laterality: N/A;  Lumbar three-four and Lumbar four-five laminectomies  . MASS EXCISION Left 01/08/2016   Procedure: EXCISION MUCOID TUMOR LEFT INDEX FINGER DISTAL INTERPHALANGEAL JOINT;  Surgeon: Daryll Brod, MD;  Location: [facility];  Service: Orthopedics;  Laterality: Left;  . replacement  of ascending aortic aneurysm  04/01/2010   28-mm supracoronary Hemashield tube graft using deep hypothermic circulatory arrest    Current Medications: Outpatient Medications Prior to Visit  Medication Sig Dispense Refill  . allopurinol (ZYLOPRIM) 100 MG tablet Take 100 mg by mouth daily.    Marland Kitchen ALPRAZolam (XANAX) 0.5 MG tablet Take 0.5 mg by mouth at bedtime as needed for sleep.     Marland Kitchen b complex vitamins tablet Take 1  tablet by mouth daily.    . Biotin 10000 MCG TABS Take 2,000 mg by mouth daily at 3 pm.    . cetirizine (ZYRTEC) 10 MG tablet Take 10 mg by mouth daily.    Marland Kitchen gabapentin (NEURONTIN) 300 MG capsule Take 300 mg by mouth daily.    Marland Kitchen glimepiride (AMARYL) 2 MG tablet Take 2 mg by mouth daily before breakfast.    . lisinopril (PRINIVIL,ZESTRIL) 10 MG tablet Take 10 mg by  mouth daily.    . metoprolol tartrate (LOPRESSOR) 50 MG tablet TAKE ONE-HALF TABLET BY MOUTH TWICE DAILY 30 tablet 11  . Multiple Vitamin (MULTIVITAMIN WITH MINERALS) TABS Take 1 tablet by mouth daily.    . multivitamin-lutein (OCUVITE-LUTEIN) CAPS Take 1 capsule by mouth daily.    . Omega-3 Fatty Acids (FISH OIL) 1000 MG CAPS Take 1,000 mg by mouth daily.    Marland Kitchen oxyCODONE-acetaminophen (PERCOCET/ROXICET) 5-325 MG tablet Take 1 tablet by mouth every 4 (four) hours as needed. 20 tablet 0  . pravastatin (PRAVACHOL) 80 MG tablet Take 80 mg by mouth daily.    Marland Kitchen warfarin (COUMADIN) 5 MG tablet TAKE 1 TO 1 & 1/2 (ONE TO ONE & ONE-HALF) TABLETS BY MOUTH ONCE DAILY AS DIRECTED BY  COUMADIN  CLINIC 40 tablet 3  . zolpidem (AMBIEN) 10 MG tablet Take 10 mg by mouth at bedtime.     . cholecalciferol (VITAMIN D) 1000 units tablet Take 1,500 Units by mouth 3 (three) times a week.    . ferrous sulfate 325 (65 FE) MG tablet Take 325 mg by mouth 3 (three) times a week.     . phentermine 37.5 MG capsule Take 37.5 mg by mouth every morning.     No facility-administered medications prior to visit.      Allergies:   Morphine and related   Social History   Socioeconomic History  . Marital status: Married    Spouse name: None  . Number of children: None  . Years of education: None  . Highest education level: None  Social Needs  . Financial resource strain: None  . Food insecurity - worry: None  . Food insecurity - inability: None  . Transportation needs - medical: None  . Transportation needs - non-medical: None  Occupational History  . None  Tobacco Use  . Smoking status: Never Smoker  . Smokeless tobacco: Never Used  Substance and Sexual Activity  . Alcohol use: No  . Drug use: No  . Sexual activity: None  Other Topics Concern  . None  Social History Narrative  . None     Family History:  The patient's family history includes CAD in her unknown relative; Cancer - Lung in her maternal  grandfather; Cancer - Prostate in her father; Diabetes in her mother and unknown relative; Hypertension in her unknown relative.   ROS:   Please see the history of present illness.    ROS All other systems reviewed and are negative.   PHYSICAL EXAM:   VS:  BP (!) 150/86 (BP Location: Left Arm, Patient Position: Sitting, Cuff Size: Large)   Pulse 78   Ht _0  (1.575 m)   Wt 257 lb (116.6 kg)   BMI 47.01 kg/m     General: Alert, oriented x3, no distress, morbidly obese Head: no evidence of trauma, PERRL, EOMI, no exophtalmos or lid lag, no myxedema, no xanthelasma; normal ears, nose and oropharynx Neck: normal jugular venous pulsations and no hepatojugular reflux; brisk carotid pulses without delay and  no carotid bruits Chest: clear to auscultation, no signs of consolidation by percussion or palpation, normal fremitus, symmetrical and full respiratory excursions Cardiovascular: normal position and quality of the apical impulse, regular rhythm, normal first and second heart sounds with crisp mechanical valve clicks, 2/6 early peaking systolic ejection murmur in the aortic focus no diastolic murmurs, rubs or gallops Abdomen: no tenderness or distention, no masses by palpation, no abnormal pulsatility or arterial bruits, normal bowel sounds, no hepatosplenomegaly Extremities: no clubbing, cyanosis or edema; 2+ radial, ulnar and brachial pulses bilaterally; 2+ right femoral, posterior tibial and dorsalis pedis pulses; 2+ left femoral, posterior tibial and dorsalis pedis pulses; no subclavian or femoral bruits Neurological: grossly nonfocal Psych: Normal mood and affect   Wt Readings from Last 3 Encounters:  10/13/17 257 lb (116.6 kg)  09/24/16 252 lb (114.3 kg)  07/06/16 246 lb 6.4 oz (111.8 kg)      Studies/Labs Reviewed:   ECHO 10/13/2017:   - Left ventricle: The cavity size was normal. There was moderate concentric hypertrophy. Systolic function was normal. The   estimated  ejection fraction was in the range of 55% to 60%. Wall motion was normal; there were no regional wall motion   abnormalities. There was an increased relative contribution of atrial contraction to ventricular filling. Doppler parameters are   consistent with abnormal left ventricular relaxation (grade 1 diastolic dysfunction). - Aortic valve: There was very mild stenosis. Mean gradient (S): 13 mm Hg. - Aorta: s/p Bentall procedures with normal aortic root and ascending aortic dimensions. - Mitral valve: Calcified annulus. - Left atrium: The atrium was mildly dilated. - Right ventricle: The cavity size was mildly dilated. Wall thickness was normal. - Pulmonary arteries: PA peak pressure: 39 mm Hg (S).  Impressions:  - Moderate LVH, normal LVF, mild TR, MAC, mild TR, mild PHT with PASP estimated at 71mHg, G1DD, mild RVE, S/P Bentall with normal aortic root and ascending aortic dimensions, calcified poorly visualized AV with very mild AS. The right ventricular systolic pressure was increased consistent with mild pulmonary hypertension.  EKG:  EKG is ordered today.  The ekg ordered today shows normal sinus rhythm with chronic T wave inversion in leads I and aVL, suspect left ventricular hypertrophy with voltage masked by obesity.  Recent Labs: July 30, 2017 creatinine 1.17, BUN 20 (estimated GFR 48, calcium 10.2, potassium 4.9 Mildly increased intact PTH 67, mildly decreased vitamin D 24.7 Normal iron studies, hemoglobin 13.6.  Ferritin is high at 340 Lipid Panel February 2017 total cholesterol 269, triglycerides 223, HDL 57, LDL 167  ASSESSMENT:    1. Hx of mechanical aortic valve replacement   2. Dyslipidemia      PLAN:  In order of problems listed above:  1. S/P AVR: Doing well clinically and with normal exam.  Prosthetic valve gradients remain in expected range, although they have increased slightly from her baseline 5 years ago.  Asymptomatic.  It is more likely that her  pulmonary hypertension is related to her morbid obesity than to valvular heart disease. 2. Obesity: Encouraged her again to increase physical activity and try to limit caloric intake. 3. HLP: She reports that she had a lipid profile with her primary care physician.  We will try to get those results. 4. DM: Per her report last hemoglobin A1c was within target range. 5. HTN: Borderline elevated blood pressure.  She reports that when she saw her primary care physician her blood pressure was great.  She states that it is always  high when she goes to see specialists.  No changes are made to medications today.  We will try to get records.   6. HyperCa: Asymptomatic and mild, elevated intact PTH and low vitamin D level.  Avoid prescribing diuretics without consulting her nephrologist.    Medication Adjustments/Labs and Tests Ordered: Current medicines are reviewed at length with the patient today.  Concerns regarding medicines are outlined above.  Medication changes, Labs and Tests ordered today are listed in the Patient Instructions below. Patient Instructions  Dr Sallyanne Kuster recommends that you continue on your current medications as directed. Please refer to the Current Medication list given to you today.  Your physician recommends that you return for lab work at your convenience - FASTING.  Dr Sallyanne Kuster recommends that you schedule a follow-up appointment in 12 months. You will receive a reminder letter in the mail two months in advance. If you don't receive a letter, please call our office to schedule the follow-up appointment.  If you need a refill on your cardiac medications before your next appointment, please call your pharmacy.    Signed, Sanda Klein, MD  10/14/2017 6:52 PM    [address], [place], [place]  [address] Phone: [phone]; Fax: [phone]

## 2017-10-13 NOTE — Patient Instructions (Signed)
Dr Croitoru recommends that you continue on your current medications as directed. Please refer to the Current Medication list given to you today.  Your physician recommends that you return for lab work at your convenience - FASTING.  Dr Croitoru recommends that you schedule a follow-up appointment in 12 months. You will receive a reminder letter in the mail two months in advance. If you don't receive a letter, please call our office to schedule the follow-up appointment.  If you need a refill on your cardiac medications before your next appointment, please call your pharmacy. 

## 2017-10-17 DIAGNOSIS — G4710 Hypersomnia, unspecified: Secondary | ICD-10-CM | POA: Diagnosis not present

## 2017-10-17 DIAGNOSIS — G4700 Insomnia, unspecified: Secondary | ICD-10-CM | POA: Diagnosis not present

## 2017-10-17 DIAGNOSIS — Z79891 Long term (current) use of opiate analgesic: Secondary | ICD-10-CM | POA: Diagnosis not present

## 2017-10-17 DIAGNOSIS — M461 Sacroiliitis, not elsewhere classified: Secondary | ICD-10-CM | POA: Diagnosis not present

## 2017-10-24 ENCOUNTER — Ambulatory Visit (INDEPENDENT_AMBULATORY_CARE_PROVIDER_SITE_OTHER): Payer: Medicare Other | Admitting: *Deleted

## 2017-10-24 DIAGNOSIS — Z952 Presence of prosthetic heart valve: Secondary | ICD-10-CM

## 2017-10-24 DIAGNOSIS — Z5181 Encounter for therapeutic drug level monitoring: Secondary | ICD-10-CM

## 2017-10-24 LAB — POCT INR: INR: 1.7

## 2017-10-24 NOTE — Patient Instructions (Signed)
Take coumadin 2 tablets tonight and tomorrow night then resume 1 tablet daily except 1 and 1/2 tablets on Mondays  Recheck in 3 weeks

## 2017-11-14 ENCOUNTER — Ambulatory Visit (INDEPENDENT_AMBULATORY_CARE_PROVIDER_SITE_OTHER): Payer: Medicare Other | Admitting: *Deleted

## 2017-11-14 DIAGNOSIS — Z952 Presence of prosthetic heart valve: Secondary | ICD-10-CM

## 2017-11-14 DIAGNOSIS — Z5181 Encounter for therapeutic drug level monitoring: Secondary | ICD-10-CM

## 2017-11-14 LAB — POCT INR: INR: 2

## 2017-11-14 NOTE — Patient Instructions (Signed)
Increase coumadin to 1 tablet daily except 1 and 1/2 tablets on Mondays and Thursdays Recheck in 4 weeks

## 2017-12-02 DIAGNOSIS — N183 Chronic kidney disease, stage 3 (moderate): Secondary | ICD-10-CM | POA: Diagnosis not present

## 2017-12-02 DIAGNOSIS — R809 Proteinuria, unspecified: Secondary | ICD-10-CM | POA: Diagnosis not present

## 2017-12-02 DIAGNOSIS — I10 Essential (primary) hypertension: Secondary | ICD-10-CM | POA: Diagnosis not present

## 2017-12-02 DIAGNOSIS — E559 Vitamin D deficiency, unspecified: Secondary | ICD-10-CM | POA: Diagnosis not present

## 2017-12-02 DIAGNOSIS — D509 Iron deficiency anemia, unspecified: Secondary | ICD-10-CM | POA: Diagnosis not present

## 2017-12-02 DIAGNOSIS — Z79899 Other long term (current) drug therapy: Secondary | ICD-10-CM | POA: Diagnosis not present

## 2017-12-07 DIAGNOSIS — I10 Essential (primary) hypertension: Secondary | ICD-10-CM | POA: Diagnosis not present

## 2017-12-07 DIAGNOSIS — E1129 Type 2 diabetes mellitus with other diabetic kidney complication: Secondary | ICD-10-CM | POA: Diagnosis not present

## 2017-12-07 DIAGNOSIS — N183 Chronic kidney disease, stage 3 (moderate): Secondary | ICD-10-CM | POA: Diagnosis not present

## 2017-12-07 DIAGNOSIS — R809 Proteinuria, unspecified: Secondary | ICD-10-CM | POA: Diagnosis not present

## 2017-12-26 ENCOUNTER — Other Ambulatory Visit: Payer: Self-pay | Admitting: Cardiovascular Disease

## 2017-12-26 ENCOUNTER — Ambulatory Visit (INDEPENDENT_AMBULATORY_CARE_PROVIDER_SITE_OTHER): Payer: Medicare Other | Admitting: *Deleted

## 2017-12-26 DIAGNOSIS — Z5181 Encounter for therapeutic drug level monitoring: Secondary | ICD-10-CM | POA: Diagnosis not present

## 2017-12-26 DIAGNOSIS — Z952 Presence of prosthetic heart valve: Secondary | ICD-10-CM | POA: Diagnosis not present

## 2017-12-26 LAB — POCT INR: INR: 2.3

## 2017-12-26 NOTE — Patient Instructions (Signed)
Continue coumadin 1 tablet daily except 1 1/2 tablets on Mondays and Thursdays. Recheck in 4 weeks 

## 2018-01-12 DIAGNOSIS — M461 Sacroiliitis, not elsewhere classified: Secondary | ICD-10-CM | POA: Diagnosis not present

## 2018-01-12 DIAGNOSIS — G4710 Hypersomnia, unspecified: Secondary | ICD-10-CM | POA: Diagnosis not present

## 2018-01-12 DIAGNOSIS — M25561 Pain in right knee: Secondary | ICD-10-CM | POA: Diagnosis not present

## 2018-01-12 DIAGNOSIS — G4700 Insomnia, unspecified: Secondary | ICD-10-CM | POA: Diagnosis not present

## 2018-01-23 ENCOUNTER — Ambulatory Visit (INDEPENDENT_AMBULATORY_CARE_PROVIDER_SITE_OTHER): Payer: Medicare Other | Admitting: *Deleted

## 2018-01-23 DIAGNOSIS — Z952 Presence of prosthetic heart valve: Secondary | ICD-10-CM | POA: Diagnosis not present

## 2018-01-23 DIAGNOSIS — Z5181 Encounter for therapeutic drug level monitoring: Secondary | ICD-10-CM

## 2018-01-23 LAB — POCT INR: INR: 3.3

## 2018-01-23 NOTE — Patient Instructions (Signed)
Take coumadin 1/2 tablet tonight then resume 1 tablet daily except 1 1/2 tablets on Mondays and Thursdays Recheck in 4 weeks

## 2018-01-24 DIAGNOSIS — E210 Primary hyperparathyroidism: Secondary | ICD-10-CM | POA: Diagnosis not present

## 2018-01-24 DIAGNOSIS — N182 Chronic kidney disease, stage 2 (mild): Secondary | ICD-10-CM | POA: Diagnosis not present

## 2018-01-24 DIAGNOSIS — G8929 Other chronic pain: Secondary | ICD-10-CM | POA: Diagnosis not present

## 2018-01-24 DIAGNOSIS — Z0001 Encounter for general adult medical examination with abnormal findings: Secondary | ICD-10-CM | POA: Diagnosis not present

## 2018-01-24 DIAGNOSIS — G4709 Other insomnia: Secondary | ICD-10-CM | POA: Diagnosis not present

## 2018-01-24 DIAGNOSIS — E559 Vitamin D deficiency, unspecified: Secondary | ICD-10-CM | POA: Diagnosis not present

## 2018-01-24 DIAGNOSIS — E782 Mixed hyperlipidemia: Secondary | ICD-10-CM | POA: Diagnosis not present

## 2018-01-24 DIAGNOSIS — Z6841 Body Mass Index (BMI) 40.0 and over, adult: Secondary | ICD-10-CM | POA: Diagnosis not present

## 2018-01-24 DIAGNOSIS — T829XXD Unspecified complication of cardiac and vascular prosthetic device, implant and graft, subsequent encounter: Secondary | ICD-10-CM | POA: Diagnosis not present

## 2018-01-24 DIAGNOSIS — E1129 Type 2 diabetes mellitus with other diabetic kidney complication: Secondary | ICD-10-CM | POA: Diagnosis not present

## 2018-01-24 DIAGNOSIS — F419 Anxiety disorder, unspecified: Secondary | ICD-10-CM | POA: Diagnosis not present

## 2018-01-24 DIAGNOSIS — Z1389 Encounter for screening for other disorder: Secondary | ICD-10-CM | POA: Diagnosis not present

## 2018-02-07 DIAGNOSIS — H40023 Open angle with borderline findings, high risk, bilateral: Secondary | ICD-10-CM | POA: Diagnosis not present

## 2018-02-20 ENCOUNTER — Ambulatory Visit (INDEPENDENT_AMBULATORY_CARE_PROVIDER_SITE_OTHER): Payer: Medicare Other | Admitting: *Deleted

## 2018-02-20 DIAGNOSIS — Z952 Presence of prosthetic heart valve: Secondary | ICD-10-CM | POA: Diagnosis not present

## 2018-02-20 DIAGNOSIS — Z5181 Encounter for therapeutic drug level monitoring: Secondary | ICD-10-CM | POA: Diagnosis not present

## 2018-02-20 LAB — POCT INR: INR: 2.1 (ref 2.0–3.0)

## 2018-02-20 NOTE — Patient Instructions (Signed)
Continue coumadin 1 tablet daily except 1 1/2 tablets on Mondays and Thursdays. Recheck in 4 weeks 

## 2018-03-02 DIAGNOSIS — E210 Primary hyperparathyroidism: Secondary | ICD-10-CM | POA: Diagnosis not present

## 2018-03-02 DIAGNOSIS — H60502 Unspecified acute noninfective otitis externa, left ear: Secondary | ICD-10-CM | POA: Diagnosis not present

## 2018-03-02 DIAGNOSIS — Z6841 Body Mass Index (BMI) 40.0 and over, adult: Secondary | ICD-10-CM | POA: Diagnosis not present

## 2018-03-02 DIAGNOSIS — Z0001 Encounter for general adult medical examination with abnormal findings: Secondary | ICD-10-CM | POA: Diagnosis not present

## 2018-03-02 DIAGNOSIS — Z1389 Encounter for screening for other disorder: Secondary | ICD-10-CM | POA: Diagnosis not present

## 2018-03-02 DIAGNOSIS — R945 Abnormal results of liver function studies: Secondary | ICD-10-CM | POA: Diagnosis not present

## 2018-03-20 ENCOUNTER — Ambulatory Visit (INDEPENDENT_AMBULATORY_CARE_PROVIDER_SITE_OTHER): Payer: Medicare Other | Admitting: *Deleted

## 2018-03-20 DIAGNOSIS — Z952 Presence of prosthetic heart valve: Secondary | ICD-10-CM | POA: Diagnosis not present

## 2018-03-20 DIAGNOSIS — Z5181 Encounter for therapeutic drug level monitoring: Secondary | ICD-10-CM | POA: Diagnosis not present

## 2018-03-20 LAB — POCT INR: INR: 3.5 — AB (ref 2.0–3.0)

## 2018-03-20 NOTE — Patient Instructions (Signed)
Hold coumadin tonight then resume 1 tablet daily except 1 1/2 tablets on Mondays and Thursdays Recheck in 3 weeks

## 2018-03-21 DIAGNOSIS — R945 Abnormal results of liver function studies: Secondary | ICD-10-CM | POA: Diagnosis not present

## 2018-03-30 DIAGNOSIS — Z6841 Body Mass Index (BMI) 40.0 and over, adult: Secondary | ICD-10-CM | POA: Diagnosis not present

## 2018-03-30 DIAGNOSIS — N342 Other urethritis: Secondary | ICD-10-CM | POA: Diagnosis not present

## 2018-04-10 ENCOUNTER — Ambulatory Visit (INDEPENDENT_AMBULATORY_CARE_PROVIDER_SITE_OTHER): Payer: Medicare Other | Admitting: *Deleted

## 2018-04-10 DIAGNOSIS — Z952 Presence of prosthetic heart valve: Secondary | ICD-10-CM

## 2018-04-10 DIAGNOSIS — Z5181 Encounter for therapeutic drug level monitoring: Secondary | ICD-10-CM | POA: Diagnosis not present

## 2018-04-10 LAB — POCT INR: INR: 2.7 (ref 2.0–3.0)

## 2018-04-10 NOTE — Patient Instructions (Signed)
Continue coumadin 1 tablet daily except 1 1/2 tablets on Mondays and Thursdays Recheck in 4 weeks

## 2018-04-12 DIAGNOSIS — G4710 Hypersomnia, unspecified: Secondary | ICD-10-CM | POA: Diagnosis not present

## 2018-04-12 DIAGNOSIS — M461 Sacroiliitis, not elsewhere classified: Secondary | ICD-10-CM | POA: Diagnosis not present

## 2018-04-12 DIAGNOSIS — G4700 Insomnia, unspecified: Secondary | ICD-10-CM | POA: Diagnosis not present

## 2018-04-12 DIAGNOSIS — G894 Chronic pain syndrome: Secondary | ICD-10-CM | POA: Diagnosis not present

## 2018-04-12 DIAGNOSIS — Z79891 Long term (current) use of opiate analgesic: Secondary | ICD-10-CM | POA: Diagnosis not present

## 2018-05-12 ENCOUNTER — Ambulatory Visit (INDEPENDENT_AMBULATORY_CARE_PROVIDER_SITE_OTHER): Payer: Medicare Other | Admitting: *Deleted

## 2018-05-12 DIAGNOSIS — Z5181 Encounter for therapeutic drug level monitoring: Secondary | ICD-10-CM

## 2018-05-12 DIAGNOSIS — Z952 Presence of prosthetic heart valve: Secondary | ICD-10-CM

## 2018-05-12 LAB — POCT INR: INR: 4.8 — AB (ref 2.0–3.0)

## 2018-05-12 NOTE — Patient Instructions (Signed)
Description   Skip today and tomorrow's dose, then Continue coumadin 1 tablet daily except 1 1/2 tablets on Mondays and Thursdays. Recheck in 10-12 days.

## 2018-05-29 ENCOUNTER — Ambulatory Visit (INDEPENDENT_AMBULATORY_CARE_PROVIDER_SITE_OTHER): Payer: Medicare Other | Admitting: *Deleted

## 2018-05-29 DIAGNOSIS — Z952 Presence of prosthetic heart valve: Secondary | ICD-10-CM

## 2018-05-29 DIAGNOSIS — Z5181 Encounter for therapeutic drug level monitoring: Secondary | ICD-10-CM | POA: Diagnosis not present

## 2018-05-29 LAB — POCT INR: INR: 2.8 (ref 2.0–3.0)

## 2018-05-29 NOTE — Patient Instructions (Signed)
Continue coumadin 1 tablet daily except 1 1/2 tablets on Mondays and Thursdays. Recheck in 4 weeks

## 2018-06-05 DIAGNOSIS — I10 Essential (primary) hypertension: Secondary | ICD-10-CM | POA: Diagnosis not present

## 2018-06-05 DIAGNOSIS — E559 Vitamin D deficiency, unspecified: Secondary | ICD-10-CM | POA: Diagnosis not present

## 2018-06-05 DIAGNOSIS — R809 Proteinuria, unspecified: Secondary | ICD-10-CM | POA: Diagnosis not present

## 2018-06-05 DIAGNOSIS — N183 Chronic kidney disease, stage 3 (moderate): Secondary | ICD-10-CM | POA: Diagnosis not present

## 2018-06-05 DIAGNOSIS — Z79899 Other long term (current) drug therapy: Secondary | ICD-10-CM | POA: Diagnosis not present

## 2018-06-05 DIAGNOSIS — D509 Iron deficiency anemia, unspecified: Secondary | ICD-10-CM | POA: Diagnosis not present

## 2018-06-14 DIAGNOSIS — R809 Proteinuria, unspecified: Secondary | ICD-10-CM | POA: Diagnosis not present

## 2018-06-14 DIAGNOSIS — E1129 Type 2 diabetes mellitus with other diabetic kidney complication: Secondary | ICD-10-CM | POA: Diagnosis not present

## 2018-06-14 DIAGNOSIS — I509 Heart failure, unspecified: Secondary | ICD-10-CM | POA: Diagnosis not present

## 2018-06-14 DIAGNOSIS — N182 Chronic kidney disease, stage 2 (mild): Secondary | ICD-10-CM | POA: Diagnosis not present

## 2018-06-26 ENCOUNTER — Ambulatory Visit (INDEPENDENT_AMBULATORY_CARE_PROVIDER_SITE_OTHER): Payer: Medicare Other | Admitting: *Deleted

## 2018-06-26 DIAGNOSIS — Z5181 Encounter for therapeutic drug level monitoring: Secondary | ICD-10-CM

## 2018-06-26 DIAGNOSIS — Z952 Presence of prosthetic heart valve: Secondary | ICD-10-CM

## 2018-06-26 LAB — POCT INR: INR: 5.6 — AB (ref 2.0–3.0)

## 2018-06-26 NOTE — Patient Instructions (Signed)
Hold coumadin tonight and tomorrow night then decrease dose to 1 tablet daily.  Recheck in 1 week

## 2018-07-04 ENCOUNTER — Ambulatory Visit (INDEPENDENT_AMBULATORY_CARE_PROVIDER_SITE_OTHER): Payer: Medicare Other | Admitting: *Deleted

## 2018-07-04 DIAGNOSIS — Z952 Presence of prosthetic heart valve: Secondary | ICD-10-CM

## 2018-07-04 DIAGNOSIS — Z5181 Encounter for therapeutic drug level monitoring: Secondary | ICD-10-CM | POA: Diagnosis not present

## 2018-07-04 LAB — POCT INR: INR: 3.1 — AB (ref 2.0–3.0)

## 2018-07-04 NOTE — Patient Instructions (Signed)
Take coumadin 1/2 tablet tonight then resume 1 tablet daily.  Recheck in 3 weeks

## 2018-07-10 DIAGNOSIS — Z6841 Body Mass Index (BMI) 40.0 and over, adult: Secondary | ICD-10-CM | POA: Diagnosis not present

## 2018-07-10 DIAGNOSIS — Z23 Encounter for immunization: Secondary | ICD-10-CM | POA: Diagnosis not present

## 2018-07-10 DIAGNOSIS — E119 Type 2 diabetes mellitus without complications: Secondary | ICD-10-CM | POA: Diagnosis not present

## 2018-07-10 DIAGNOSIS — Z1389 Encounter for screening for other disorder: Secondary | ICD-10-CM | POA: Diagnosis not present

## 2018-07-10 DIAGNOSIS — I10 Essential (primary) hypertension: Secondary | ICD-10-CM | POA: Diagnosis not present

## 2018-07-10 DIAGNOSIS — E7800 Pure hypercholesterolemia, unspecified: Secondary | ICD-10-CM | POA: Diagnosis not present

## 2018-07-13 DIAGNOSIS — Z79891 Long term (current) use of opiate analgesic: Secondary | ICD-10-CM | POA: Diagnosis not present

## 2018-07-13 DIAGNOSIS — G894 Chronic pain syndrome: Secondary | ICD-10-CM | POA: Diagnosis not present

## 2018-07-13 DIAGNOSIS — G4700 Insomnia, unspecified: Secondary | ICD-10-CM | POA: Diagnosis not present

## 2018-07-13 DIAGNOSIS — M461 Sacroiliitis, not elsewhere classified: Secondary | ICD-10-CM | POA: Diagnosis not present

## 2018-07-13 DIAGNOSIS — G4710 Hypersomnia, unspecified: Secondary | ICD-10-CM | POA: Diagnosis not present

## 2018-07-24 ENCOUNTER — Ambulatory Visit (INDEPENDENT_AMBULATORY_CARE_PROVIDER_SITE_OTHER): Payer: Medicare Other | Admitting: *Deleted

## 2018-07-24 DIAGNOSIS — Z5181 Encounter for therapeutic drug level monitoring: Secondary | ICD-10-CM

## 2018-07-24 DIAGNOSIS — Z952 Presence of prosthetic heart valve: Secondary | ICD-10-CM | POA: Diagnosis not present

## 2018-07-24 LAB — POCT INR: INR: 2.2 (ref 2.0–3.0)

## 2018-07-24 NOTE — Patient Instructions (Signed)
Continue coumadin 1 tablet daily Recheck in 4 weeks 

## 2018-07-31 ENCOUNTER — Other Ambulatory Visit: Payer: Self-pay | Admitting: Cardiovascular Disease

## 2018-08-21 ENCOUNTER — Ambulatory Visit (INDEPENDENT_AMBULATORY_CARE_PROVIDER_SITE_OTHER): Payer: Medicare Other | Admitting: *Deleted

## 2018-08-21 DIAGNOSIS — Z5181 Encounter for therapeutic drug level monitoring: Secondary | ICD-10-CM | POA: Diagnosis not present

## 2018-08-21 DIAGNOSIS — Z952 Presence of prosthetic heart valve: Secondary | ICD-10-CM

## 2018-08-21 LAB — POCT INR: INR: 2 (ref 2.0–3.0)

## 2018-08-21 NOTE — Addendum Note (Signed)
Addended by: Malen Gauze on: 08/21/2018 10:54 AM   Modules accepted: Level of Service

## 2018-08-21 NOTE — Patient Instructions (Signed)
Take coumadin 1 1/2 tablets tonight then resume 1 tablet daily.  Recheck in 5 weeks

## 2018-09-18 ENCOUNTER — Other Ambulatory Visit: Payer: Self-pay | Admitting: Cardiovascular Disease

## 2018-09-25 ENCOUNTER — Ambulatory Visit (INDEPENDENT_AMBULATORY_CARE_PROVIDER_SITE_OTHER): Payer: Medicare Other | Admitting: Pharmacist

## 2018-09-25 DIAGNOSIS — Z952 Presence of prosthetic heart valve: Secondary | ICD-10-CM | POA: Diagnosis not present

## 2018-09-25 DIAGNOSIS — Z5181 Encounter for therapeutic drug level monitoring: Secondary | ICD-10-CM | POA: Diagnosis not present

## 2018-09-25 LAB — POCT INR: INR: 2.2 (ref 2.0–3.0)

## 2018-09-25 NOTE — Patient Instructions (Addendum)
Description   Continue 1 tablet daily.  Recheck in 6 weeks

## 2018-10-02 ENCOUNTER — Other Ambulatory Visit (HOSPITAL_COMMUNITY): Payer: Self-pay | Admitting: Internal Medicine

## 2018-10-02 DIAGNOSIS — Z1231 Encounter for screening mammogram for malignant neoplasm of breast: Secondary | ICD-10-CM

## 2018-10-05 DIAGNOSIS — M461 Sacroiliitis, not elsewhere classified: Secondary | ICD-10-CM | POA: Diagnosis not present

## 2018-10-05 DIAGNOSIS — Z79891 Long term (current) use of opiate analgesic: Secondary | ICD-10-CM | POA: Diagnosis not present

## 2018-10-05 DIAGNOSIS — G4710 Hypersomnia, unspecified: Secondary | ICD-10-CM | POA: Diagnosis not present

## 2018-10-05 DIAGNOSIS — G4700 Insomnia, unspecified: Secondary | ICD-10-CM | POA: Diagnosis not present

## 2018-10-08 DIAGNOSIS — Z1211 Encounter for screening for malignant neoplasm of colon: Secondary | ICD-10-CM | POA: Diagnosis not present

## 2018-10-20 ENCOUNTER — Ambulatory Visit: Payer: Medicare Other | Admitting: Cardiovascular Disease

## 2018-10-23 ENCOUNTER — Ambulatory Visit (INDEPENDENT_AMBULATORY_CARE_PROVIDER_SITE_OTHER): Payer: Medicare Other | Admitting: Cardiovascular Disease

## 2018-10-23 ENCOUNTER — Encounter: Payer: Self-pay | Admitting: Cardiovascular Disease

## 2018-10-23 VITALS — BP 132/84 | HR 66 | Ht 62.0 in | Wt 255.6 lb

## 2018-10-23 DIAGNOSIS — I10 Essential (primary) hypertension: Secondary | ICD-10-CM | POA: Diagnosis not present

## 2018-10-23 DIAGNOSIS — E782 Mixed hyperlipidemia: Secondary | ICD-10-CM

## 2018-10-23 DIAGNOSIS — E669 Obesity, unspecified: Secondary | ICD-10-CM | POA: Diagnosis not present

## 2018-10-23 DIAGNOSIS — Z952 Presence of prosthetic heart valve: Secondary | ICD-10-CM | POA: Diagnosis not present

## 2018-10-23 DIAGNOSIS — E1169 Type 2 diabetes mellitus with other specified complication: Secondary | ICD-10-CM | POA: Insufficient documentation

## 2018-10-23 NOTE — Progress Notes (Signed)
Cardiology Office Note    Date:  10/23/2018   ID:  CHELSE Costa, DOB 12/28/1950, MRN [id number]  PCP:  Redmond School, MD  Cardiologist:   Sanda Klein, MD   Chief complaint: Follow-up mechanical aortic valve replacement   History of Present Illness:  [name] is a 68 y.o. female here for follow-up of mechanical aortic valve replacement and ascending aortic aneurysm repair Deneen Harts procedure, 2011 , 21 mm valve) performed for bicuspid aortic valve with critical aortic stenosis. Additional problems include hypertension, diabetes mellitus, mixed hyperlipidemia in the setting of morbid obesity.  She has had a good year without any hospitalizations, falls or bleeding problems or new health issues.  Her last echocardiogram was performed in January 2019 and showed normal gradients for the 21 mm mechanical aortic valve, albeit a little higher than they had been 5 years earlier.  Prothrombin time monitoring is performed in [place].  She has some problems with hyperparathyroidism, but her values of calcium and intact parathyroid hormone have normalized.  Her kidney function has improved.  The patient specifically denies any chest pain at rest exertion, dyspnea at rest or with exertion, orthopnea, paroxysmal nocturnal dyspnea, syncope, palpitations, focal neurological deficits, intermittent claudication, lower extremity edema, unexplained weight gain, cough, hemoptysis or wheezing.   Past Medical History:  Diagnosis Date  . AI (aortic insufficiency)   . Arthritis   . AS (aortic stenosis) 09/18/2012   2D Echo EF 55%-60%  . Chronic kidney disease    renal insuffcentily  . Coronary artery disease   . Cough   . Diabetes mellitus ([place])   . Gout    right foot  . Hyperlipidemia   . Hypertension    dr Donetta Potts  . Obese   . PONV (postoperative nausea and vomiting)    with early surgeries , none recent  . Renal insufficiency   . Tricuspid regurgitation     Past  Surgical History:  Procedure Laterality Date  . AORTIC VALVE REPLACEMENT  04/01/2010   21-mm Sr Jude Regent ;mechanical valve   . BACK SURGERY    . broken arm     wire in lt elbow  . CARDIAC CATHETERIZATION  02/09/2010  . FRACTURE SURGERY    . GANGLION CYST EXCISION Left 01/08/2016   Procedure: LEFT WRIST EXCISION OF VOLAR CYST, EXCISION OF MUCOID TUMOR;  Surgeon: Daryll Brod, MD;  Location: [facility];  Service: Orthopedics;  Laterality: Left;  . LUMBAR LAMINECTOMY/DECOMPRESSION MICRODISCECTOMY N/A 02/28/2013   Procedure: LUMBAR LAMINECTOMY/DECOMPRESSION MICRODISCECTOMY 2 LEVELS;  Surgeon: Ophelia Charter, MD;  Location: [facility];  Service: Neurosurgery;  Laterality: N/A;  Lumbar three-four and Lumbar four-five laminectomies  . MASS EXCISION Left 01/08/2016   Procedure: EXCISION MUCOID TUMOR LEFT INDEX FINGER DISTAL INTERPHALANGEAL JOINT;  Surgeon: Daryll Brod, MD;  Location: [facility];  Service: Orthopedics;  Laterality: Left;  . replacement  of ascending aortic aneurysm  04/01/2010   28-mm supracoronary Hemashield tube graft using deep hypothermic circulatory arrest    Current Medications: Outpatient Medications Prior to Visit  Medication Sig Dispense Refill  . allopurinol (ZYLOPRIM) 100 MG tablet Take 100 mg by mouth daily.    Marland Kitchen ALPRAZolam (XANAX) 0.5 MG tablet Take 0.5 mg by mouth at bedtime as needed for sleep.     Marland Kitchen b complex vitamins tablet Take 1 tablet by mouth daily.    . Biotin 10000 MCG TABS Take 2,000 mg by mouth daily at 3 pm.    .  cetirizine (ZYRTEC) 10 MG tablet Take 10 mg by mouth daily.    Marland Kitchen gabapentin (NEURONTIN) 300 MG capsule Take 300 mg by mouth daily.    Marland Kitchen glimepiride (AMARYL) 2 MG tablet Take 2 mg by mouth daily before breakfast.    . HYDROcodone-acetaminophen (NORCO/VICODIN) 5-325 MG tablet Take 1 tablet by mouth every 8 (eight) hours as needed.    Marland Kitchen lisinopril (PRINIVIL,ZESTRIL) 10 MG tablet Take 10 mg by mouth daily.    .  metoprolol tartrate (LOPRESSOR) 50 MG tablet TAKE 1/2 (ONE-HALF) TABLET BY MOUTH TWICE DAILY 30 tablet 1  . Multiple Vitamin (MULTIVITAMIN WITH MINERALS) TABS Take 1 tablet by mouth daily.    . multivitamin-lutein (OCUVITE-LUTEIN) CAPS Take 1 capsule by mouth daily.    . Omega-3 Fatty Acids (FISH OIL) 1000 MG CAPS Take 1,000 mg by mouth daily.    . pravastatin (PRAVACHOL) 80 MG tablet Take 80 mg by mouth daily.    Marland Kitchen topiramate (TOPAMAX) 25 MG tablet Take 25 mg by mouth 2 (two) times daily.    Marland Kitchen warfarin (COUMADIN) 5 MG tablet TAKE 1 TO 1 & 1/2 (ONE & ONE-HALF) TABLETS BY MOUTH ONCE DAILY AS DIRECTED BY  COUMADIN  CLINIC 120 tablet 1  . zolpidem (AMBIEN) 10 MG tablet Take 10 mg by mouth at bedtime.     Marland Kitchen oxyCODONE-acetaminophen (PERCOCET/ROXICET) 5-325 MG tablet Take 1 tablet by mouth every 4 (four) hours as needed. 20 tablet 0   No facility-administered medications prior to visit.      Allergies:   Morphine and related   Social History   Socioeconomic History  . Marital status: Married    Spouse name: Not on file  . Number of children: Not on file  . Years of education: Not on file  . Highest education level: Not on file  Occupational History  . Not on file  Social Needs  . Financial resource strain: Not on file  . Food insecurity:    Worry: Not on file    Inability: Not on file  . Transportation needs:    Medical: Not on file    Non-medical: Not on file  Tobacco Use  . Smoking status: Never Smoker  . Smokeless tobacco: Never Used  Substance and Sexual Activity  . Alcohol use: No  . Drug use: No  . Sexual activity: Not on file  Lifestyle  . Physical activity:    Days per week: Not on file    Minutes per session: Not on file  . Stress: Not on file  Relationships  . Social connections:    Talks on phone: Not on file    Gets together: Not on file    Attends religious service: Not on file    Active member of club or organization: Not on file    Attends meetings of clubs  or organizations: Not on file    Relationship status: Not on file  Other Topics Concern  . Not on file  Social History Narrative  . Not on file     Family History:  The patient's family history includes CAD in an other family member; Cancer - Lung in her maternal grandfather; Cancer - Prostate in her father; Diabetes in her mother and another family member; Hypertension in an other family member.   ROS:   Please see the history of present illness.    ROS all other systems are reviewed and are negative   PHYSICAL EXAM:   VS:  BP 132/84   Pulse  66   Ht 5' 2"  (1.575 m)   Wt 255 lb 9.6 oz (115.9 kg)   BMI 46.75 kg/m      General: Alert, oriented x3, no distress, morbidly obese Head: no evidence of trauma, PERRL, EOMI, no exophtalmos or lid lag, no myxedema, no xanthelasma; normal ears, nose and oropharynx Neck: normal jugular venous pulsations and no hepatojugular reflux; brisk carotid pulses without delay and no carotid bruits Chest: clear to auscultation, no signs of consolidation by percussion or palpation, normal fremitus, symmetrical and full respiratory excursions Cardiovascular: normal position and quality of the apical impulse, regular rhythm, crisp prosthetic valve sounds, 1-2/6 early peaking systolic murmur with crescendo-decrescendo pattern in the aortic focus, no diastolic murmurs, rubs or gallops Abdomen: no tenderness or distention, no masses by palpation, no abnormal pulsatility or arterial bruits, normal bowel sounds, no hepatosplenomegaly Extremities: no clubbing, cyanosis or edema; 2+ radial, ulnar and brachial pulses bilaterally; 2+ right femoral, posterior tibial and dorsalis pedis pulses; 2+ left femoral, posterior tibial and dorsalis pedis pulses; no subclavian or femoral bruits Neurological: grossly nonfocal Psych: Normal mood and affect    Wt Readings from Last 3 Encounters:  10/23/18 255 lb 9.6 oz (115.9 kg)  10/13/17 257 lb (116.6 kg)  09/24/16 252 lb  (114.3 kg)      Studies/Labs Reviewed:   ECHO 10/13/2017:   - Left ventricle: The cavity size was normal. There was moderate concentric hypertrophy. Systolic function was normal. The   estimated ejection fraction was in the range of 55% to 60%. Wall motion was normal; there were no regional wall motion   abnormalities. There was an increased relative contribution of atrial contraction to ventricular filling. Doppler parameters are   consistent with abnormal left ventricular relaxation (grade 1 diastolic dysfunction). - Aortic valve: There was very mild stenosis. Mean gradient (S): 13 mm Hg. - Aorta: s/p Bentall procedures with normal aortic root and ascending aortic dimensions. - Mitral valve: Calcified annulus. - Left atrium: The atrium was mildly dilated. - Right ventricle: The cavity size was mildly dilated. Wall thickness was normal. - Pulmonary arteries: PA peak pressure: 39 mm Hg (S).  Impressions:  - Moderate LVH, normal LVF, mild TR, MAC, mild TR, mild PHT with PASP estimated at 36mHg, G1DD, mild RVE, S/P Bentall with normal aortic root and ascending aortic dimensions, calcified poorly visualized AV with very mild AS. The right ventricular systolic pressure was increased consistent with mild pulmonary hypertension.  EKG:  EKG is ordered today.  It shows sinus rhythm with isolated T wave inversion leads I and aVL, identical to the one from 1 year ago.  Recent Labs: June 05, 2018 Hemoglobin 13.5, normal iron studies except high ferritin creatinine 0.91 (improved), BUN 15 , calcium 9.9, potassium 4.5 Normalized intact PTH 49  Lipid Panel February 2017 total cholesterol 269, triglycerides 223, HDL 57, LDL 167 (more recent labs are not available)  ASSESSMENT:    1. H/O mechanical aortic valve replacement   2. Morbid obesity ([place]   3. Mixed hyperlipidemia   4. Diabetes mellitus type 2 in obese ([place]   5. Essential hypertension      PLAN:  In order of problems  listed above:  1. S/P AVR: No signs of any problems clinically and with normal exam.  Appropriate anticoagulation.  It is more likely that her pulmonary hypertension is related to her morbid obesity than to valvular heart disease.  Plan to check echocardiograms every 3 years or so. 2. Obesity: As before, strongly  encouraged weight loss. 3. HLP: Reports this was checked by her PCP we will try to get those more recent results.  She did not have coronary disease at pre-surgery cardiac catheterization.  On statin. 4. DM: Reports good control, I do not have her most recent hemoglobin A1c. 5. HTN: Well-controlled 6. HyperCa: Improved.  Avoid thiazide diuretics.    Medication Adjustments/Labs and Tests Ordered: Current medicines are reviewed at length with the patient today.  Concerns regarding medicines are outlined above.  Medication changes, Labs and Tests ordered today are listed in the Patient Instructions below. Patient Instructions  Medication Instructions:  Your physician recommends that you continue on your current medications as directed. Please refer to the Current Medication list given to you today. If you need a refill on your cardiac medications before your next appointment, please call your pharmacy.   Lab work: None  If you have labs (blood work) drawn today and your tests are completely normal, you will receive your results only by: Marland Kitchen MyChart Message (if you have MyChart) OR . A paper copy in the mail If you have any lab test that is abnormal or we need to change your treatment, we will call you to review the results.  Testing/Procedures: none  Follow-Up: At [facility], you and your health needs are our priority.  As part of our continuing mission to provide you with exceptional heart care, we have created designated Provider Care Teams.  These Care Teams include your primary Cardiologist (physician) and Advanced Practice Providers (APPs -  Physician Assistants and Nurse  Practitioners) who all work together to provide you with the care you need, when you need it. You will need a follow up appointment in 12 months.  Please call our office 2 months in advance to schedule this appointment.  You may see Sanda Klein, MD or one of the following Advanced Practice Providers on your designated Care Team: [place], [place] . Fabian Sharp, PA-C  Any Other Special Instructions Will Be Listed Below (If Applicable).       Signed, Sanda Klein, MD  10/23/2018 6:47 PM    [facility] [address], [place], [place]  [address] Phone: [phone]; Fax: [phone]

## 2018-10-23 NOTE — Patient Instructions (Signed)
Medication Instructions:  Your physician recommends that you continue on your current medications as directed. Please refer to the Current Medication list given to you today. If you need a refill on your cardiac medications before your next appointment, please call your pharmacy.   Lab work: None  If you have labs (blood work) drawn today and your tests are completely normal, you will receive your results only by: Marland Kitchen MyChart Message (if you have MyChart) OR . A paper copy in the mail If you have any lab test that is abnormal or we need to change your treatment, we will call you to review the results.  Testing/Procedures: none  Follow-Up: At [facility], you and your health needs are our priority.  As part of our continuing mission to provide you with exceptional heart care, we have created designated Provider Care Teams.  These Care Teams include your primary Cardiologist (physician) and Advanced Practice Providers (APPs -  Physician Assistants and Nurse Practitioners) who all work together to provide you with the care you need, when you need it. You will need a follow up appointment in 12 months.  Please call our office 2 months in advance to schedule this appointment.  You may see Sanda Klein, MD or one of the following Advanced Practice Providers on your designated Care Team: [place], [place] . Fabian Sharp, PA-C  Any Other Special Instructions Will Be Listed Below (If Applicable).

## 2018-10-26 ENCOUNTER — Ambulatory Visit (HOSPITAL_COMMUNITY)
Admission: RE | Admit: 2018-10-26 | Discharge: 2018-10-26 | Disposition: A | Discharge status: Home / Self Care | Payer: Medicare Other | Source: Ambulatory Visit | Attending: Internal Medicine | Admitting: Internal Medicine

## 2018-10-26 ENCOUNTER — Ambulatory Visit (HOSPITAL_COMMUNITY): Payer: Medicare Other

## 2018-10-26 DIAGNOSIS — Z1231 Encounter for screening mammogram for malignant neoplasm of breast: Secondary | ICD-10-CM | POA: Diagnosis not present

## 2018-11-06 ENCOUNTER — Ambulatory Visit (INDEPENDENT_AMBULATORY_CARE_PROVIDER_SITE_OTHER): Payer: Medicare Other | Admitting: Pharmacist

## 2018-11-06 DIAGNOSIS — Z952 Presence of prosthetic heart valve: Secondary | ICD-10-CM

## 2018-11-06 DIAGNOSIS — Z5181 Encounter for therapeutic drug level monitoring: Secondary | ICD-10-CM

## 2018-11-06 LAB — POCT INR: INR: 3.3 — AB (ref 2.0–3.0)

## 2018-11-06 NOTE — Patient Instructions (Signed)
Description   No warfarin today then continue 1 tablet daily.  Recheck in 6 weeks.

## 2018-11-20 ENCOUNTER — Other Ambulatory Visit: Payer: Self-pay | Admitting: Cardiovascular Disease

## 2018-11-27 DIAGNOSIS — Z6841 Body Mass Index (BMI) 40.0 and over, adult: Secondary | ICD-10-CM | POA: Diagnosis not present

## 2018-11-27 DIAGNOSIS — Z862 Personal history of diseases of the blood and blood-forming organs and certain disorders involving the immune mechanism: Secondary | ICD-10-CM | POA: Diagnosis not present

## 2018-11-27 DIAGNOSIS — Z1389 Encounter for screening for other disorder: Secondary | ICD-10-CM | POA: Diagnosis not present

## 2018-11-27 DIAGNOSIS — I10 Essential (primary) hypertension: Secondary | ICD-10-CM | POA: Diagnosis not present

## 2018-11-27 DIAGNOSIS — F419 Anxiety disorder, unspecified: Secondary | ICD-10-CM | POA: Diagnosis not present

## 2018-11-27 DIAGNOSIS — G4700 Insomnia, unspecified: Secondary | ICD-10-CM | POA: Diagnosis not present

## 2018-12-20 ENCOUNTER — Telehealth: Payer: Self-pay | Admitting: Cardiology

## 2018-12-20 NOTE — Telephone Encounter (Signed)

## 2018-12-21 ENCOUNTER — Ambulatory Visit (INDEPENDENT_AMBULATORY_CARE_PROVIDER_SITE_OTHER): Payer: Medicare Other | Admitting: *Deleted

## 2018-12-21 ENCOUNTER — Other Ambulatory Visit: Payer: Self-pay

## 2018-12-21 DIAGNOSIS — Z952 Presence of prosthetic heart valve: Secondary | ICD-10-CM

## 2018-12-21 DIAGNOSIS — Z5181 Encounter for therapeutic drug level monitoring: Secondary | ICD-10-CM | POA: Diagnosis not present

## 2018-12-21 LAB — POCT INR: INR: 7.5 — AB (ref 2.0–3.0)

## 2018-12-21 NOTE — Patient Instructions (Signed)
POC INR 7.5  Recheck POC INR 7.7  Pt not sent to lab due to COVID 19 Hold warfarin x 4 days and recheck INR on Monday 12/25/18  Bleeding and fall precautions discussed with pt and she verbalized understanding.  Pt to go to ED if bleeding occurs

## 2018-12-22 ENCOUNTER — Telehealth: Payer: Self-pay | Admitting: Cardiology

## 2018-12-22 NOTE — Telephone Encounter (Signed)
° °  COVID-19 Pre-Screening Questions:   Do you currently have a fever? No  Have you recently travelled on a cruise, internationally, or to [place], [place], [place], [place], [place], or [place], [place] [employer]) ? NO  Have you been in contact with someone that is currently pending confirmation of Covid19 testing or has been confirmed to have the [place] virus?  No Are you currently experiencing fatigue or cough?No

## 2018-12-25 ENCOUNTER — Ambulatory Visit (INDEPENDENT_AMBULATORY_CARE_PROVIDER_SITE_OTHER): Payer: Medicare Other | Admitting: *Deleted

## 2018-12-25 ENCOUNTER — Other Ambulatory Visit: Payer: Self-pay

## 2018-12-25 DIAGNOSIS — Z952 Presence of prosthetic heart valve: Secondary | ICD-10-CM

## 2018-12-25 DIAGNOSIS — Z5181 Encounter for therapeutic drug level monitoring: Secondary | ICD-10-CM | POA: Diagnosis not present

## 2018-12-25 LAB — POCT INR: INR: 1.5 — AB (ref 2.0–3.0)

## 2018-12-25 NOTE — Patient Instructions (Signed)
Restart coumadin 1 tablet daily.  Recheck in 1 week

## 2019-01-01 ENCOUNTER — Telehealth: Payer: Self-pay | Admitting: *Deleted

## 2019-01-01 NOTE — Telephone Encounter (Signed)
° °  COVID-19 Pre-Screening Questions: ° °• Do you currently have a fever?NO ° ° °• Have you recently travelled on a cruise, internationally, or to [place], [place], [place], [place], [place], or [place], [place] ([employer]) ? NO °•  °• Have you been in contact with someone that is currently pending confirmation of Covid19 testing or has been confirmed to have the Covid19 virus?  NO °•  °Are you currently experiencing fatigue or cough? NO ° ° °   ° ° ° ° °

## 2019-01-01 NOTE — Telephone Encounter (Signed)
° ° ° °  COVID-19 Pre-Screening Questions:   Do you currently have a fever? (yes = cancel and refer to pcp for e-visit)  Have you recently travelled on a cruise, internationally, or to [place], [place], [place], [place], [place], or [place], [place] [employer]) ? (yes = cancel, stay home, monitor symptoms, and contact pcp or initiate e-visit if symptoms develop)  Have you been in contact with someone that is currently pending confirmation of Covid19 testing or has been confirmed to have the Covid19 virus?   (yes = cancel, stay home, away from tested individual, monitor symptoms, and contact pcp or initiate e-visit if symptoms develop)  Are you currently experiencing fatigue or cough? (yes = pt should be prepared to have a mask placed at the time of their visit).

## 2019-01-02 ENCOUNTER — Ambulatory Visit (INDEPENDENT_AMBULATORY_CARE_PROVIDER_SITE_OTHER): Payer: Medicare Other | Admitting: *Deleted

## 2019-01-02 ENCOUNTER — Other Ambulatory Visit: Payer: Self-pay

## 2019-01-02 DIAGNOSIS — Z5181 Encounter for therapeutic drug level monitoring: Secondary | ICD-10-CM | POA: Diagnosis not present

## 2019-01-02 DIAGNOSIS — Z952 Presence of prosthetic heart valve: Secondary | ICD-10-CM | POA: Diagnosis not present

## 2019-01-02 LAB — POCT INR: INR: 3.4 — AB (ref 2.0–3.0)

## 2019-01-02 NOTE — Patient Instructions (Signed)
Hold coumadin tonight then decrease dose to 1 tablet daily except 1/2 tablet on Tuesdays and Fridays.  Recheck in 3 weeks

## 2019-01-04 DIAGNOSIS — G4710 Hypersomnia, unspecified: Secondary | ICD-10-CM | POA: Diagnosis not present

## 2019-01-04 DIAGNOSIS — Z79891 Long term (current) use of opiate analgesic: Secondary | ICD-10-CM | POA: Diagnosis not present

## 2019-01-04 DIAGNOSIS — G4700 Insomnia, unspecified: Secondary | ICD-10-CM | POA: Diagnosis not present

## 2019-01-04 DIAGNOSIS — M545 Low back pain: Secondary | ICD-10-CM | POA: Diagnosis not present

## 2019-01-04 DIAGNOSIS — G894 Chronic pain syndrome: Secondary | ICD-10-CM | POA: Diagnosis not present

## 2019-01-24 ENCOUNTER — Other Ambulatory Visit: Payer: Self-pay

## 2019-01-24 ENCOUNTER — Ambulatory Visit (INDEPENDENT_AMBULATORY_CARE_PROVIDER_SITE_OTHER): Payer: Medicare Other | Admitting: *Deleted

## 2019-01-24 DIAGNOSIS — Z5181 Encounter for therapeutic drug level monitoring: Secondary | ICD-10-CM

## 2019-01-24 DIAGNOSIS — Z952 Presence of prosthetic heart valve: Secondary | ICD-10-CM

## 2019-01-24 LAB — POCT INR: INR: 2.9 (ref 2.0–3.0)

## 2019-01-24 NOTE — Patient Instructions (Signed)
Continue coumadin 1 tablet daily except 1/2 tablet on Tuesdays and Fridays.  Recheck in 3 weeks

## 2019-02-14 ENCOUNTER — Ambulatory Visit (INDEPENDENT_AMBULATORY_CARE_PROVIDER_SITE_OTHER): Payer: Medicare Other | Admitting: *Deleted

## 2019-02-14 DIAGNOSIS — Z952 Presence of prosthetic heart valve: Secondary | ICD-10-CM | POA: Diagnosis not present

## 2019-02-14 DIAGNOSIS — Z5181 Encounter for therapeutic drug level monitoring: Secondary | ICD-10-CM | POA: Diagnosis not present

## 2019-02-14 LAB — POCT INR: INR: 3 (ref 2.0–3.0)

## 2019-02-14 NOTE — Patient Instructions (Signed)
Continue coumadin 1 tablet daily except 1/2 tablet on Tuesdays and Fridays.  Recheck in 4 weeks

## 2019-02-19 DIAGNOSIS — Z1389 Encounter for screening for other disorder: Secondary | ICD-10-CM | POA: Diagnosis not present

## 2019-02-19 DIAGNOSIS — E1129 Type 2 diabetes mellitus with other diabetic kidney complication: Secondary | ICD-10-CM | POA: Diagnosis not present

## 2019-02-19 DIAGNOSIS — E782 Mixed hyperlipidemia: Secondary | ICD-10-CM | POA: Diagnosis not present

## 2019-02-19 DIAGNOSIS — Z681 Body mass index (BMI) 19 or less, adult: Secondary | ICD-10-CM | POA: Diagnosis not present

## 2019-02-19 DIAGNOSIS — Z0000 Encounter for general adult medical examination without abnormal findings: Secondary | ICD-10-CM | POA: Diagnosis not present

## 2019-02-19 DIAGNOSIS — E210 Primary hyperparathyroidism: Secondary | ICD-10-CM | POA: Diagnosis not present

## 2019-02-23 DIAGNOSIS — E7849 Other hyperlipidemia: Secondary | ICD-10-CM | POA: Diagnosis not present

## 2019-02-23 DIAGNOSIS — Z0000 Encounter for general adult medical examination without abnormal findings: Secondary | ICD-10-CM | POA: Diagnosis not present

## 2019-02-23 DIAGNOSIS — E210 Primary hyperparathyroidism: Secondary | ICD-10-CM | POA: Diagnosis not present

## 2019-02-23 DIAGNOSIS — R945 Abnormal results of liver function studies: Secondary | ICD-10-CM | POA: Diagnosis not present

## 2019-02-23 DIAGNOSIS — E1129 Type 2 diabetes mellitus with other diabetic kidney complication: Secondary | ICD-10-CM | POA: Diagnosis not present

## 2019-02-23 DIAGNOSIS — E782 Mixed hyperlipidemia: Secondary | ICD-10-CM | POA: Diagnosis not present

## 2019-02-23 DIAGNOSIS — E119 Type 2 diabetes mellitus without complications: Secondary | ICD-10-CM | POA: Diagnosis not present

## 2019-02-26 DIAGNOSIS — N183 Chronic kidney disease, stage 3 (moderate): Secondary | ICD-10-CM | POA: Diagnosis not present

## 2019-02-26 DIAGNOSIS — R945 Abnormal results of liver function studies: Secondary | ICD-10-CM | POA: Diagnosis not present

## 2019-02-26 DIAGNOSIS — E210 Primary hyperparathyroidism: Secondary | ICD-10-CM | POA: Diagnosis not present

## 2019-02-26 DIAGNOSIS — E119 Type 2 diabetes mellitus without complications: Secondary | ICD-10-CM | POA: Diagnosis not present

## 2019-02-26 DIAGNOSIS — Z6841 Body Mass Index (BMI) 40.0 and over, adult: Secondary | ICD-10-CM | POA: Diagnosis not present

## 2019-02-26 DIAGNOSIS — L259 Unspecified contact dermatitis, unspecified cause: Secondary | ICD-10-CM | POA: Diagnosis not present

## 2019-02-26 DIAGNOSIS — Z23 Encounter for immunization: Secondary | ICD-10-CM | POA: Diagnosis not present

## 2019-02-26 DIAGNOSIS — Z0001 Encounter for general adult medical examination with abnormal findings: Secondary | ICD-10-CM | POA: Diagnosis not present

## 2019-02-26 DIAGNOSIS — L309 Dermatitis, unspecified: Secondary | ICD-10-CM | POA: Diagnosis not present

## 2019-02-26 DIAGNOSIS — I10 Essential (primary) hypertension: Secondary | ICD-10-CM | POA: Diagnosis not present

## 2019-02-26 DIAGNOSIS — Z1389 Encounter for screening for other disorder: Secondary | ICD-10-CM | POA: Diagnosis not present

## 2019-03-07 DIAGNOSIS — B078 Other viral warts: Secondary | ICD-10-CM | POA: Diagnosis not present

## 2019-03-07 DIAGNOSIS — L218 Other seborrheic dermatitis: Secondary | ICD-10-CM | POA: Diagnosis not present

## 2019-03-07 DIAGNOSIS — L570 Actinic keratosis: Secondary | ICD-10-CM | POA: Diagnosis not present

## 2019-03-07 DIAGNOSIS — X32XXXA Exposure to sunlight, initial encounter: Secondary | ICD-10-CM | POA: Diagnosis not present

## 2019-03-07 DIAGNOSIS — L821 Other seborrheic keratosis: Secondary | ICD-10-CM | POA: Diagnosis not present

## 2019-03-08 DIAGNOSIS — R945 Abnormal results of liver function studies: Secondary | ICD-10-CM | POA: Diagnosis not present

## 2019-03-15 ENCOUNTER — Ambulatory Visit (INDEPENDENT_AMBULATORY_CARE_PROVIDER_SITE_OTHER): Payer: Medicare Other | Admitting: *Deleted

## 2019-03-15 DIAGNOSIS — Z5181 Encounter for therapeutic drug level monitoring: Secondary | ICD-10-CM | POA: Diagnosis not present

## 2019-03-15 DIAGNOSIS — Z952 Presence of prosthetic heart valve: Secondary | ICD-10-CM

## 2019-03-15 LAB — POCT INR: INR: 2 (ref 2.0–3.0)

## 2019-03-15 NOTE — Patient Instructions (Signed)
Continue coumadin 1 tablet daily except 1/2 tablet on Tuesdays and Fridays.  Recheck in 4 weeks

## 2019-03-26 ENCOUNTER — Telehealth: Payer: Self-pay | Admitting: *Deleted

## 2019-03-26 NOTE — Telephone Encounter (Signed)
Spoke with patient.  Husband passed away a couple of weeks ago.  She does not drive.  Told pt I am willing to work with her if she wants to get a home monitor.  She is going to check with her insurance company to see what coverage they provide then let me know. Plans to come to office for July INR check.  Will discuss more then.

## 2019-03-26 NOTE — Telephone Encounter (Signed)
Patient called stating that she would like to speak with Edrick Oh, RN in regards to a coumdin machine where she can check her coumdin due to she does not drive.

## 2019-04-02 ENCOUNTER — Other Ambulatory Visit: Payer: Self-pay | Admitting: Cardiovascular Disease

## 2019-04-02 DIAGNOSIS — Z79891 Long term (current) use of opiate analgesic: Secondary | ICD-10-CM | POA: Diagnosis not present

## 2019-04-02 DIAGNOSIS — G894 Chronic pain syndrome: Secondary | ICD-10-CM | POA: Diagnosis not present

## 2019-04-02 DIAGNOSIS — G4710 Hypersomnia, unspecified: Secondary | ICD-10-CM | POA: Diagnosis not present

## 2019-04-02 DIAGNOSIS — M545 Low back pain: Secondary | ICD-10-CM | POA: Diagnosis not present

## 2019-04-02 DIAGNOSIS — G4700 Insomnia, unspecified: Secondary | ICD-10-CM | POA: Diagnosis not present

## 2019-04-02 MED ORDER — METOPROLOL TARTRATE 50 MG PO TABS: ORAL_TABLET | ORAL | 0 refills | Status: DC

## 2019-04-02 NOTE — Telephone Encounter (Signed)
Metoprolol tartrate 50 mg (1/2 tablet daily ) refilled.

## 2019-04-02 NOTE — Telephone Encounter (Signed)
New message     *STAT* If patient is at the pharmacy, call can be transferred to refill team.   1. Which medications need to be refilled? (please list name of each medication and dose if known) Metoprolol Tartrate 50mg   2. Which pharmacy/location (including street and city if local pharmacy) is medication to be sent to? [facility] [phone] fax [phone]  3. Do they need a 30 day or 90 day supply? 90 day supply

## 2019-04-04 ENCOUNTER — Other Ambulatory Visit: Payer: Self-pay

## 2019-04-04 MED ORDER — WARFARIN SODIUM 5 MG PO TABS: ORAL_TABLET | ORAL | 1 refills | Status: DC

## 2019-04-04 MED ORDER — METOPROLOL TARTRATE 50 MG PO TABS: ORAL_TABLET | ORAL | 1 refills | Status: DC

## 2019-04-18 ENCOUNTER — Ambulatory Visit (INDEPENDENT_AMBULATORY_CARE_PROVIDER_SITE_OTHER): Payer: Medicare Other | Admitting: *Deleted

## 2019-04-18 ENCOUNTER — Other Ambulatory Visit: Payer: Self-pay

## 2019-04-18 DIAGNOSIS — Z952 Presence of prosthetic heart valve: Secondary | ICD-10-CM | POA: Diagnosis not present

## 2019-04-18 DIAGNOSIS — N183 Chronic kidney disease, stage 3 (moderate): Secondary | ICD-10-CM | POA: Diagnosis not present

## 2019-04-18 DIAGNOSIS — Z5181 Encounter for therapeutic drug level monitoring: Secondary | ICD-10-CM

## 2019-04-18 DIAGNOSIS — D649 Anemia, unspecified: Secondary | ICD-10-CM | POA: Diagnosis not present

## 2019-04-18 DIAGNOSIS — Z79899 Other long term (current) drug therapy: Secondary | ICD-10-CM | POA: Diagnosis not present

## 2019-04-18 DIAGNOSIS — R809 Proteinuria, unspecified: Secondary | ICD-10-CM | POA: Diagnosis not present

## 2019-04-18 DIAGNOSIS — I10 Essential (primary) hypertension: Secondary | ICD-10-CM | POA: Diagnosis not present

## 2019-04-18 DIAGNOSIS — E559 Vitamin D deficiency, unspecified: Secondary | ICD-10-CM | POA: Diagnosis not present

## 2019-04-18 LAB — POCT INR: INR: 3.7 — AB (ref 2.0–3.0)

## 2019-04-18 NOTE — Patient Instructions (Signed)
Hold coumadin tonight then resume 1 tablet daily except 1/2 tablet on Tuesdays and Fridays.  Recheck in 4 weeks

## 2019-05-16 ENCOUNTER — Encounter: Payer: Self-pay | Admitting: Cardiovascular Disease

## 2019-05-16 ENCOUNTER — Ambulatory Visit (INDEPENDENT_AMBULATORY_CARE_PROVIDER_SITE_OTHER): Payer: Medicare Other | Admitting: *Deleted

## 2019-05-16 ENCOUNTER — Other Ambulatory Visit: Payer: Self-pay

## 2019-05-16 ENCOUNTER — Ambulatory Visit (INDEPENDENT_AMBULATORY_CARE_PROVIDER_SITE_OTHER): Payer: Medicare Other | Admitting: Cardiovascular Disease

## 2019-05-16 VITALS — BP 189/79 | HR 69 | Temp 97.9°F | Ht 62.0 in | Wt 250.2 lb

## 2019-05-16 DIAGNOSIS — E7800 Pure hypercholesterolemia, unspecified: Secondary | ICD-10-CM | POA: Diagnosis not present

## 2019-05-16 DIAGNOSIS — Z952 Presence of prosthetic heart valve: Secondary | ICD-10-CM

## 2019-05-16 DIAGNOSIS — E1169 Type 2 diabetes mellitus with other specified complication: Secondary | ICD-10-CM | POA: Diagnosis not present

## 2019-05-16 DIAGNOSIS — E669 Obesity, unspecified: Secondary | ICD-10-CM | POA: Diagnosis not present

## 2019-05-16 DIAGNOSIS — I10 Essential (primary) hypertension: Secondary | ICD-10-CM | POA: Diagnosis not present

## 2019-05-16 DIAGNOSIS — Z5181 Encounter for therapeutic drug level monitoring: Secondary | ICD-10-CM | POA: Diagnosis not present

## 2019-05-16 LAB — POCT INR: INR: 3.7 — AB (ref 2.0–3.0)

## 2019-05-16 NOTE — Patient Instructions (Signed)

## 2019-05-16 NOTE — Patient Instructions (Signed)
Hold coumadin tonight then decrease dose to 1 tablet daily except 1/2 tablet on Mondays, Wednesdays and Fridays.  Recheck in 4 weeks

## 2019-05-16 NOTE — Progress Notes (Signed)
Cardiology Office Note    Date:  05/19/2019   ID:  [name], DOB December 03, 1950, MRN [id number]  PCP:  Redmond School, MD  Cardiologist:   Sanda Klein, MD   Chief complaint: Follow-up mechanical aortic valve replacement  History of Present Illness:  [name] is a 68 y.o. female here for follow-up of mechanical aortic valve replacement and ascending aortic aneurysm repair Deneen Harts procedure, 2011 , 21 mm valve) performed for bicuspid aortic valve with critical aortic stenosis. Additional problems include hypertension, diabetes mellitus, mixed hyperlipidemia in the setting of morbid obesity.  From a medical point of view she is done well without hospitalizations or serious illness, but she is mourning the loss of her husband.  He passed away in March 19, 2023.  Although he had severe COPD his death was unexpected and abrupt.  She still living in the same house.  She is clearly still grieving.   Her last echocardiogram was performed in January 2019 and showed normal gradients for the 21 mm mechanical aortic valve, albeit a little higher than they had been 5 years earlier.  Prothrombin time monitoring is performed in [place].    Her blood pressure is a little high today.  She reports that her nephrologist Dr. Lowanda Foster stopped her lisinopril about a week ago, presumably due to abnormalities in kidney function.  The most recent lab I have available is a creatinine of 1.38 from July 29.  The patient specifically denies any chest pain at rest exertion, dyspnea at rest or with exertion, orthopnea, paroxysmal nocturnal dyspnea, syncope, palpitations, focal neurological deficits, intermittent claudication, lower extremity edema, unexplained weight gain, cough, hemoptysis or wheezing.   Past Medical History:  Diagnosis Date  . AI (aortic insufficiency)   . Arthritis   . AS (aortic stenosis) 09/18/2012   2D Echo EF 55%-60%  . Chronic kidney disease    renal insuffcentily  .  Coronary artery disease   . Cough   . Diabetes mellitus ([name])   . Gout    right foot  . Hyperlipidemia   . Hypertension    dr Donetta Potts  . Obese   . PONV (postoperative nausea and vomiting)    with early surgeries , none recent  . Renal insufficiency   . Tricuspid regurgitation     Past Surgical History:  Procedure Laterality Date  . AORTIC VALVE REPLACEMENT  04/01/2010   21-mm Sr Jude Regent ;mechanical valve   . BACK SURGERY    . broken arm     wire in lt elbow  . CARDIAC CATHETERIZATION  02/09/2010  . FRACTURE SURGERY    . GANGLION CYST EXCISION Left 01/08/2016   Procedure: LEFT WRIST EXCISION OF VOLAR CYST, EXCISION OF MUCOID TUMOR;  Surgeon: Daryll Brod, MD;  Location: [facility];  Service: Orthopedics;  Laterality: Left;  . LUMBAR LAMINECTOMY/DECOMPRESSION MICRODISCECTOMY N/A 02/28/2013   Procedure: LUMBAR LAMINECTOMY/DECOMPRESSION MICRODISCECTOMY 2 LEVELS;  Surgeon: Ophelia Charter, MD;  Location: [facility];  Service: Neurosurgery;  Laterality: N/A;  Lumbar three-four and Lumbar four-five laminectomies  . MASS EXCISION Left 01/08/2016   Procedure: EXCISION MUCOID TUMOR LEFT INDEX FINGER DISTAL INTERPHALANGEAL JOINT;  Surgeon: Daryll Brod, MD;  Location: [facility];  Service: Orthopedics;  Laterality: Left;  . replacement  of ascending aortic aneurysm  04/01/2010   28-mm supracoronary Hemashield tube graft using deep hypothermic circulatory arrest    Current Medications: Outpatient Medications Prior to Visit  Medication Sig Dispense Refill  . allopurinol (ZYLOPRIM) 100  MG tablet Take 100 mg by mouth daily.    Marland Kitchen ALPRAZolam (XANAX) 0.5 MG tablet Take 0.5 mg by mouth at bedtime as needed for sleep.     Marland Kitchen b complex vitamins tablet Take 1 tablet by mouth daily.    . Biotin 10000 MCG TABS Take 2,000 mg by mouth daily at 3 pm.    . gabapentin (NEURONTIN) 300 MG capsule Take 300 mg by mouth daily.    Marland Kitchen glimepiride (AMARYL) 2 MG tablet Take 2 mg by  mouth daily before breakfast.    . lisinopril (PRINIVIL,ZESTRIL) 10 MG tablet Take 10 mg by mouth daily.    . metoprolol tartrate (LOPRESSOR) 50 MG tablet Take 1/2 (one-half) tablet by mouth twice daily 45 tablet 1  . Multiple Vitamin (MULTIVITAMIN WITH MINERALS) TABS Take 1 tablet by mouth daily.    . multivitamin-lutein (OCUVITE-LUTEIN) CAPS Take 1 capsule by mouth daily.    . Omega-3 Fatty Acids (FISH OIL) 1000 MG CAPS Take 1,000 mg by mouth daily.    . pravastatin (PRAVACHOL) 80 MG tablet Take 80 mg by mouth daily.    Marland Kitchen warfarin (COUMADIN) 5 MG tablet TAKE 1 TO 1 & 1/2 (ONE & ONE-HALF) TABLETS BY MOUTH ONCE DAILY AS DIRECTED BY  COUMADIN  CLINIC 120 tablet 1  . zolpidem (AMBIEN) 10 MG tablet Take 10 mg by mouth at bedtime.     . cetirizine (ZYRTEC) 10 MG tablet Take 10 mg by mouth daily.    Marland Kitchen HYDROcodone-acetaminophen (NORCO/VICODIN) 5-325 MG tablet Take 1 tablet by mouth every 8 (eight) hours as needed.    . topiramate (TOPAMAX) 25 MG tablet Take 25 mg by mouth 2 (two) times daily.     No facility-administered medications prior to visit.      Allergies:   Morphine and related   Social History   Socioeconomic History  . Marital status: Married    Spouse name: Not on file  . Number of children: Not on file  . Years of education: Not on file  . Highest education level: Not on file  Occupational History  . Not on file  Social Needs  . Financial resource strain: Not on file  . Food insecurity    Worry: Not on file    Inability: Not on file  . Transportation needs    Medical: Not on file    Non-medical: Not on file  Tobacco Use  . Smoking status: Never Smoker  . Smokeless tobacco: Never Used  Substance and Sexual Activity  . Alcohol use: No  . Drug use: No  . Sexual activity: Not on file  Lifestyle  . Physical activity    Days per week: Not on file    Minutes per session: Not on file  . Stress: Not on file  Relationships  . Social Herbalist on phone: Not on  file    Gets together: Not on file    Attends religious service: Not on file    Active member of club or organization: Not on file    Attends meetings of clubs or organizations: Not on file    Relationship status: Not on file  Other Topics Concern  . Not on file  Social History Narrative  . Not on file     Family History:  The patient's family history includes CAD in an other family member; Cancer - Lung in her maternal grandfather; Cancer - Prostate in her father; Diabetes in her mother and another family member;  Hypertension in an other family member.   ROS:   Please see the history of present illness.    All other systems are reviewed and are negative  PHYSICAL EXAM:   VS:  BP (!) 189/79   Pulse 69   Temp 97.9 F (36.6 C)   Ht _0  (1.575 m)   Wt 250 lb 3.2 oz (113.5 kg)   SpO2 99%   BMI 45.76 kg/m      General: Alert, oriented x3, no distress, morbidly obese Head: no evidence of trauma, PERRL, EOMI, no exophtalmos or lid lag, no myxedema, no xanthelasma; normal ears, nose and oropharynx Neck: normal jugular venous pulsations and no hepatojugular reflux; brisk carotid pulses without delay and no carotid bruits Chest: clear to auscultation, no signs of consolidation by percussion or palpation, normal fremitus, symmetrical and full respiratory excursions Cardiovascular: normal position and quality of the apical impulse, regular rhythm, normal first and second heart sounds with a very crisp prosthetic valve sounds, 2/6 early peaking systolic ejection murmur in the aortic focus, no diastolic murmurs, rubs or gallops Abdomen: no tenderness or distention, no masses by palpation, no abnormal pulsatility or arterial bruits, normal bowel sounds, no hepatosplenomegaly Extremities: no clubbing, cyanosis or edema; 2+ radial, ulnar and brachial pulses bilaterally; 2+ right femoral, posterior tibial and dorsalis pedis pulses; 2+ left femoral, posterior tibial and dorsalis pedis pulses; no  subclavian or femoral bruits Neurological: grossly nonfocal Psych: Normal mood and affect    Wt Readings from Last 3 Encounters:  05/16/19 250 lb 3.2 oz (113.5 kg)  10/23/18 255 lb 9.6 oz (115.9 kg)  10/13/17 257 lb (116.6 kg)      Studies/Labs Reviewed:   ECHO 10/13/2017:   - Left ventricle: The cavity size was normal. There was moderate concentric hypertrophy. Systolic function was normal. The   estimated ejection fraction was in the range of 55% to 60%. Wall motion was normal; there were no regional wall motion   abnormalities. There was an increased relative contribution of atrial contraction to ventricular filling. Doppler parameters are   consistent with abnormal left ventricular relaxation (grade 1 diastolic dysfunction). - Aortic valve: There was very mild stenosis. Mean gradient (S): 13 mm Hg. - Aorta: s/p Bentall procedures with normal aortic root and ascending aortic dimensions. - Mitral valve: Calcified annulus. - Left atrium: The atrium was mildly dilated. - Right ventricle: The cavity size was mildly dilated. Wall thickness was normal. - Pulmonary arteries: PA peak pressure: 39 mm Hg (S).  Impressions:  - Moderate LVH, normal LVF, mild TR, MAC, mild TR, mild PHT with PASP estimated at 80mHg, G1DD, mild RVE, S/P Bentall with normal aortic root and ascending aortic dimensions, calcified poorly visualized AV with very mild AS. The right ventricular systolic pressure was increased consistent with mild pulmonary hypertension.  EKG:  EKG is not ordered today.  ECG from October 23, 2018 shows sinus rhythm and T wave inversion in leads I and aVL similar with previous tracings.  Recent Labs: June 05, 2018 Hemoglobin 13.5, normal iron studies except high ferritin creatinine 0.91 (improved), BUN 15 , calcium 9.9, potassium 4.5 Normalized intact PTH 49  February 23, 2019  hemoglobin 13.8, ALT 57, TSH 1.03, hemoglobin A1c 6.5% Lipid Panel February 2017 total cholesterol  269, triglycerides 223, HDL 57, LDL 167  February 23, 2019 total cholesterol 207, HDL 57, LDL 114, triglycerides 182  ASSESSMENT:    1. H/O mechanical aortic valve replacement   2. Morbid obesity ([place]  3. Hypercholesterolemia   4. Diabetes mellitus type 2 in obese ([place])   5. Essential hypertension   6. Hypercalcemia      PLAN:  In order of problems listed above:  1. S/P AVR: No clinical abnormalities and normal physical exam for her type of prosthesis.  Appropriate anticoagulation.  Check echo every 3 years or so unless she develops new symptoms 2. Obesity: Clearly she is not a morning, but I gently encouraged her to remember that weight loss is an important long-term goal. 3. HLP: She did not have coronary atherosclerosis on presurgical cardiac catheterization.  She is taking a week statin at maximum dose.  Although her cholesterol has improved from last year, her LDL is still above the target of 100.  She did not tolerate more potent agents. 4. DM: Well-controlled based on recent hemoglobin A1c 5. HTN: Blood pressure is quite high today.  Target BP 130/80 or less especially in the setting of diabetes.  She stopped her ACE inhibitor at the advice of her nephrologist.  She had follow-up labs performed a few days ago that I do not yet have access to.  I did not change any of her medications. 6. HyperCa: History of hypercalcemia, hyperparathyroidism, avoid thiazide diuretics.    Medication Adjustments/Labs and Tests Ordered: Current medicines are reviewed at length with the patient today.  Concerns regarding medicines are outlined above.  Medication changes, Labs and Tests ordered today are listed in the Patient Instructions below. Patient Instructions  Medication Instructions:  Your physician recommends that you continue on your current medications as directed. Please refer to the Current Medication list given to you today.  If you need a refill on your cardiac medications before your  next appointment, please call your pharmacy.   Lab work: None ordered If you have labs (blood work) drawn today and your tests are completely normal, you will receive your results only by: [place] (if you have MyChart) OR A paper copy in the mail If you have any lab test that is abnormal or we need to change your treatment, we will call you to review the results.  Testing/Procedures: None ordered  Follow-Up: At [facility], you and your health needs are our priority.  As part of our continuing mission to provide you with exceptional heart care, we have created designated Provider Care Teams.  These Care Teams include your primary Cardiologist (physician) and Advanced Practice Providers (APPs -  Physician Assistants and Nurse Practitioners) who all work together to provide you with the care you need, when you need it. You will need a follow up appointment in 12 months.  Please call our office 2 months in advance to schedule this appointment.  You may see Sanda Klein, MD or one of the following Advanced Practice Providers on your designated Care Team: Almyra Deforest, PA-C Fabian Sharp, [place]          Signed, Sanda Klein, MD  05/19/2019 4:30 PM    [address], [place], [place]  [address] Phone: [phone]; Fax: [phone]

## 2019-05-19 ENCOUNTER — Encounter: Payer: Self-pay | Admitting: Cardiovascular Disease

## 2019-06-13 ENCOUNTER — Ambulatory Visit (INDEPENDENT_AMBULATORY_CARE_PROVIDER_SITE_OTHER): Payer: Medicare Other | Admitting: *Deleted

## 2019-06-13 ENCOUNTER — Other Ambulatory Visit: Payer: Self-pay

## 2019-06-13 DIAGNOSIS — Z952 Presence of prosthetic heart valve: Secondary | ICD-10-CM

## 2019-06-13 DIAGNOSIS — D649 Anemia, unspecified: Secondary | ICD-10-CM | POA: Diagnosis not present

## 2019-06-13 DIAGNOSIS — E559 Vitamin D deficiency, unspecified: Secondary | ICD-10-CM | POA: Diagnosis not present

## 2019-06-13 DIAGNOSIS — R809 Proteinuria, unspecified: Secondary | ICD-10-CM | POA: Diagnosis not present

## 2019-06-13 DIAGNOSIS — Z79899 Other long term (current) drug therapy: Secondary | ICD-10-CM | POA: Diagnosis not present

## 2019-06-13 DIAGNOSIS — Z5181 Encounter for therapeutic drug level monitoring: Secondary | ICD-10-CM

## 2019-06-13 DIAGNOSIS — I10 Essential (primary) hypertension: Secondary | ICD-10-CM | POA: Diagnosis not present

## 2019-06-13 DIAGNOSIS — N183 Chronic kidney disease, stage 3 (moderate): Secondary | ICD-10-CM | POA: Diagnosis not present

## 2019-06-13 LAB — POCT INR: INR: 3.4 — AB (ref 2.0–3.0)

## 2019-06-13 NOTE — Patient Instructions (Signed)
Hold coumadin tonight then decrease dose to 1/2 tablet daily except 1 tablet on Tuesdays, Thursdays and Saturdays.  Recheck in 4 weeks

## 2019-06-20 DIAGNOSIS — F419 Anxiety disorder, unspecified: Secondary | ICD-10-CM | POA: Diagnosis not present

## 2019-06-20 DIAGNOSIS — N183 Chronic kidney disease, stage 3 (moderate): Secondary | ICD-10-CM | POA: Diagnosis not present

## 2019-06-20 DIAGNOSIS — I10 Essential (primary) hypertension: Secondary | ICD-10-CM | POA: Diagnosis not present

## 2019-06-20 DIAGNOSIS — E1129 Type 2 diabetes mellitus with other diabetic kidney complication: Secondary | ICD-10-CM | POA: Diagnosis not present

## 2019-06-20 DIAGNOSIS — G894 Chronic pain syndrome: Secondary | ICD-10-CM | POA: Diagnosis not present

## 2019-06-25 DIAGNOSIS — M25569 Pain in unspecified knee: Secondary | ICD-10-CM | POA: Diagnosis not present

## 2019-06-25 DIAGNOSIS — Z79891 Long term (current) use of opiate analgesic: Secondary | ICD-10-CM | POA: Diagnosis not present

## 2019-06-25 DIAGNOSIS — G4700 Insomnia, unspecified: Secondary | ICD-10-CM | POA: Diagnosis not present

## 2019-06-25 DIAGNOSIS — M545 Low back pain: Secondary | ICD-10-CM | POA: Diagnosis not present

## 2019-07-11 ENCOUNTER — Other Ambulatory Visit: Payer: Self-pay

## 2019-07-11 ENCOUNTER — Ambulatory Visit (INDEPENDENT_AMBULATORY_CARE_PROVIDER_SITE_OTHER): Payer: Medicare Other | Admitting: *Deleted

## 2019-07-11 DIAGNOSIS — Z952 Presence of prosthetic heart valve: Secondary | ICD-10-CM

## 2019-07-11 DIAGNOSIS — Z5181 Encounter for therapeutic drug level monitoring: Secondary | ICD-10-CM | POA: Diagnosis not present

## 2019-07-11 LAB — POCT INR: INR: 2 (ref 2.0–3.0)

## 2019-07-11 NOTE — Patient Instructions (Signed)
Continue coumadin 1/2 tablet daily except 1 tablet on Tuesdays, Thursdays and Saturdays.  Recheck in 5 weeks

## 2019-08-01 ENCOUNTER — Other Ambulatory Visit (HOSPITAL_COMMUNITY)
Admission: RE | Admit: 2019-08-01 | Discharge: 2019-08-01 | Disposition: A | Discharge status: Home / Self Care | Payer: Medicare Other | Source: Ambulatory Visit | Attending: Nephrology | Admitting: Nephrology

## 2019-08-01 DIAGNOSIS — N184 Chronic kidney disease, stage 4 (severe): Secondary | ICD-10-CM | POA: Insufficient documentation

## 2019-08-01 DIAGNOSIS — E559 Vitamin D deficiency, unspecified: Secondary | ICD-10-CM | POA: Insufficient documentation

## 2019-08-01 DIAGNOSIS — I10 Essential (primary) hypertension: Secondary | ICD-10-CM | POA: Diagnosis not present

## 2019-08-01 DIAGNOSIS — D631 Anemia in chronic kidney disease: Secondary | ICD-10-CM | POA: Insufficient documentation

## 2019-08-01 DIAGNOSIS — Z79899 Other long term (current) drug therapy: Secondary | ICD-10-CM | POA: Insufficient documentation

## 2019-08-01 LAB — RENAL FUNCTION PANEL
Albumin: 4 g/dL (ref 3.5–5.0)
Anion gap: 9 (ref 5–15)
BUN: 19 mg/dL (ref 8–23)
CO2: 24 mmol/L (ref 22–32)
Calcium: 9.9 mg/dL (ref 8.9–10.3)
Chloride: 101 mmol/L (ref 98–111)
Creatinine, Ser: 1.02 mg/dL — ABNORMAL HIGH (ref 0.44–1.00)
GFR calc Af Amer: >60 mL/min (ref >60)
GFR calc non Af Amer: 56 mL/min — ABNORMAL LOW (ref >60)
Glucose, Bld: 107 mg/dL — ABNORMAL HIGH (ref 70–99)
Phosphorus: 3 mg/dL (ref 2.5–4.6)
Potassium: 3.9 mmol/L (ref 3.5–5.1)
Sodium: 134 mmol/L — ABNORMAL LOW (ref 135–145)

## 2019-08-01 LAB — HEMOGLOBIN AND HEMATOCRIT, BLOOD
HCT: 44.2 % (ref 36.0–46.0)
Hemoglobin: 14.2 g/dL (ref 12.0–15.0)

## 2019-08-01 LAB — PROTEIN / CREATININE RATIO, URINE
Creatinine, Urine: 40.89 mg/dL
Total Protein, Urine: <6 mg/dL

## 2019-08-01 LAB — VITAMIN D 25 HYDROXY (VIT D DEFICIENCY, FRACTURES): Vit D, 25-Hydroxy: 27.02 ng/mL — ABNORMAL LOW (ref 30–100)

## 2019-08-01 LAB — IRON: Iron: 97 ug/dL (ref 28–170)

## 2019-08-02 DIAGNOSIS — Z6841 Body Mass Index (BMI) 40.0 and over, adult: Secondary | ICD-10-CM | POA: Diagnosis not present

## 2019-08-02 DIAGNOSIS — N1831 Chronic kidney disease, stage 3a: Secondary | ICD-10-CM | POA: Diagnosis not present

## 2019-08-02 DIAGNOSIS — E1129 Type 2 diabetes mellitus with other diabetic kidney complication: Secondary | ICD-10-CM | POA: Diagnosis not present

## 2019-08-02 DIAGNOSIS — E559 Vitamin D deficiency, unspecified: Secondary | ICD-10-CM | POA: Diagnosis not present

## 2019-08-02 DIAGNOSIS — E871 Hypo-osmolality and hyponatremia: Secondary | ICD-10-CM | POA: Diagnosis not present

## 2019-08-02 DIAGNOSIS — E211 Secondary hyperparathyroidism, not elsewhere classified: Secondary | ICD-10-CM | POA: Diagnosis not present

## 2019-08-02 DIAGNOSIS — R809 Proteinuria, unspecified: Secondary | ICD-10-CM | POA: Diagnosis not present

## 2019-08-02 LAB — PTH, INTACT AND CALCIUM
Calcium, Total (PTH): 9.9 mg/dL (ref 8.7–10.3)
PTH: 81 pg/mL — ABNORMAL HIGH (ref 15–65)

## 2019-08-07 ENCOUNTER — Other Ambulatory Visit: Payer: Self-pay | Admitting: Cardiovascular Disease

## 2019-08-15 ENCOUNTER — Other Ambulatory Visit: Payer: Self-pay

## 2019-08-15 ENCOUNTER — Ambulatory Visit (INDEPENDENT_AMBULATORY_CARE_PROVIDER_SITE_OTHER): Payer: Medicare Other | Admitting: *Deleted

## 2019-08-15 DIAGNOSIS — Z5181 Encounter for therapeutic drug level monitoring: Secondary | ICD-10-CM

## 2019-08-15 DIAGNOSIS — Z952 Presence of prosthetic heart valve: Secondary | ICD-10-CM | POA: Diagnosis not present

## 2019-08-15 LAB — POCT INR: INR: 2.9 (ref 2.0–3.0)

## 2019-08-15 NOTE — Patient Instructions (Signed)
Continue coumadin 1/2 tablet daily except 1 tablet on Tuesdays, Thursdays and Saturdays.  Recheck in 5 weeks

## 2019-08-20 DIAGNOSIS — E7849 Other hyperlipidemia: Secondary | ICD-10-CM | POA: Diagnosis not present

## 2019-08-20 DIAGNOSIS — I129 Hypertensive chronic kidney disease with stage 1 through stage 4 chronic kidney disease, or unspecified chronic kidney disease: Secondary | ICD-10-CM | POA: Diagnosis not present

## 2019-08-20 DIAGNOSIS — N1831 Chronic kidney disease, stage 3a: Secondary | ICD-10-CM | POA: Diagnosis not present

## 2019-08-20 DIAGNOSIS — E1122 Type 2 diabetes mellitus with diabetic chronic kidney disease: Secondary | ICD-10-CM | POA: Diagnosis not present

## 2019-08-29 ENCOUNTER — Other Ambulatory Visit: Payer: Self-pay | Admitting: Cardiovascular Disease

## 2019-09-19 ENCOUNTER — Other Ambulatory Visit: Payer: Self-pay

## 2019-09-19 ENCOUNTER — Ambulatory Visit (INDEPENDENT_AMBULATORY_CARE_PROVIDER_SITE_OTHER): Payer: Medicare Other | Admitting: *Deleted

## 2019-09-19 DIAGNOSIS — Z5181 Encounter for therapeutic drug level monitoring: Secondary | ICD-10-CM

## 2019-09-19 DIAGNOSIS — Z952 Presence of prosthetic heart valve: Secondary | ICD-10-CM

## 2019-09-19 LAB — POCT INR: INR: 1.9 — AB (ref 2.0–3.0)

## 2019-09-19 NOTE — Patient Instructions (Signed)
Take warfarin 1 tablet tonight then resume 1/2 tablet daily except 1 tablet on Tuesdays, Thursdays and Saturdays.  Recheck in 5 weeks

## 2019-09-20 DIAGNOSIS — E1122 Type 2 diabetes mellitus with diabetic chronic kidney disease: Secondary | ICD-10-CM | POA: Diagnosis not present

## 2019-09-20 DIAGNOSIS — I350 Nonrheumatic aortic (valve) stenosis: Secondary | ICD-10-CM | POA: Diagnosis not present

## 2019-09-20 DIAGNOSIS — G894 Chronic pain syndrome: Secondary | ICD-10-CM | POA: Diagnosis not present

## 2019-09-20 DIAGNOSIS — F4542 Pain disorder with related psychological factors: Secondary | ICD-10-CM | POA: Diagnosis not present

## 2019-09-26 DIAGNOSIS — Z79891 Long term (current) use of opiate analgesic: Secondary | ICD-10-CM | POA: Diagnosis not present

## 2019-09-26 DIAGNOSIS — G4700 Insomnia, unspecified: Secondary | ICD-10-CM | POA: Diagnosis not present

## 2019-09-26 DIAGNOSIS — M545 Low back pain: Secondary | ICD-10-CM | POA: Diagnosis not present

## 2019-10-21 DIAGNOSIS — E1122 Type 2 diabetes mellitus with diabetic chronic kidney disease: Secondary | ICD-10-CM | POA: Diagnosis not present

## 2019-10-21 DIAGNOSIS — I350 Nonrheumatic aortic (valve) stenosis: Secondary | ICD-10-CM | POA: Diagnosis not present

## 2019-10-21 DIAGNOSIS — N1831 Chronic kidney disease, stage 3a: Secondary | ICD-10-CM | POA: Diagnosis not present

## 2019-10-21 DIAGNOSIS — I129 Hypertensive chronic kidney disease with stage 1 through stage 4 chronic kidney disease, or unspecified chronic kidney disease: Secondary | ICD-10-CM | POA: Diagnosis not present

## 2019-10-24 ENCOUNTER — Ambulatory Visit (INDEPENDENT_AMBULATORY_CARE_PROVIDER_SITE_OTHER): Payer: Medicare Other | Admitting: *Deleted

## 2019-10-24 ENCOUNTER — Other Ambulatory Visit: Payer: Self-pay

## 2019-10-24 DIAGNOSIS — Z952 Presence of prosthetic heart valve: Secondary | ICD-10-CM

## 2019-10-24 DIAGNOSIS — G4709 Other insomnia: Secondary | ICD-10-CM | POA: Diagnosis not present

## 2019-10-24 DIAGNOSIS — Z5181 Encounter for therapeutic drug level monitoring: Secondary | ICD-10-CM

## 2019-10-24 LAB — POCT INR: INR: 2.2 (ref 2.0–3.0)

## 2019-10-24 NOTE — Patient Instructions (Signed)
Continue warfarin 1/2 tablet daily except 1 tablet on Tuesdays, Thursdays and Saturdays.  Recheck in 6 weeks

## 2019-11-05 DIAGNOSIS — Z79899 Other long term (current) drug therapy: Secondary | ICD-10-CM | POA: Diagnosis not present

## 2019-11-05 DIAGNOSIS — E559 Vitamin D deficiency, unspecified: Secondary | ICD-10-CM | POA: Diagnosis not present

## 2019-11-05 DIAGNOSIS — N184 Chronic kidney disease, stage 4 (severe): Secondary | ICD-10-CM | POA: Diagnosis not present

## 2019-11-05 DIAGNOSIS — R809 Proteinuria, unspecified: Secondary | ICD-10-CM | POA: Diagnosis not present

## 2019-11-05 DIAGNOSIS — D631 Anemia in chronic kidney disease: Secondary | ICD-10-CM | POA: Diagnosis not present

## 2019-11-07 DIAGNOSIS — Z6841 Body Mass Index (BMI) 40.0 and over, adult: Secondary | ICD-10-CM | POA: Diagnosis not present

## 2019-11-07 DIAGNOSIS — E211 Secondary hyperparathyroidism, not elsewhere classified: Secondary | ICD-10-CM | POA: Diagnosis not present

## 2019-11-07 DIAGNOSIS — E1129 Type 2 diabetes mellitus with other diabetic kidney complication: Secondary | ICD-10-CM | POA: Diagnosis not present

## 2019-11-07 DIAGNOSIS — R809 Proteinuria, unspecified: Secondary | ICD-10-CM | POA: Diagnosis not present

## 2019-11-07 DIAGNOSIS — I129 Hypertensive chronic kidney disease with stage 1 through stage 4 chronic kidney disease, or unspecified chronic kidney disease: Secondary | ICD-10-CM | POA: Diagnosis not present

## 2019-11-07 DIAGNOSIS — E871 Hypo-osmolality and hyponatremia: Secondary | ICD-10-CM | POA: Diagnosis not present

## 2019-11-07 DIAGNOSIS — I5032 Chronic diastolic (congestive) heart failure: Secondary | ICD-10-CM | POA: Diagnosis not present

## 2019-11-07 DIAGNOSIS — N189 Chronic kidney disease, unspecified: Secondary | ICD-10-CM | POA: Diagnosis not present

## 2019-11-07 DIAGNOSIS — E559 Vitamin D deficiency, unspecified: Secondary | ICD-10-CM | POA: Diagnosis not present

## 2019-11-07 DIAGNOSIS — E1122 Type 2 diabetes mellitus with diabetic chronic kidney disease: Secondary | ICD-10-CM | POA: Diagnosis not present

## 2019-11-07 DIAGNOSIS — Z79899 Other long term (current) drug therapy: Secondary | ICD-10-CM | POA: Diagnosis not present

## 2019-11-15 DIAGNOSIS — Z23 Encounter for immunization: Secondary | ICD-10-CM | POA: Diagnosis not present

## 2019-11-18 DIAGNOSIS — I129 Hypertensive chronic kidney disease with stage 1 through stage 4 chronic kidney disease, or unspecified chronic kidney disease: Secondary | ICD-10-CM | POA: Diagnosis not present

## 2019-11-18 DIAGNOSIS — I350 Nonrheumatic aortic (valve) stenosis: Secondary | ICD-10-CM | POA: Diagnosis not present

## 2019-11-18 DIAGNOSIS — E1122 Type 2 diabetes mellitus with diabetic chronic kidney disease: Secondary | ICD-10-CM | POA: Diagnosis not present

## 2019-11-18 DIAGNOSIS — N1831 Chronic kidney disease, stage 3a: Secondary | ICD-10-CM | POA: Diagnosis not present

## 2019-12-05 ENCOUNTER — Ambulatory Visit (INDEPENDENT_AMBULATORY_CARE_PROVIDER_SITE_OTHER): Payer: Medicare Other | Admitting: *Deleted

## 2019-12-05 ENCOUNTER — Other Ambulatory Visit: Payer: Self-pay

## 2019-12-05 DIAGNOSIS — Z5181 Encounter for therapeutic drug level monitoring: Secondary | ICD-10-CM | POA: Diagnosis not present

## 2019-12-05 DIAGNOSIS — Z952 Presence of prosthetic heart valve: Secondary | ICD-10-CM

## 2019-12-05 LAB — POCT INR: INR: 1.8 — AB (ref 2.0–3.0)

## 2019-12-05 NOTE — Patient Instructions (Signed)
Take warfarin 1 tablet tonight then resume 1/2 tablet daily except 1 tablet on Tuesdays, Thursdays and Saturdays.  Recheck in 6 weeks

## 2019-12-14 DIAGNOSIS — Z23 Encounter for immunization: Secondary | ICD-10-CM | POA: Diagnosis not present

## 2019-12-19 DIAGNOSIS — N1831 Chronic kidney disease, stage 3a: Secondary | ICD-10-CM | POA: Diagnosis not present

## 2019-12-19 DIAGNOSIS — E1122 Type 2 diabetes mellitus with diabetic chronic kidney disease: Secondary | ICD-10-CM | POA: Diagnosis not present

## 2019-12-19 DIAGNOSIS — I350 Nonrheumatic aortic (valve) stenosis: Secondary | ICD-10-CM | POA: Diagnosis not present

## 2019-12-19 DIAGNOSIS — I129 Hypertensive chronic kidney disease with stage 1 through stage 4 chronic kidney disease, or unspecified chronic kidney disease: Secondary | ICD-10-CM | POA: Diagnosis not present

## 2019-12-26 DIAGNOSIS — G4700 Insomnia, unspecified: Secondary | ICD-10-CM | POA: Diagnosis not present

## 2019-12-26 DIAGNOSIS — G4710 Hypersomnia, unspecified: Secondary | ICD-10-CM | POA: Diagnosis not present

## 2019-12-26 DIAGNOSIS — Z79891 Long term (current) use of opiate analgesic: Secondary | ICD-10-CM | POA: Diagnosis not present

## 2019-12-26 DIAGNOSIS — M545 Low back pain: Secondary | ICD-10-CM | POA: Diagnosis not present

## 2019-12-26 DIAGNOSIS — E669 Obesity, unspecified: Secondary | ICD-10-CM | POA: Diagnosis not present

## 2019-12-26 DIAGNOSIS — G894 Chronic pain syndrome: Secondary | ICD-10-CM | POA: Diagnosis not present

## 2019-12-26 DIAGNOSIS — M25569 Pain in unspecified knee: Secondary | ICD-10-CM | POA: Diagnosis not present

## 2019-12-26 DIAGNOSIS — M461 Sacroiliitis, not elsewhere classified: Secondary | ICD-10-CM | POA: Diagnosis not present

## 2020-01-03 ENCOUNTER — Other Ambulatory Visit (HOSPITAL_COMMUNITY): Payer: Self-pay | Admitting: Internal Medicine

## 2020-01-03 DIAGNOSIS — Z1231 Encounter for screening mammogram for malignant neoplasm of breast: Secondary | ICD-10-CM

## 2020-01-15 ENCOUNTER — Other Ambulatory Visit: Payer: Self-pay

## 2020-01-15 ENCOUNTER — Ambulatory Visit (INDEPENDENT_AMBULATORY_CARE_PROVIDER_SITE_OTHER): Payer: Medicare Other | Admitting: *Deleted

## 2020-01-15 DIAGNOSIS — Z5181 Encounter for therapeutic drug level monitoring: Secondary | ICD-10-CM

## 2020-01-15 DIAGNOSIS — Z952 Presence of prosthetic heart valve: Secondary | ICD-10-CM | POA: Diagnosis not present

## 2020-01-15 LAB — POCT INR: INR: 1.6 — AB (ref 2.0–3.0)

## 2020-01-15 NOTE — Patient Instructions (Signed)
Increase warfarin to 1 tablet daily except 1/2 tablet on Mondays and Thursdays Recheck in 3 wks

## 2020-01-18 DIAGNOSIS — E1122 Type 2 diabetes mellitus with diabetic chronic kidney disease: Secondary | ICD-10-CM | POA: Diagnosis not present

## 2020-01-18 DIAGNOSIS — I350 Nonrheumatic aortic (valve) stenosis: Secondary | ICD-10-CM | POA: Diagnosis not present

## 2020-01-18 DIAGNOSIS — I129 Hypertensive chronic kidney disease with stage 1 through stage 4 chronic kidney disease, or unspecified chronic kidney disease: Secondary | ICD-10-CM | POA: Diagnosis not present

## 2020-01-18 DIAGNOSIS — N1831 Chronic kidney disease, stage 3a: Secondary | ICD-10-CM | POA: Diagnosis not present

## 2020-01-28 ENCOUNTER — Other Ambulatory Visit: Payer: Self-pay

## 2020-01-28 ENCOUNTER — Ambulatory Visit (HOSPITAL_COMMUNITY)
Admission: RE | Admit: 2020-01-28 | Discharge: 2020-01-28 | Disposition: A | Discharge status: Home / Self Care | Payer: Medicare Other | Source: Ambulatory Visit | Attending: Internal Medicine | Admitting: Internal Medicine

## 2020-01-28 DIAGNOSIS — Z1231 Encounter for screening mammogram for malignant neoplasm of breast: Secondary | ICD-10-CM | POA: Diagnosis not present

## 2020-01-31 DIAGNOSIS — R809 Proteinuria, unspecified: Secondary | ICD-10-CM | POA: Diagnosis not present

## 2020-01-31 DIAGNOSIS — Z79899 Other long term (current) drug therapy: Secondary | ICD-10-CM | POA: Diagnosis not present

## 2020-01-31 DIAGNOSIS — Z6841 Body Mass Index (BMI) 40.0 and over, adult: Secondary | ICD-10-CM | POA: Diagnosis not present

## 2020-01-31 DIAGNOSIS — I129 Hypertensive chronic kidney disease with stage 1 through stage 4 chronic kidney disease, or unspecified chronic kidney disease: Secondary | ICD-10-CM | POA: Diagnosis not present

## 2020-01-31 DIAGNOSIS — E1129 Type 2 diabetes mellitus with other diabetic kidney complication: Secondary | ICD-10-CM | POA: Diagnosis not present

## 2020-01-31 DIAGNOSIS — E871 Hypo-osmolality and hyponatremia: Secondary | ICD-10-CM | POA: Diagnosis not present

## 2020-01-31 DIAGNOSIS — I5032 Chronic diastolic (congestive) heart failure: Secondary | ICD-10-CM | POA: Diagnosis not present

## 2020-01-31 DIAGNOSIS — E211 Secondary hyperparathyroidism, not elsewhere classified: Secondary | ICD-10-CM | POA: Diagnosis not present

## 2020-01-31 DIAGNOSIS — E1122 Type 2 diabetes mellitus with diabetic chronic kidney disease: Secondary | ICD-10-CM | POA: Diagnosis not present

## 2020-01-31 DIAGNOSIS — N189 Chronic kidney disease, unspecified: Secondary | ICD-10-CM | POA: Diagnosis not present

## 2020-02-05 ENCOUNTER — Ambulatory Visit (INDEPENDENT_AMBULATORY_CARE_PROVIDER_SITE_OTHER): Payer: Medicare Other | Admitting: *Deleted

## 2020-02-05 ENCOUNTER — Other Ambulatory Visit: Payer: Self-pay | Admitting: Cardiovascular Disease

## 2020-02-05 ENCOUNTER — Other Ambulatory Visit: Payer: Self-pay

## 2020-02-05 DIAGNOSIS — Z952 Presence of prosthetic heart valve: Secondary | ICD-10-CM

## 2020-02-05 DIAGNOSIS — Z5181 Encounter for therapeutic drug level monitoring: Secondary | ICD-10-CM | POA: Diagnosis not present

## 2020-02-05 LAB — POCT INR: INR: 3 (ref 2.0–3.0)

## 2020-02-05 NOTE — Patient Instructions (Signed)
Continue warfarin 1 tablet daily except 1/2 tablet on Mondays and Thursdays Recheck in 4 wks

## 2020-02-21 DIAGNOSIS — R809 Proteinuria, unspecified: Secondary | ICD-10-CM | POA: Diagnosis not present

## 2020-02-21 DIAGNOSIS — M109 Gout, unspecified: Secondary | ICD-10-CM | POA: Diagnosis not present

## 2020-02-21 DIAGNOSIS — I5032 Chronic diastolic (congestive) heart failure: Secondary | ICD-10-CM | POA: Diagnosis not present

## 2020-02-21 DIAGNOSIS — E1122 Type 2 diabetes mellitus with diabetic chronic kidney disease: Secondary | ICD-10-CM | POA: Diagnosis not present

## 2020-02-21 DIAGNOSIS — E211 Secondary hyperparathyroidism, not elsewhere classified: Secondary | ICD-10-CM | POA: Diagnosis not present

## 2020-02-21 DIAGNOSIS — E1129 Type 2 diabetes mellitus with other diabetic kidney complication: Secondary | ICD-10-CM | POA: Diagnosis not present

## 2020-02-21 DIAGNOSIS — E871 Hypo-osmolality and hyponatremia: Secondary | ICD-10-CM | POA: Diagnosis not present

## 2020-02-21 DIAGNOSIS — N189 Chronic kidney disease, unspecified: Secondary | ICD-10-CM | POA: Diagnosis not present

## 2020-02-21 DIAGNOSIS — E559 Vitamin D deficiency, unspecified: Secondary | ICD-10-CM | POA: Diagnosis not present

## 2020-02-21 DIAGNOSIS — I129 Hypertensive chronic kidney disease with stage 1 through stage 4 chronic kidney disease, or unspecified chronic kidney disease: Secondary | ICD-10-CM | POA: Diagnosis not present

## 2020-02-25 ENCOUNTER — Other Ambulatory Visit: Payer: Self-pay | Admitting: Cardiovascular Disease

## 2020-03-03 MED ORDER — METOPROLOL TARTRATE 50 MG PO TABS: ORAL_TABLET | ORAL | 0 refills | Status: DC

## 2020-03-04 ENCOUNTER — Ambulatory Visit (INDEPENDENT_AMBULATORY_CARE_PROVIDER_SITE_OTHER): Payer: Medicare Other | Admitting: *Deleted

## 2020-03-04 DIAGNOSIS — Z952 Presence of prosthetic heart valve: Secondary | ICD-10-CM | POA: Diagnosis not present

## 2020-03-04 DIAGNOSIS — Z5181 Encounter for therapeutic drug level monitoring: Secondary | ICD-10-CM

## 2020-03-04 LAB — POCT INR: INR: 3.4 — AB (ref 2.0–3.0)

## 2020-03-04 NOTE — Patient Instructions (Signed)
Take 1/2 tablet tonight then decrease dose to 1 tablet daily except 1/2 tablet on Mondays, Wednesdays and Fridays Recheck in 4 wks

## 2020-03-05 DIAGNOSIS — Z1389 Encounter for screening for other disorder: Secondary | ICD-10-CM | POA: Diagnosis not present

## 2020-03-05 DIAGNOSIS — E039 Hypothyroidism, unspecified: Secondary | ICD-10-CM | POA: Diagnosis not present

## 2020-03-05 DIAGNOSIS — Z6841 Body Mass Index (BMI) 40.0 and over, adult: Secondary | ICD-10-CM | POA: Diagnosis not present

## 2020-03-05 DIAGNOSIS — Z0001 Encounter for general adult medical examination with abnormal findings: Secondary | ICD-10-CM | POA: Diagnosis not present

## 2020-03-05 DIAGNOSIS — W57XXXA Bitten or stung by nonvenomous insect and other nonvenomous arthropods, initial encounter: Secondary | ICD-10-CM | POA: Diagnosis not present

## 2020-03-05 DIAGNOSIS — E1165 Type 2 diabetes mellitus with hyperglycemia: Secondary | ICD-10-CM | POA: Diagnosis not present

## 2020-03-19 DIAGNOSIS — E559 Vitamin D deficiency, unspecified: Secondary | ICD-10-CM | POA: Diagnosis not present

## 2020-03-19 DIAGNOSIS — E1122 Type 2 diabetes mellitus with diabetic chronic kidney disease: Secondary | ICD-10-CM | POA: Diagnosis not present

## 2020-03-19 DIAGNOSIS — I129 Hypertensive chronic kidney disease with stage 1 through stage 4 chronic kidney disease, or unspecified chronic kidney disease: Secondary | ICD-10-CM | POA: Diagnosis not present

## 2020-03-19 DIAGNOSIS — N1831 Chronic kidney disease, stage 3a: Secondary | ICD-10-CM | POA: Diagnosis not present

## 2020-04-01 ENCOUNTER — Ambulatory Visit (INDEPENDENT_AMBULATORY_CARE_PROVIDER_SITE_OTHER): Payer: Medicare Other | Admitting: *Deleted

## 2020-04-01 DIAGNOSIS — Z952 Presence of prosthetic heart valve: Secondary | ICD-10-CM | POA: Diagnosis not present

## 2020-04-01 DIAGNOSIS — Z5181 Encounter for therapeutic drug level monitoring: Secondary | ICD-10-CM

## 2020-04-01 LAB — POCT INR: INR: 2.6 (ref 2.0–3.0)

## 2020-04-01 NOTE — Patient Instructions (Signed)
Continue warfarin 1 tablet daily except 1/2 tablet on Mondays, Wednesdays and Fridays Recheck in 4 wks 

## 2020-04-03 ENCOUNTER — Other Ambulatory Visit: Payer: Self-pay | Admitting: Cardiovascular Disease

## 2020-04-18 DIAGNOSIS — I129 Hypertensive chronic kidney disease with stage 1 through stage 4 chronic kidney disease, or unspecified chronic kidney disease: Secondary | ICD-10-CM | POA: Diagnosis not present

## 2020-04-18 DIAGNOSIS — E559 Vitamin D deficiency, unspecified: Secondary | ICD-10-CM | POA: Diagnosis not present

## 2020-04-18 DIAGNOSIS — E1122 Type 2 diabetes mellitus with diabetic chronic kidney disease: Secondary | ICD-10-CM | POA: Diagnosis not present

## 2020-04-18 DIAGNOSIS — N1831 Chronic kidney disease, stage 3a: Secondary | ICD-10-CM | POA: Diagnosis not present

## 2020-04-29 ENCOUNTER — Ambulatory Visit (INDEPENDENT_AMBULATORY_CARE_PROVIDER_SITE_OTHER): Payer: Medicare Other | Admitting: *Deleted

## 2020-04-29 DIAGNOSIS — Z952 Presence of prosthetic heart valve: Secondary | ICD-10-CM | POA: Diagnosis not present

## 2020-04-29 DIAGNOSIS — Z5181 Encounter for therapeutic drug level monitoring: Secondary | ICD-10-CM

## 2020-04-29 LAB — POCT INR: INR: 2.7 (ref 2.0–3.0)

## 2020-04-29 NOTE — Patient Instructions (Addendum)
Continue warfarin 1 tablet daily except 1/2 tablet on Mondays, Wednesdays and Fridays Recheck in 6 wks 

## 2020-05-20 DIAGNOSIS — I129 Hypertensive chronic kidney disease with stage 1 through stage 4 chronic kidney disease, or unspecified chronic kidney disease: Secondary | ICD-10-CM | POA: Diagnosis not present

## 2020-05-20 DIAGNOSIS — E1122 Type 2 diabetes mellitus with diabetic chronic kidney disease: Secondary | ICD-10-CM | POA: Diagnosis not present

## 2020-05-20 DIAGNOSIS — E7849 Other hyperlipidemia: Secondary | ICD-10-CM | POA: Diagnosis not present

## 2020-05-20 DIAGNOSIS — N1831 Chronic kidney disease, stage 3a: Secondary | ICD-10-CM | POA: Diagnosis not present

## 2020-06-03 ENCOUNTER — Ambulatory Visit (INDEPENDENT_AMBULATORY_CARE_PROVIDER_SITE_OTHER): Payer: Medicare Other | Admitting: *Deleted

## 2020-06-03 ENCOUNTER — Other Ambulatory Visit: Payer: Self-pay

## 2020-06-03 ENCOUNTER — Telehealth: Payer: Self-pay | Admitting: Cardiology

## 2020-06-03 DIAGNOSIS — Z5181 Encounter for therapeutic drug level monitoring: Secondary | ICD-10-CM | POA: Diagnosis not present

## 2020-06-03 DIAGNOSIS — Z952 Presence of prosthetic heart valve: Secondary | ICD-10-CM

## 2020-06-03 LAB — POCT INR: INR: 2.1 (ref 2.0–3.0)

## 2020-06-03 NOTE — Patient Instructions (Signed)
Continue warfarin 1 tablet daily except 1/2 tablet on Mondays, Wednesdays and Fridays Recheck in 6 wks 

## 2020-06-18 DIAGNOSIS — E559 Vitamin D deficiency, unspecified: Secondary | ICD-10-CM | POA: Diagnosis not present

## 2020-06-18 DIAGNOSIS — R809 Proteinuria, unspecified: Secondary | ICD-10-CM | POA: Diagnosis not present

## 2020-06-18 DIAGNOSIS — Z23 Encounter for immunization: Secondary | ICD-10-CM | POA: Diagnosis not present

## 2020-06-18 DIAGNOSIS — E1129 Type 2 diabetes mellitus with other diabetic kidney complication: Secondary | ICD-10-CM | POA: Diagnosis not present

## 2020-06-18 DIAGNOSIS — E211 Secondary hyperparathyroidism, not elsewhere classified: Secondary | ICD-10-CM | POA: Diagnosis not present

## 2020-06-18 DIAGNOSIS — I129 Hypertensive chronic kidney disease with stage 1 through stage 4 chronic kidney disease, or unspecified chronic kidney disease: Secondary | ICD-10-CM | POA: Diagnosis not present

## 2020-06-18 DIAGNOSIS — N189 Chronic kidney disease, unspecified: Secondary | ICD-10-CM | POA: Diagnosis not present

## 2020-06-18 DIAGNOSIS — I5032 Chronic diastolic (congestive) heart failure: Secondary | ICD-10-CM | POA: Diagnosis not present

## 2020-06-18 DIAGNOSIS — E1122 Type 2 diabetes mellitus with diabetic chronic kidney disease: Secondary | ICD-10-CM | POA: Diagnosis not present

## 2020-06-18 DIAGNOSIS — E871 Hypo-osmolality and hyponatremia: Secondary | ICD-10-CM | POA: Diagnosis not present

## 2020-06-19 DIAGNOSIS — I350 Nonrheumatic aortic (valve) stenosis: Secondary | ICD-10-CM | POA: Diagnosis not present

## 2020-06-19 DIAGNOSIS — G4700 Insomnia, unspecified: Secondary | ICD-10-CM | POA: Diagnosis not present

## 2020-06-19 DIAGNOSIS — M25569 Pain in unspecified knee: Secondary | ICD-10-CM | POA: Diagnosis not present

## 2020-06-19 DIAGNOSIS — G894 Chronic pain syndrome: Secondary | ICD-10-CM | POA: Diagnosis not present

## 2020-06-19 DIAGNOSIS — I129 Hypertensive chronic kidney disease with stage 1 through stage 4 chronic kidney disease, or unspecified chronic kidney disease: Secondary | ICD-10-CM | POA: Diagnosis not present

## 2020-06-19 DIAGNOSIS — G4710 Hypersomnia, unspecified: Secondary | ICD-10-CM | POA: Diagnosis not present

## 2020-06-19 DIAGNOSIS — M461 Sacroiliitis, not elsewhere classified: Secondary | ICD-10-CM | POA: Diagnosis not present

## 2020-06-19 DIAGNOSIS — E669 Obesity, unspecified: Secondary | ICD-10-CM | POA: Diagnosis not present

## 2020-06-19 DIAGNOSIS — E1122 Type 2 diabetes mellitus with diabetic chronic kidney disease: Secondary | ICD-10-CM | POA: Diagnosis not present

## 2020-06-19 DIAGNOSIS — M545 Low back pain: Secondary | ICD-10-CM | POA: Diagnosis not present

## 2020-06-19 DIAGNOSIS — Z79891 Long term (current) use of opiate analgesic: Secondary | ICD-10-CM | POA: Diagnosis not present

## 2020-06-19 DIAGNOSIS — N1831 Chronic kidney disease, stage 3a: Secondary | ICD-10-CM | POA: Diagnosis not present

## 2020-06-20 ENCOUNTER — Other Ambulatory Visit: Payer: Self-pay | Admitting: Cardiovascular Disease

## 2020-06-24 ENCOUNTER — Telehealth: Payer: Self-pay | Admitting: Cardiovascular Disease

## 2020-06-24 NOTE — Telephone Encounter (Signed)
lvm for patient to return call to get follow up scheduled with [facility] from recall list

## 2020-06-26 DIAGNOSIS — E559 Vitamin D deficiency, unspecified: Secondary | ICD-10-CM | POA: Diagnosis not present

## 2020-06-26 DIAGNOSIS — E211 Secondary hyperparathyroidism, not elsewhere classified: Secondary | ICD-10-CM | POA: Diagnosis not present

## 2020-06-26 DIAGNOSIS — N189 Chronic kidney disease, unspecified: Secondary | ICD-10-CM | POA: Diagnosis not present

## 2020-06-26 DIAGNOSIS — E1122 Type 2 diabetes mellitus with diabetic chronic kidney disease: Secondary | ICD-10-CM | POA: Diagnosis not present

## 2020-06-26 DIAGNOSIS — Z6841 Body Mass Index (BMI) 40.0 and over, adult: Secondary | ICD-10-CM | POA: Diagnosis not present

## 2020-06-26 DIAGNOSIS — E872 Acidosis: Secondary | ICD-10-CM | POA: Diagnosis not present

## 2020-07-07 ENCOUNTER — Other Ambulatory Visit: Payer: Self-pay | Admitting: Cardiovascular Disease

## 2020-07-15 ENCOUNTER — Ambulatory Visit (INDEPENDENT_AMBULATORY_CARE_PROVIDER_SITE_OTHER): Payer: Medicare Other | Admitting: *Deleted

## 2020-07-15 DIAGNOSIS — Z952 Presence of prosthetic heart valve: Secondary | ICD-10-CM

## 2020-07-15 DIAGNOSIS — Z5181 Encounter for therapeutic drug level monitoring: Secondary | ICD-10-CM

## 2020-07-15 LAB — POCT INR: INR: 3.3 — AB (ref 2.0–3.0)

## 2020-07-15 NOTE — Patient Instructions (Signed)
Hold warfarin tonight then resume 1 tablet daily except 1/2 tablet on Mondays, Wednesdays and Fridays Recheck in 5 wks

## 2020-08-19 ENCOUNTER — Ambulatory Visit (INDEPENDENT_AMBULATORY_CARE_PROVIDER_SITE_OTHER): Payer: Medicare Other | Admitting: *Deleted

## 2020-08-19 DIAGNOSIS — Z952 Presence of prosthetic heart valve: Secondary | ICD-10-CM

## 2020-08-19 DIAGNOSIS — Z5181 Encounter for therapeutic drug level monitoring: Secondary | ICD-10-CM

## 2020-08-19 DIAGNOSIS — E1122 Type 2 diabetes mellitus with diabetic chronic kidney disease: Secondary | ICD-10-CM | POA: Diagnosis not present

## 2020-08-19 DIAGNOSIS — I350 Nonrheumatic aortic (valve) stenosis: Secondary | ICD-10-CM | POA: Diagnosis not present

## 2020-08-19 DIAGNOSIS — N1831 Chronic kidney disease, stage 3a: Secondary | ICD-10-CM | POA: Diagnosis not present

## 2020-08-19 DIAGNOSIS — I129 Hypertensive chronic kidney disease with stage 1 through stage 4 chronic kidney disease, or unspecified chronic kidney disease: Secondary | ICD-10-CM | POA: Diagnosis not present

## 2020-08-19 LAB — POCT INR: INR: 3.3 — AB (ref 2.0–3.0)

## 2020-08-19 NOTE — Patient Instructions (Signed)
Take warfarin 1/2 tablet tonight then decrease dose to 1/2 tablet daily except 1 tablet on Sundays, Tuesdays and Thursdays Recheck in 5 wks

## 2020-08-23 DIAGNOSIS — Z23 Encounter for immunization: Secondary | ICD-10-CM | POA: Diagnosis not present

## 2020-09-10 DIAGNOSIS — G4700 Insomnia, unspecified: Secondary | ICD-10-CM | POA: Diagnosis not present

## 2020-09-10 DIAGNOSIS — Z79891 Long term (current) use of opiate analgesic: Secondary | ICD-10-CM | POA: Diagnosis not present

## 2020-09-10 DIAGNOSIS — M545 Low back pain, unspecified: Secondary | ICD-10-CM | POA: Diagnosis not present

## 2020-09-10 DIAGNOSIS — E669 Obesity, unspecified: Secondary | ICD-10-CM | POA: Diagnosis not present

## 2020-09-10 DIAGNOSIS — M25569 Pain in unspecified knee: Secondary | ICD-10-CM | POA: Diagnosis not present

## 2020-09-10 DIAGNOSIS — G894 Chronic pain syndrome: Secondary | ICD-10-CM | POA: Diagnosis not present

## 2020-09-10 DIAGNOSIS — G4710 Hypersomnia, unspecified: Secondary | ICD-10-CM | POA: Diagnosis not present

## 2020-09-10 DIAGNOSIS — M461 Sacroiliitis, not elsewhere classified: Secondary | ICD-10-CM | POA: Diagnosis not present

## 2020-09-11 ENCOUNTER — Other Ambulatory Visit: Payer: Self-pay | Admitting: Cardiovascular Disease

## 2020-09-11 DIAGNOSIS — E872 Acidosis: Secondary | ICD-10-CM | POA: Diagnosis not present

## 2020-09-11 DIAGNOSIS — E559 Vitamin D deficiency, unspecified: Secondary | ICD-10-CM | POA: Diagnosis not present

## 2020-09-11 DIAGNOSIS — E211 Secondary hyperparathyroidism, not elsewhere classified: Secondary | ICD-10-CM | POA: Diagnosis not present

## 2020-09-11 DIAGNOSIS — E1122 Type 2 diabetes mellitus with diabetic chronic kidney disease: Secondary | ICD-10-CM | POA: Diagnosis not present

## 2020-09-11 DIAGNOSIS — N189 Chronic kidney disease, unspecified: Secondary | ICD-10-CM | POA: Diagnosis not present

## 2020-09-11 DIAGNOSIS — Z6841 Body Mass Index (BMI) 40.0 and over, adult: Secondary | ICD-10-CM | POA: Diagnosis not present

## 2020-09-19 DIAGNOSIS — I350 Nonrheumatic aortic (valve) stenosis: Secondary | ICD-10-CM | POA: Diagnosis not present

## 2020-09-19 DIAGNOSIS — E1122 Type 2 diabetes mellitus with diabetic chronic kidney disease: Secondary | ICD-10-CM | POA: Diagnosis not present

## 2020-09-19 DIAGNOSIS — I129 Hypertensive chronic kidney disease with stage 1 through stage 4 chronic kidney disease, or unspecified chronic kidney disease: Secondary | ICD-10-CM | POA: Diagnosis not present

## 2020-09-19 DIAGNOSIS — N1831 Chronic kidney disease, stage 3a: Secondary | ICD-10-CM | POA: Diagnosis not present

## 2020-10-01 ENCOUNTER — Ambulatory Visit (INDEPENDENT_AMBULATORY_CARE_PROVIDER_SITE_OTHER): Payer: Medicare Other | Admitting: *Deleted

## 2020-10-01 DIAGNOSIS — Z5181 Encounter for therapeutic drug level monitoring: Secondary | ICD-10-CM

## 2020-10-01 DIAGNOSIS — Z952 Presence of prosthetic heart valve: Secondary | ICD-10-CM | POA: Diagnosis not present

## 2020-10-01 LAB — POCT INR: INR: 2 (ref 2.0–3.0)

## 2020-10-01 NOTE — Patient Instructions (Signed)
Continue warfarin 1/2 tablet daily except 1 tablet on Sundays, Tuesdays and Thursdays ?Recheck in 6 wks ?

## 2020-10-09 DIAGNOSIS — E872 Acidosis: Secondary | ICD-10-CM | POA: Diagnosis not present

## 2020-10-09 DIAGNOSIS — R809 Proteinuria, unspecified: Secondary | ICD-10-CM | POA: Diagnosis not present

## 2020-10-09 DIAGNOSIS — N189 Chronic kidney disease, unspecified: Secondary | ICD-10-CM | POA: Diagnosis not present

## 2020-10-09 DIAGNOSIS — E1129 Type 2 diabetes mellitus with other diabetic kidney complication: Secondary | ICD-10-CM | POA: Diagnosis not present

## 2020-10-09 DIAGNOSIS — E1122 Type 2 diabetes mellitus with diabetic chronic kidney disease: Secondary | ICD-10-CM | POA: Diagnosis not present

## 2020-10-09 DIAGNOSIS — E211 Secondary hyperparathyroidism, not elsewhere classified: Secondary | ICD-10-CM | POA: Diagnosis not present

## 2020-10-09 DIAGNOSIS — Z6841 Body Mass Index (BMI) 40.0 and over, adult: Secondary | ICD-10-CM | POA: Diagnosis not present

## 2020-10-16 ENCOUNTER — Other Ambulatory Visit: Payer: Self-pay

## 2020-10-16 MED ORDER — WARFARIN SODIUM 5 MG PO TABS: ORAL_TABLET | ORAL | 1 refills | Status: DC

## 2020-10-16 NOTE — Telephone Encounter (Signed)
This is a [facility] pt °

## 2020-10-20 DIAGNOSIS — H40013 Open angle with borderline findings, low risk, bilateral: Secondary | ICD-10-CM | POA: Diagnosis not present

## 2020-11-12 ENCOUNTER — Ambulatory Visit (INDEPENDENT_AMBULATORY_CARE_PROVIDER_SITE_OTHER): Payer: Medicare Other | Admitting: *Deleted

## 2020-11-12 DIAGNOSIS — Z5181 Encounter for therapeutic drug level monitoring: Secondary | ICD-10-CM | POA: Diagnosis not present

## 2020-11-12 DIAGNOSIS — Z8679 Personal history of other diseases of the circulatory system: Secondary | ICD-10-CM

## 2020-11-12 DIAGNOSIS — Z952 Presence of prosthetic heart valve: Secondary | ICD-10-CM | POA: Diagnosis not present

## 2020-11-12 DIAGNOSIS — Z9889 Other specified postprocedural states: Secondary | ICD-10-CM | POA: Diagnosis not present

## 2020-11-12 LAB — POCT INR: INR: 1.8 — AB (ref 2.0–3.0)

## 2020-11-12 NOTE — Patient Instructions (Signed)
Increase warfarin to 1 tablet daily except 1/2 tablet on Mondays, Wednesdays and Fridays Recheck in 4 wks 

## 2020-11-17 DIAGNOSIS — E1122 Type 2 diabetes mellitus with diabetic chronic kidney disease: Secondary | ICD-10-CM | POA: Diagnosis not present

## 2020-11-17 DIAGNOSIS — N1831 Chronic kidney disease, stage 3a: Secondary | ICD-10-CM | POA: Diagnosis not present

## 2020-11-17 DIAGNOSIS — I350 Nonrheumatic aortic (valve) stenosis: Secondary | ICD-10-CM | POA: Diagnosis not present

## 2020-11-17 DIAGNOSIS — I129 Hypertensive chronic kidney disease with stage 1 through stage 4 chronic kidney disease, or unspecified chronic kidney disease: Secondary | ICD-10-CM | POA: Diagnosis not present

## 2020-12-10 ENCOUNTER — Telehealth: Payer: Self-pay | Admitting: Cardiovascular Disease

## 2020-12-10 MED ORDER — METOPROLOL TARTRATE 50 MG PO TABS: 25.0000 mg | ORAL_TABLET | Freq: Two times a day (BID) | ORAL | 3 refills | Status: DC

## 2020-12-10 NOTE — Telephone Encounter (Signed)
   *  STAT* If patient is at the pharmacy, call can be transferred to refill team.   1. Which medications need to be refilled? (please list name of each medication and dose if known)   metoprolol tartrate (LOPRESSOR) 50 MG tablet     2. Which pharmacy/location (including street and city if local pharmacy) is medication to be sent to? [facility] [place], [place] AT Portal to Registered [employer] Sites  3. Do they need a 30 day or 90 day supply? 90 days

## 2020-12-11 ENCOUNTER — Other Ambulatory Visit: Payer: Self-pay

## 2020-12-11 ENCOUNTER — Ambulatory Visit (INDEPENDENT_AMBULATORY_CARE_PROVIDER_SITE_OTHER): Payer: Medicare Other | Admitting: Pharmacist

## 2020-12-11 DIAGNOSIS — Z952 Presence of prosthetic heart valve: Secondary | ICD-10-CM | POA: Diagnosis not present

## 2020-12-11 DIAGNOSIS — Z5181 Encounter for therapeutic drug level monitoring: Secondary | ICD-10-CM | POA: Diagnosis not present

## 2020-12-11 LAB — POCT INR: INR: 2.4 (ref 2.0–3.0)

## 2020-12-11 NOTE — Patient Instructions (Signed)
Description   Continue 1 tablet daily except 1/2 tablet on Mondays, Wednesdays and Fridays Recheck in 6 wks

## 2020-12-17 DIAGNOSIS — Z79891 Long term (current) use of opiate analgesic: Secondary | ICD-10-CM | POA: Diagnosis not present

## 2020-12-17 DIAGNOSIS — G4489 Other headache syndrome: Secondary | ICD-10-CM | POA: Diagnosis not present

## 2020-12-17 DIAGNOSIS — E669 Obesity, unspecified: Secondary | ICD-10-CM | POA: Diagnosis not present

## 2020-12-17 DIAGNOSIS — M25569 Pain in unspecified knee: Secondary | ICD-10-CM | POA: Diagnosis not present

## 2020-12-17 DIAGNOSIS — G894 Chronic pain syndrome: Secondary | ICD-10-CM | POA: Diagnosis not present

## 2020-12-17 DIAGNOSIS — M461 Sacroiliitis, not elsewhere classified: Secondary | ICD-10-CM | POA: Diagnosis not present

## 2020-12-17 DIAGNOSIS — G4700 Insomnia, unspecified: Secondary | ICD-10-CM | POA: Diagnosis not present

## 2020-12-17 DIAGNOSIS — G4710 Hypersomnia, unspecified: Secondary | ICD-10-CM | POA: Diagnosis not present

## 2020-12-17 DIAGNOSIS — M545 Low back pain, unspecified: Secondary | ICD-10-CM | POA: Diagnosis not present

## 2020-12-19 ENCOUNTER — Ambulatory Visit: Payer: Medicare Other | Admitting: Cardiovascular Disease

## 2020-12-25 DIAGNOSIS — M25562 Pain in left knee: Secondary | ICD-10-CM | POA: Diagnosis not present

## 2020-12-25 DIAGNOSIS — M25661 Stiffness of right knee, not elsewhere classified: Secondary | ICD-10-CM | POA: Diagnosis not present

## 2020-12-25 DIAGNOSIS — M25662 Stiffness of left knee, not elsewhere classified: Secondary | ICD-10-CM | POA: Diagnosis not present

## 2020-12-25 DIAGNOSIS — R29898 Other symptoms and signs involving the musculoskeletal system: Secondary | ICD-10-CM | POA: Diagnosis not present

## 2020-12-25 DIAGNOSIS — R2689 Other abnormalities of gait and mobility: Secondary | ICD-10-CM | POA: Diagnosis not present

## 2020-12-25 DIAGNOSIS — M25561 Pain in right knee: Secondary | ICD-10-CM | POA: Diagnosis not present

## 2020-12-26 ENCOUNTER — Encounter: Payer: Self-pay | Admitting: Cardiovascular Disease

## 2020-12-26 ENCOUNTER — Ambulatory Visit (INDEPENDENT_AMBULATORY_CARE_PROVIDER_SITE_OTHER): Payer: Medicare Other | Admitting: Cardiovascular Disease

## 2020-12-26 ENCOUNTER — Other Ambulatory Visit: Payer: Self-pay

## 2020-12-26 VITALS — BP 138/68 | HR 64 | Ht 62.0 in | Wt 262.0 lb

## 2020-12-26 DIAGNOSIS — Z952 Presence of prosthetic heart valve: Secondary | ICD-10-CM | POA: Diagnosis not present

## 2020-12-26 DIAGNOSIS — I10 Essential (primary) hypertension: Secondary | ICD-10-CM

## 2020-12-26 DIAGNOSIS — E782 Mixed hyperlipidemia: Secondary | ICD-10-CM

## 2020-12-26 DIAGNOSIS — E1169 Type 2 diabetes mellitus with other specified complication: Secondary | ICD-10-CM | POA: Diagnosis not present

## 2020-12-26 DIAGNOSIS — Z8739 Personal history of other diseases of the musculoskeletal system and connective tissue: Secondary | ICD-10-CM

## 2020-12-26 DIAGNOSIS — E669 Obesity, unspecified: Secondary | ICD-10-CM

## 2020-12-26 NOTE — Progress Notes (Signed)
Cardiology Office Note    Date:  12/26/2020   ID:  [name], DOB December 29, 1950, MRN [id number]  PCP:  Redmond School, MD  Cardiologist:   Sanda Klein, MD   Chief complaint: Follow-up mechanical aortic valve replacement  History of Present Illness:  [name] is a 70 y.o. female here for follow-up of mechanical aortic valve replacement and ascending aortic aneurysm repair Deneen Harts procedure, 2011 , 21 mm valve) performed for bicuspid aortic valve with critical aortic stenosis. Additional problems include hypertension, diabetes mellitus, mixed hyperlipidemia in the setting of morbid obesity.  She still misses he husband who passed about two years ago.  Activity limited primarily by knee pain and back pain.  She is now using a cane for stability, but she has not had any falls.  The patient specifically denies any chest pain at rest exertion, dyspnea at rest or with exertion, orthopnea, paroxysmal nocturnal dyspnea, syncope, palpitations, focal neurological deficits, intermittent claudication, lower extremity edema, unexplained weight gain, cough, hemoptysis or wheezing.  She has not had bleeding problems.  Her INR is followed in [place].  She has difficulty losing weight and has multiple dietary restrictions.  She has alpha gal sensitization cannot eat mammalian meat.  She has a gout and also has a lactose tolerance which limits her intake of dairy foods.  Despite this glucose control is good with a hemoglobin A1c of 6%.  Most recent LDL cholesterol remains higher than desirable at 114 and triglycerides are high at 224 (03/05/2020).  Blood pressure is borderline high today, but recently checked at 120/70.  Her most recent creatinine was much improved at 1.1.  She sees Dr. Lowanda Foster in [place] in the nephrology clinic.   Her last echocardiogram was performed in January 2019 and showed normal gradients for the 21 mm mechanical aortic valve, albeit a little higher than  they had been 5 years earlier.  Prothrombin time monitoring is performed in [place].    Past Medical History:  Diagnosis Date  . AI (aortic insufficiency)   . Arthritis   . AS (aortic stenosis) 09/18/2012   2D Echo EF 55%-60%  . Chronic kidney disease    renal insuffcentily  . Coronary artery disease   . Cough   . Diabetes mellitus ([place])   . Gout    right foot  . Hyperlipidemia   . Hypertension    dr Donetta Potts  . Obese   . PONV (postoperative nausea and vomiting)    with early surgeries , none recent  . Renal insufficiency   . Tricuspid regurgitation     Past Surgical History:  Procedure Laterality Date  . AORTIC VALVE REPLACEMENT  04/01/2010   21-mm Sr Jude Regent ;mechanical valve   . BACK SURGERY    . broken arm     wire in lt elbow  . CARDIAC CATHETERIZATION  02/09/2010  . FRACTURE SURGERY    . GANGLION CYST EXCISION Left 01/08/2016   Procedure: LEFT WRIST EXCISION OF VOLAR CYST, EXCISION OF MUCOID TUMOR;  Surgeon: Daryll Brod, MD;  Location: [facility];  Service: Orthopedics;  Laterality: Left;  . LUMBAR LAMINECTOMY/DECOMPRESSION MICRODISCECTOMY N/A 02/28/2013   Procedure: LUMBAR LAMINECTOMY/DECOMPRESSION MICRODISCECTOMY 2 LEVELS;  Surgeon: Ophelia Charter, MD;  Location: [facility];  Service: Neurosurgery;  Laterality: N/A;  Lumbar three-four and Lumbar four-five laminectomies  . MASS EXCISION Left 01/08/2016   Procedure: EXCISION MUCOID TUMOR LEFT INDEX FINGER DISTAL INTERPHALANGEAL JOINT;  Surgeon: Daryll Brod, MD;  Location: [facility];  Service: Orthopedics;  Laterality: Left;  . replacement  of ascending aortic aneurysm  04/01/2010   28-mm supracoronary Hemashield tube graft using deep hypothermic circulatory arrest    Current Medications: Outpatient Medications Prior to Visit  Medication Sig Dispense Refill  . allopurinol (ZYLOPRIM) 100 MG tablet Take 100 mg by mouth daily.    Marland Kitchen b complex vitamins tablet Take 1 tablet by mouth  daily.    . Biotin 10000 MCG TABS Take 2,000 mg by mouth daily at 3 pm.    . cetirizine (ZYRTEC) 10 MG tablet Take 10 mg by mouth daily.    Marland Kitchen gabapentin (NEURONTIN) 300 MG capsule Take 300 mg by mouth daily.    Marland Kitchen glimepiride (AMARYL) 2 MG tablet Take 2 mg by mouth daily before breakfast.    . HYDROcodone-acetaminophen (NORCO/VICODIN) 5-325 MG tablet Take 1 tablet by mouth every 8 (eight) hours as needed.    Marland Kitchen lisinopril (PRINIVIL,ZESTRIL) 10 MG tablet Take 10 mg by mouth daily.    . metoprolol tartrate (LOPRESSOR) 50 MG tablet Take 0.5 tablets (25 mg total) by mouth 2 (two) times daily. 90 tablet 3  . Multiple Vitamin (MULTIVITAMIN WITH MINERALS) TABS Take 1 tablet by mouth daily.    . multivitamin-lutein (OCUVITE-LUTEIN) CAPS Take 1 capsule by mouth daily.    . Omega-3 Fatty Acids (FISH OIL) 1000 MG CAPS Take 1,000 mg by mouth daily.    . pravastatin (PRAVACHOL) 80 MG tablet Take 80 mg by mouth daily.    Marland Kitchen topiramate (TOPAMAX) 25 MG tablet Take 25 mg by mouth 2 (two) times daily.    Marland Kitchen warfarin (JANTOVEN) 5 MG tablet TAKE 1 TO 1 AND 1/2 TABLETSONCE DAILY AS DIRECTED BY  [facility] 120 tablet 1  . zolpidem (AMBIEN) 10 MG tablet Take 10 mg by mouth at bedtime.     . ALPRAZolam (XANAX) 0.5 MG tablet Take 0.5 mg by mouth at bedtime as needed for sleep.      No facility-administered medications prior to visit.     Allergies:   Morphine and related   Social History   Socioeconomic History  . Marital status: Widowed    Spouse name: Not on file  . Number of children: Not on file  . Years of education: Not on file  . Highest education level: Not on file  Occupational History  . Not on file  Tobacco Use  . Smoking status: Never Smoker  . Smokeless tobacco: Never Used  Substance and Sexual Activity  . Alcohol use: No  . Drug use: No  . Sexual activity: Not on file  Other Topics Concern  . Not on file  Social History Narrative  . Not on file   Social Determinants of Health    Financial Resource Strain: Not on file  Food Insecurity: Not on file  Transportation Needs: Not on file  Physical Activity: Not on file  Stress: Not on file  Social Connections: Not on file     Family History:  The patient's family history includes CAD in an other family member; Cancer - Lung in her maternal grandfather; Cancer - Prostate in her father; Diabetes in her mother and another family member; Hypertension in an other family member.   ROS:   Please see the history of present illness.    All other systems are reviewed and are negative  PHYSICAL EXAM:   VS:  BP 138/68 (BP Location: Left Arm, Patient Position: Sitting, Cuff Size: Large)   Pulse 64  Ht _0  (1.575 m)   Wt 262 lb (118.8 kg)   BMI 47.92 kg/m      General: Alert, oriented x3, no distress, Morbidly obese Head: no evidence of trauma, PERRL, EOMI, no exophtalmos or lid lag, no myxedema, no xanthelasma; normal ears, nose and oropharynx Neck: normal jugular venous pulsations and no hepatojugular reflux; brisk carotid pulses without delay and no carotid bruits Chest: clear to auscultation, no signs of consolidation by percussion or palpation, normal fremitus, symmetrical and full respiratory excursions Cardiovascular: normal position and quality of the apical impulse, regular rhythm, crisp mechanical valve clicks, no murmurs, rubs or gallops Abdomen: no tenderness or distention, no masses by palpation, no abnormal pulsatility or arterial bruits, normal bowel sounds, no hepatosplenomegaly Extremities: no clubbing, cyanosis or edema; 2+ radial, ulnar and brachial pulses bilaterally; 2+ right femoral, posterior tibial and dorsalis pedis pulses; 2+ left femoral, posterior tibial and dorsalis pedis pulses; no subclavian or femoral bruits Neurological: grossly nonfocal Psych: Normal mood and affect    Wt Readings from Last 3 Encounters:  12/26/20 262 lb (118.8 kg)  05/16/19 250 lb 3.2 oz (113.5 kg)  10/23/18 255 lb  9.6 oz (115.9 kg)      Studies/Labs Reviewed:   ECHO 10/13/2017:   - Left ventricle: The cavity size was normal. There was moderate concentric hypertrophy. Systolic function was normal. The   estimated ejection fraction was in the range of 55% to 60%. Wall motion was normal; there were no regional wall motion   abnormalities. There was an increased relative contribution of atrial contraction to ventricular filling. Doppler parameters are   consistent with abnormal left ventricular relaxation (grade 1 diastolic dysfunction). - Aortic valve: There was very mild stenosis. Mean gradient (S): 13 mm Hg. - Aorta: s/p Bentall procedures with normal aortic root and ascending aortic dimensions. - Mitral valve: Calcified annulus. - Left atrium: The atrium was mildly dilated. - Right ventricle: The cavity size was mildly dilated. Wall thickness was normal. - Pulmonary arteries: PA peak pressure: 39 mm Hg (S).  Impressions:  - Moderate LVH, normal LVF, mild TR, MAC, mild TR, mild PHT with PASP estimated at 28mHg, G1DD, mild RVE, S/P Bentall with normal aortic root and ascending aortic dimensions, calcified poorly visualized AV with very mild AS. The right ventricular systolic pressure was increased consistent with mild pulmonary hypertension.  EKG:  EKG is not ordered today.  It is unchanged from previous tracing it shows sinus rhythm and repolarization changes consistent with LVH (voltage partially Mast by obesity).  Recent Labs: June 05, 2018 Hemoglobin 13.5, normal iron studies except high ferritin creatinine 0.91 (improved), BUN 15 , calcium 9.9, potassium 4.5 Normalized intact PTH 49  February 23, 2019  hemoglobin 13.8, ALT 57, TSH 1.03, hemoglobin A1c 6.5%  June 2021 Hemoglobin A1c 6.0%, potassium 5.24 August 2020 creatinine 1.1, hemoglobin 13.7  Lipid Panel February 2017 total cholesterol 269, triglycerides 223, HDL 57, LDL 167  February 23, 2019 total cholesterol 207, HDL 57, LDL 114,  triglycerides 182 June 2021 total cholesterol 207, HDL 57, LDL 109, triglycerides 224  ASSESSMENT:    1. H/O mechanical aortic valve replacement   2. Morbid obesity ([place]   3. Mixed hyperlipidemia   4. Diabetes mellitus type 2 in obese ([place]   5. Essential hypertension   6. Hypercalcemia   7. History of gout      PLAN:  In order of problems listed above:  1. S/P AVR: No clinical evidence prosthetic valve dysfunction.  Plan to repeat an echocardiogram, last performed over 3 years ago.  Compliant with anticoagulation monitoring. 2. Obesity: Gained some weight which is counterproductive for her joint and cardiovascular problems.  Multiple dietary restrictions due to alpha gal sensitization, lactose intolerance, gout.  Suggested increased intake of fermented dairy and plant-based protein sources, limited intake of sugars or high glycemic index carbohydrates. 3. HLP: She did not have coronary atherosclerosis on presurgical cardiac catheterization.  She has not tolerated the more potent statins, but is on pravastatin the maximum dose.  Due for repeat LDL check in June.  Suggested adding Zetia if LDL remains over 100. 4. DM: Well-controlled on most recent hemoglobin A1c, but she has gained some weight since then. 5. HTN: Blood pressure control is good.  Slightly high systolic blood pressure today, but her very recent blood pressure is 120/70.  Creatinine has returned to baseline so she is back on her ACE inhibitor. 6. HyperCa: History of hypercalcemia, hyperparathyroidism, avoid thiazide diuretics. 7. Gout: Clarified sources of purines and the fact that not all high protein foods necessarily increase the risk of gout.  Also avoid thiazide diuretics because of this.    Medication Adjustments/Labs and Tests Ordered: Current medicines are reviewed at length with the patient today.  Concerns regarding medicines are outlined above.  Medication changes, Labs and Tests ordered today are listed in the  Patient Instructions below. Patient Instructions  Medication Instructions:  No changes *If you need a refill on your cardiac medications before your next appointment, please call your pharmacy*   Lab Work: None ordered If you have labs (blood work) drawn today and your tests are completely normal, you will receive your results only by: Marland Kitchen MyChart Message (if you have MyChart) OR . A paper copy in the mail If you have any lab test that is abnormal or we need to change your treatment, we will call you to review the results.   Testing/Procedures: Your physician has requested that you have an echocardiogram. Echocardiography is a painless test that uses sound waves to create images of your heart. It provides your doctor with information about the size and shape of your heart and how well your heart's chambers and valves are working. You may receive an ultrasound enhancing agent through an IV if needed to better visualize your heart during the echo.This procedure takes approximately one hour. There are no restrictions for this procedure. This will take place at [place]: At [facility], you and your health needs are our priority.  As part of our continuing mission to provide you with exceptional heart care, we have created designated Provider Care Teams.  These Care Teams include your primary Cardiologist (physician) and Advanced Practice Providers (APPs -  Physician Assistants and Nurse Practitioners) who all work together to provide you with the care you need, when you need it.  We recommend signing up for the patient portal called "MyChart".  Sign up information is provided on this After Visit Summary.  MyChart is used to connect with patients for Virtual Visits (Telemedicine).  Patients are able to view lab/test results, encounter notes, upcoming appointments, etc.  Non-urgent messages can be sent to your provider as well.   To learn more about what you can do with  MyChart, go to [email or web address].    Your next appointment:   12 month(s)  The format for your next appointment:   In Person  Provider:   You may see Sanda Klein, MD or one  of the following Advanced Practice Providers on your designated Care Team:    Almyra Deforest, PA-C  Fabian Sharp, [place] or   Roby Lofts, PA-C      Signed, Sanda Klein, MD  12/26/2020 8:52 AM    [facility] [address], [place], [place]  [address] Phone: [phone]; Fax: [phone]

## 2020-12-26 NOTE — Patient Instructions (Signed)
Medication Instructions:  No changes *If you need a refill on your cardiac medications before your next appointment, please call your pharmacy*   Lab Work: None ordered If you have labs (blood work) drawn today and your tests are completely normal, you will receive your results only by: Marland Kitchen MyChart Message (if you have MyChart) OR . A paper copy in the mail If you have any lab test that is abnormal or we need to change your treatment, we will call you to review the results.   Testing/Procedures: Your physician has requested that you have an echocardiogram. Echocardiography is a painless test that uses sound waves to create images of your heart. It provides your doctor with information about the size and shape of your heart and how well your heart's chambers and valves are working. You may receive an ultrasound enhancing agent through an IV if needed to better visualize your heart during the echo.This procedure takes approximately one hour. There are no restrictions for this procedure. This will take place at [place]: At [facility], you and your health needs are our priority.  As part of our continuing mission to provide you with exceptional heart care, we have created designated Provider Care Teams.  These Care Teams include your primary Cardiologist (physician) and Advanced Practice Providers (APPs -  Physician Assistants and Nurse Practitioners) who all work together to provide you with the care you need, when you need it.  We recommend signing up for the patient portal called "MyChart".  Sign up information is provided on this After Visit Summary.  MyChart is used to connect with patients for Virtual Visits (Telemedicine).  Patients are able to view lab/test results, encounter notes, upcoming appointments, etc.  Non-urgent messages can be sent to your provider as well.   To learn more about what you can do with MyChart, go to [email or web address].    Your next  appointment:   12 month(s)  The format for your next appointment:   In Person  Provider:   You may see Sanda Klein, MD or one of the following Advanced Practice Providers on your designated Care Team:    Almyra Deforest, PA-C  Fabian Sharp, PA-C or   Roby Lofts, [place]

## 2020-12-30 ENCOUNTER — Telehealth: Payer: Self-pay

## 2020-12-30 NOTE — Telephone Encounter (Signed)
Patient will call back to schedule Echo, she is outside

## 2020-12-30 NOTE — Telephone Encounter (Signed)
-----   Message from [facility] sent at 12/26/2020  2:41 PM EDT ----- Regarding: FW: Echo  ----- Message ----- From: Ricci Barker, RN Sent: 12/26/2020  12:09 PM EDT To: Ricci Barker, RN, [facility] Scheduling Subject: Echo                                           The patient needs her echo scheduled at [place] you

## 2020-12-31 DIAGNOSIS — M25661 Stiffness of right knee, not elsewhere classified: Secondary | ICD-10-CM | POA: Diagnosis not present

## 2020-12-31 DIAGNOSIS — M25561 Pain in right knee: Secondary | ICD-10-CM | POA: Diagnosis not present

## 2020-12-31 DIAGNOSIS — R2689 Other abnormalities of gait and mobility: Secondary | ICD-10-CM | POA: Diagnosis not present

## 2020-12-31 DIAGNOSIS — M25562 Pain in left knee: Secondary | ICD-10-CM | POA: Diagnosis not present

## 2020-12-31 DIAGNOSIS — M25662 Stiffness of left knee, not elsewhere classified: Secondary | ICD-10-CM | POA: Diagnosis not present

## 2020-12-31 DIAGNOSIS — R29898 Other symptoms and signs involving the musculoskeletal system: Secondary | ICD-10-CM | POA: Diagnosis not present

## 2021-01-01 DIAGNOSIS — N189 Chronic kidney disease, unspecified: Secondary | ICD-10-CM | POA: Diagnosis not present

## 2021-01-01 DIAGNOSIS — E211 Secondary hyperparathyroidism, not elsewhere classified: Secondary | ICD-10-CM | POA: Diagnosis not present

## 2021-01-01 DIAGNOSIS — E1122 Type 2 diabetes mellitus with diabetic chronic kidney disease: Secondary | ICD-10-CM | POA: Diagnosis not present

## 2021-01-01 DIAGNOSIS — E872 Acidosis: Secondary | ICD-10-CM | POA: Diagnosis not present

## 2021-01-01 DIAGNOSIS — E1129 Type 2 diabetes mellitus with other diabetic kidney complication: Secondary | ICD-10-CM | POA: Diagnosis not present

## 2021-01-01 DIAGNOSIS — Z6841 Body Mass Index (BMI) 40.0 and over, adult: Secondary | ICD-10-CM | POA: Diagnosis not present

## 2021-01-01 DIAGNOSIS — R809 Proteinuria, unspecified: Secondary | ICD-10-CM | POA: Diagnosis not present

## 2021-01-05 DIAGNOSIS — M25562 Pain in left knee: Secondary | ICD-10-CM | POA: Diagnosis not present

## 2021-01-05 DIAGNOSIS — M25561 Pain in right knee: Secondary | ICD-10-CM | POA: Diagnosis not present

## 2021-01-05 DIAGNOSIS — R2689 Other abnormalities of gait and mobility: Secondary | ICD-10-CM | POA: Diagnosis not present

## 2021-01-05 DIAGNOSIS — M25662 Stiffness of left knee, not elsewhere classified: Secondary | ICD-10-CM | POA: Diagnosis not present

## 2021-01-05 DIAGNOSIS — M25661 Stiffness of right knee, not elsewhere classified: Secondary | ICD-10-CM | POA: Diagnosis not present

## 2021-01-05 DIAGNOSIS — R29898 Other symptoms and signs involving the musculoskeletal system: Secondary | ICD-10-CM | POA: Diagnosis not present

## 2021-01-07 DIAGNOSIS — M25561 Pain in right knee: Secondary | ICD-10-CM | POA: Diagnosis not present

## 2021-01-07 DIAGNOSIS — M25662 Stiffness of left knee, not elsewhere classified: Secondary | ICD-10-CM | POA: Diagnosis not present

## 2021-01-07 DIAGNOSIS — M25661 Stiffness of right knee, not elsewhere classified: Secondary | ICD-10-CM | POA: Diagnosis not present

## 2021-01-07 DIAGNOSIS — M25562 Pain in left knee: Secondary | ICD-10-CM | POA: Diagnosis not present

## 2021-01-07 DIAGNOSIS — R29898 Other symptoms and signs involving the musculoskeletal system: Secondary | ICD-10-CM | POA: Diagnosis not present

## 2021-01-07 DIAGNOSIS — R2689 Other abnormalities of gait and mobility: Secondary | ICD-10-CM | POA: Diagnosis not present

## 2021-01-08 DIAGNOSIS — E1122 Type 2 diabetes mellitus with diabetic chronic kidney disease: Secondary | ICD-10-CM | POA: Diagnosis not present

## 2021-01-08 DIAGNOSIS — E872 Acidosis: Secondary | ICD-10-CM | POA: Diagnosis not present

## 2021-01-08 DIAGNOSIS — E211 Secondary hyperparathyroidism, not elsewhere classified: Secondary | ICD-10-CM | POA: Diagnosis not present

## 2021-01-08 DIAGNOSIS — R809 Proteinuria, unspecified: Secondary | ICD-10-CM | POA: Diagnosis not present

## 2021-01-08 DIAGNOSIS — E1129 Type 2 diabetes mellitus with other diabetic kidney complication: Secondary | ICD-10-CM | POA: Diagnosis not present

## 2021-01-08 DIAGNOSIS — N189 Chronic kidney disease, unspecified: Secondary | ICD-10-CM | POA: Diagnosis not present

## 2021-01-16 ENCOUNTER — Ambulatory Visit (HOSPITAL_COMMUNITY)
Admission: RE | Admit: 2021-01-16 | Discharge: 2021-01-16 | Disposition: A | Discharge status: Home / Self Care | Payer: Medicare Other | Source: Ambulatory Visit | Attending: Cardiovascular Disease | Admitting: Cardiovascular Disease

## 2021-01-16 ENCOUNTER — Other Ambulatory Visit: Payer: Self-pay

## 2021-01-16 DIAGNOSIS — Z952 Presence of prosthetic heart valve: Secondary | ICD-10-CM | POA: Insufficient documentation

## 2021-01-16 LAB — ECHOCARDIOGRAM COMPLETE
AR max vel: 1.24 cm2
AV Area VTI: 1.23 cm2
AV Area mean vel: 1.11 cm2
AV Mean grad: 13 mmHg
AV Peak grad: 20.4 mmHg
Ao pk vel: 2.26 m/s
Area-P 1/2: 3.74 cm2
S' Lateral: 3.16 cm

## 2021-01-16 NOTE — Progress Notes (Signed)
  Echocardiogram 2D Echocardiogram has been performed.  Matilde Bash 01/16/2021, 9:45 AM

## 2021-01-21 DIAGNOSIS — M25662 Stiffness of left knee, not elsewhere classified: Secondary | ICD-10-CM | POA: Diagnosis not present

## 2021-01-21 DIAGNOSIS — R29898 Other symptoms and signs involving the musculoskeletal system: Secondary | ICD-10-CM | POA: Diagnosis not present

## 2021-01-21 DIAGNOSIS — M25562 Pain in left knee: Secondary | ICD-10-CM | POA: Diagnosis not present

## 2021-01-21 DIAGNOSIS — R2689 Other abnormalities of gait and mobility: Secondary | ICD-10-CM | POA: Diagnosis not present

## 2021-01-21 DIAGNOSIS — M25561 Pain in right knee: Secondary | ICD-10-CM | POA: Diagnosis not present

## 2021-01-21 DIAGNOSIS — M25661 Stiffness of right knee, not elsewhere classified: Secondary | ICD-10-CM | POA: Diagnosis not present

## 2021-01-22 ENCOUNTER — Ambulatory Visit (INDEPENDENT_AMBULATORY_CARE_PROVIDER_SITE_OTHER): Payer: Medicare Other | Admitting: *Deleted

## 2021-01-22 DIAGNOSIS — Z5181 Encounter for therapeutic drug level monitoring: Secondary | ICD-10-CM

## 2021-01-22 DIAGNOSIS — Z952 Presence of prosthetic heart valve: Secondary | ICD-10-CM

## 2021-01-22 LAB — POCT INR: INR: 1.9 — AB (ref 2.0–3.0)

## 2021-01-22 NOTE — Patient Instructions (Signed)
Take warfarin 1 1/2 tablets today then resume 1 tablet daily except 1/2 tablet on Mondays, Wednesdays and Fridays Recheck in 6 wks

## 2021-01-26 DIAGNOSIS — M25561 Pain in right knee: Secondary | ICD-10-CM | POA: Diagnosis not present

## 2021-01-26 DIAGNOSIS — M25562 Pain in left knee: Secondary | ICD-10-CM | POA: Diagnosis not present

## 2021-01-26 DIAGNOSIS — M25661 Stiffness of right knee, not elsewhere classified: Secondary | ICD-10-CM | POA: Diagnosis not present

## 2021-01-26 DIAGNOSIS — M545 Low back pain, unspecified: Secondary | ICD-10-CM | POA: Diagnosis not present

## 2021-01-26 DIAGNOSIS — M25662 Stiffness of left knee, not elsewhere classified: Secondary | ICD-10-CM | POA: Diagnosis not present

## 2021-01-26 DIAGNOSIS — R2689 Other abnormalities of gait and mobility: Secondary | ICD-10-CM | POA: Diagnosis not present

## 2021-01-28 DIAGNOSIS — M545 Low back pain, unspecified: Secondary | ICD-10-CM | POA: Diagnosis not present

## 2021-01-28 DIAGNOSIS — M25661 Stiffness of right knee, not elsewhere classified: Secondary | ICD-10-CM | POA: Diagnosis not present

## 2021-01-28 DIAGNOSIS — M25662 Stiffness of left knee, not elsewhere classified: Secondary | ICD-10-CM | POA: Diagnosis not present

## 2021-01-28 DIAGNOSIS — M25561 Pain in right knee: Secondary | ICD-10-CM | POA: Diagnosis not present

## 2021-01-28 DIAGNOSIS — M25562 Pain in left knee: Secondary | ICD-10-CM | POA: Diagnosis not present

## 2021-01-28 DIAGNOSIS — R2689 Other abnormalities of gait and mobility: Secondary | ICD-10-CM | POA: Diagnosis not present

## 2021-02-06 DIAGNOSIS — M25661 Stiffness of right knee, not elsewhere classified: Secondary | ICD-10-CM | POA: Diagnosis not present

## 2021-02-06 DIAGNOSIS — M25561 Pain in right knee: Secondary | ICD-10-CM | POA: Diagnosis not present

## 2021-02-06 DIAGNOSIS — M545 Low back pain, unspecified: Secondary | ICD-10-CM | POA: Diagnosis not present

## 2021-02-06 DIAGNOSIS — M25562 Pain in left knee: Secondary | ICD-10-CM | POA: Diagnosis not present

## 2021-02-06 DIAGNOSIS — M25662 Stiffness of left knee, not elsewhere classified: Secondary | ICD-10-CM | POA: Diagnosis not present

## 2021-02-06 DIAGNOSIS — R2689 Other abnormalities of gait and mobility: Secondary | ICD-10-CM | POA: Diagnosis not present

## 2021-02-10 DIAGNOSIS — M545 Low back pain, unspecified: Secondary | ICD-10-CM | POA: Diagnosis not present

## 2021-02-10 DIAGNOSIS — M25561 Pain in right knee: Secondary | ICD-10-CM | POA: Diagnosis not present

## 2021-02-10 DIAGNOSIS — M25562 Pain in left knee: Secondary | ICD-10-CM | POA: Diagnosis not present

## 2021-02-10 DIAGNOSIS — M25662 Stiffness of left knee, not elsewhere classified: Secondary | ICD-10-CM | POA: Diagnosis not present

## 2021-02-10 DIAGNOSIS — R2689 Other abnormalities of gait and mobility: Secondary | ICD-10-CM | POA: Diagnosis not present

## 2021-02-10 DIAGNOSIS — M25661 Stiffness of right knee, not elsewhere classified: Secondary | ICD-10-CM | POA: Diagnosis not present

## 2021-02-12 DIAGNOSIS — M25561 Pain in right knee: Secondary | ICD-10-CM | POA: Diagnosis not present

## 2021-02-12 DIAGNOSIS — M25562 Pain in left knee: Secondary | ICD-10-CM | POA: Diagnosis not present

## 2021-02-12 DIAGNOSIS — M545 Low back pain, unspecified: Secondary | ICD-10-CM | POA: Diagnosis not present

## 2021-02-12 DIAGNOSIS — M25662 Stiffness of left knee, not elsewhere classified: Secondary | ICD-10-CM | POA: Diagnosis not present

## 2021-02-12 DIAGNOSIS — R2689 Other abnormalities of gait and mobility: Secondary | ICD-10-CM | POA: Diagnosis not present

## 2021-02-12 DIAGNOSIS — M25661 Stiffness of right knee, not elsewhere classified: Secondary | ICD-10-CM | POA: Diagnosis not present

## 2021-02-17 DIAGNOSIS — I350 Nonrheumatic aortic (valve) stenosis: Secondary | ICD-10-CM | POA: Diagnosis not present

## 2021-02-17 DIAGNOSIS — I129 Hypertensive chronic kidney disease with stage 1 through stage 4 chronic kidney disease, or unspecified chronic kidney disease: Secondary | ICD-10-CM | POA: Diagnosis not present

## 2021-02-17 DIAGNOSIS — N1831 Chronic kidney disease, stage 3a: Secondary | ICD-10-CM | POA: Diagnosis not present

## 2021-02-17 DIAGNOSIS — E1122 Type 2 diabetes mellitus with diabetic chronic kidney disease: Secondary | ICD-10-CM | POA: Diagnosis not present

## 2021-02-26 DIAGNOSIS — M25661 Stiffness of right knee, not elsewhere classified: Secondary | ICD-10-CM | POA: Diagnosis not present

## 2021-02-26 DIAGNOSIS — M545 Low back pain, unspecified: Secondary | ICD-10-CM | POA: Diagnosis not present

## 2021-02-26 DIAGNOSIS — M25561 Pain in right knee: Secondary | ICD-10-CM | POA: Diagnosis not present

## 2021-02-26 DIAGNOSIS — M6281 Muscle weakness (generalized): Secondary | ICD-10-CM | POA: Diagnosis not present

## 2021-02-26 DIAGNOSIS — M25562 Pain in left knee: Secondary | ICD-10-CM | POA: Diagnosis not present

## 2021-02-26 DIAGNOSIS — R2689 Other abnormalities of gait and mobility: Secondary | ICD-10-CM | POA: Diagnosis not present

## 2021-02-26 DIAGNOSIS — M25662 Stiffness of left knee, not elsewhere classified: Secondary | ICD-10-CM | POA: Diagnosis not present

## 2021-03-02 DIAGNOSIS — M545 Low back pain, unspecified: Secondary | ICD-10-CM | POA: Diagnosis not present

## 2021-03-02 DIAGNOSIS — M25662 Stiffness of left knee, not elsewhere classified: Secondary | ICD-10-CM | POA: Diagnosis not present

## 2021-03-02 DIAGNOSIS — R2689 Other abnormalities of gait and mobility: Secondary | ICD-10-CM | POA: Diagnosis not present

## 2021-03-02 DIAGNOSIS — M25661 Stiffness of right knee, not elsewhere classified: Secondary | ICD-10-CM | POA: Diagnosis not present

## 2021-03-02 DIAGNOSIS — M25561 Pain in right knee: Secondary | ICD-10-CM | POA: Diagnosis not present

## 2021-03-02 DIAGNOSIS — M25562 Pain in left knee: Secondary | ICD-10-CM | POA: Diagnosis not present

## 2021-03-05 ENCOUNTER — Ambulatory Visit (INDEPENDENT_AMBULATORY_CARE_PROVIDER_SITE_OTHER): Payer: Medicare Other | Admitting: *Deleted

## 2021-03-05 DIAGNOSIS — Z5181 Encounter for therapeutic drug level monitoring: Secondary | ICD-10-CM | POA: Diagnosis not present

## 2021-03-05 DIAGNOSIS — Z952 Presence of prosthetic heart valve: Secondary | ICD-10-CM | POA: Diagnosis not present

## 2021-03-05 LAB — POCT INR: INR: 2.7 (ref 2.0–3.0)

## 2021-03-05 NOTE — Patient Instructions (Signed)
Continue warfarin 1 tablet daily except 1/2 tablet on Mondays, Wednesdays and Fridays Recheck in 6 wks 

## 2021-03-11 DIAGNOSIS — G894 Chronic pain syndrome: Secondary | ICD-10-CM | POA: Diagnosis not present

## 2021-03-11 DIAGNOSIS — G4710 Hypersomnia, unspecified: Secondary | ICD-10-CM | POA: Diagnosis not present

## 2021-03-11 DIAGNOSIS — M545 Low back pain, unspecified: Secondary | ICD-10-CM | POA: Diagnosis not present

## 2021-03-11 DIAGNOSIS — Z79891 Long term (current) use of opiate analgesic: Secondary | ICD-10-CM | POA: Diagnosis not present

## 2021-03-11 DIAGNOSIS — G4489 Other headache syndrome: Secondary | ICD-10-CM | POA: Diagnosis not present

## 2021-03-11 DIAGNOSIS — M25569 Pain in unspecified knee: Secondary | ICD-10-CM | POA: Diagnosis not present

## 2021-03-11 DIAGNOSIS — G4700 Insomnia, unspecified: Secondary | ICD-10-CM | POA: Diagnosis not present

## 2021-03-11 DIAGNOSIS — E669 Obesity, unspecified: Secondary | ICD-10-CM | POA: Diagnosis not present

## 2021-03-11 DIAGNOSIS — M461 Sacroiliitis, not elsewhere classified: Secondary | ICD-10-CM | POA: Diagnosis not present

## 2021-03-13 DIAGNOSIS — M25561 Pain in right knee: Secondary | ICD-10-CM | POA: Diagnosis not present

## 2021-03-13 DIAGNOSIS — M25662 Stiffness of left knee, not elsewhere classified: Secondary | ICD-10-CM | POA: Diagnosis not present

## 2021-03-13 DIAGNOSIS — M25661 Stiffness of right knee, not elsewhere classified: Secondary | ICD-10-CM | POA: Diagnosis not present

## 2021-03-13 DIAGNOSIS — R2689 Other abnormalities of gait and mobility: Secondary | ICD-10-CM | POA: Diagnosis not present

## 2021-03-13 DIAGNOSIS — M545 Low back pain, unspecified: Secondary | ICD-10-CM | POA: Diagnosis not present

## 2021-03-13 DIAGNOSIS — M25562 Pain in left knee: Secondary | ICD-10-CM | POA: Diagnosis not present

## 2021-03-19 DIAGNOSIS — N1831 Chronic kidney disease, stage 3a: Secondary | ICD-10-CM | POA: Diagnosis not present

## 2021-03-19 DIAGNOSIS — I129 Hypertensive chronic kidney disease with stage 1 through stage 4 chronic kidney disease, or unspecified chronic kidney disease: Secondary | ICD-10-CM | POA: Diagnosis not present

## 2021-03-19 DIAGNOSIS — E1122 Type 2 diabetes mellitus with diabetic chronic kidney disease: Secondary | ICD-10-CM | POA: Diagnosis not present

## 2021-03-19 DIAGNOSIS — I350 Nonrheumatic aortic (valve) stenosis: Secondary | ICD-10-CM | POA: Diagnosis not present

## 2021-03-26 ENCOUNTER — Other Ambulatory Visit (HOSPITAL_COMMUNITY): Payer: Self-pay | Admitting: Internal Medicine

## 2021-03-26 DIAGNOSIS — Z1231 Encounter for screening mammogram for malignant neoplasm of breast: Secondary | ICD-10-CM

## 2021-03-27 DIAGNOSIS — E1129 Type 2 diabetes mellitus with other diabetic kidney complication: Secondary | ICD-10-CM | POA: Diagnosis not present

## 2021-03-27 DIAGNOSIS — R809 Proteinuria, unspecified: Secondary | ICD-10-CM | POA: Diagnosis not present

## 2021-03-27 DIAGNOSIS — E1122 Type 2 diabetes mellitus with diabetic chronic kidney disease: Secondary | ICD-10-CM | POA: Diagnosis not present

## 2021-03-27 DIAGNOSIS — E872 Acidosis: Secondary | ICD-10-CM | POA: Diagnosis not present

## 2021-03-27 DIAGNOSIS — N189 Chronic kidney disease, unspecified: Secondary | ICD-10-CM | POA: Diagnosis not present

## 2021-03-27 DIAGNOSIS — E211 Secondary hyperparathyroidism, not elsewhere classified: Secondary | ICD-10-CM | POA: Diagnosis not present

## 2021-04-01 DIAGNOSIS — E1129 Type 2 diabetes mellitus with other diabetic kidney complication: Secondary | ICD-10-CM | POA: Diagnosis not present

## 2021-04-01 DIAGNOSIS — N189 Chronic kidney disease, unspecified: Secondary | ICD-10-CM | POA: Diagnosis not present

## 2021-04-01 DIAGNOSIS — R809 Proteinuria, unspecified: Secondary | ICD-10-CM | POA: Diagnosis not present

## 2021-04-01 DIAGNOSIS — E1122 Type 2 diabetes mellitus with diabetic chronic kidney disease: Secondary | ICD-10-CM | POA: Diagnosis not present

## 2021-04-01 DIAGNOSIS — E559 Vitamin D deficiency, unspecified: Secondary | ICD-10-CM | POA: Diagnosis not present

## 2021-04-01 DIAGNOSIS — N170 Acute kidney failure with tubular necrosis: Secondary | ICD-10-CM | POA: Diagnosis not present

## 2021-04-14 DIAGNOSIS — E1122 Type 2 diabetes mellitus with diabetic chronic kidney disease: Secondary | ICD-10-CM | POA: Diagnosis not present

## 2021-04-14 DIAGNOSIS — N170 Acute kidney failure with tubular necrosis: Secondary | ICD-10-CM | POA: Diagnosis not present

## 2021-04-14 DIAGNOSIS — N189 Chronic kidney disease, unspecified: Secondary | ICD-10-CM | POA: Diagnosis not present

## 2021-04-14 DIAGNOSIS — E559 Vitamin D deficiency, unspecified: Secondary | ICD-10-CM | POA: Diagnosis not present

## 2021-04-14 DIAGNOSIS — E1165 Type 2 diabetes mellitus with hyperglycemia: Secondary | ICD-10-CM | POA: Diagnosis not present

## 2021-04-14 DIAGNOSIS — Z0001 Encounter for general adult medical examination with abnormal findings: Secondary | ICD-10-CM | POA: Diagnosis not present

## 2021-04-14 DIAGNOSIS — E1129 Type 2 diabetes mellitus with other diabetic kidney complication: Secondary | ICD-10-CM | POA: Diagnosis not present

## 2021-04-14 DIAGNOSIS — E669 Obesity, unspecified: Secondary | ICD-10-CM | POA: Diagnosis not present

## 2021-04-14 DIAGNOSIS — Z6841 Body Mass Index (BMI) 40.0 and over, adult: Secondary | ICD-10-CM | POA: Diagnosis not present

## 2021-04-14 DIAGNOSIS — Z1331 Encounter for screening for depression: Secondary | ICD-10-CM | POA: Diagnosis not present

## 2021-04-14 DIAGNOSIS — R809 Proteinuria, unspecified: Secondary | ICD-10-CM | POA: Diagnosis not present

## 2021-04-14 DIAGNOSIS — Z1389 Encounter for screening for other disorder: Secondary | ICD-10-CM | POA: Diagnosis not present

## 2021-04-16 ENCOUNTER — Ambulatory Visit (INDEPENDENT_AMBULATORY_CARE_PROVIDER_SITE_OTHER): Payer: Medicare Other | Admitting: *Deleted

## 2021-04-16 ENCOUNTER — Other Ambulatory Visit: Payer: Self-pay

## 2021-04-16 DIAGNOSIS — Z952 Presence of prosthetic heart valve: Secondary | ICD-10-CM | POA: Diagnosis not present

## 2021-04-16 DIAGNOSIS — Z5181 Encounter for therapeutic drug level monitoring: Secondary | ICD-10-CM | POA: Diagnosis not present

## 2021-04-16 LAB — POCT INR: INR: 2.2 (ref 2.0–3.0)

## 2021-04-16 NOTE — Patient Instructions (Signed)
Continue warfarin 1 tablet daily except 1/2 tablet on Mondays, Wednesdays and Fridays Recheck in 6 wks 

## 2021-04-19 DIAGNOSIS — I350 Nonrheumatic aortic (valve) stenosis: Secondary | ICD-10-CM | POA: Diagnosis not present

## 2021-04-19 DIAGNOSIS — N1831 Chronic kidney disease, stage 3a: Secondary | ICD-10-CM | POA: Diagnosis not present

## 2021-04-19 DIAGNOSIS — I129 Hypertensive chronic kidney disease with stage 1 through stage 4 chronic kidney disease, or unspecified chronic kidney disease: Secondary | ICD-10-CM | POA: Diagnosis not present

## 2021-04-19 DIAGNOSIS — E1122 Type 2 diabetes mellitus with diabetic chronic kidney disease: Secondary | ICD-10-CM | POA: Diagnosis not present

## 2021-04-20 ENCOUNTER — Ambulatory Visit (HOSPITAL_COMMUNITY)
Admission: RE | Admit: 2021-04-20 | Discharge: 2021-04-20 | Disposition: A | Discharge status: Home / Self Care | Payer: Medicare Other | Source: Ambulatory Visit | Attending: Internal Medicine | Admitting: Internal Medicine

## 2021-04-20 ENCOUNTER — Other Ambulatory Visit: Payer: Self-pay

## 2021-04-20 DIAGNOSIS — Z1231 Encounter for screening mammogram for malignant neoplasm of breast: Secondary | ICD-10-CM | POA: Diagnosis not present

## 2021-05-06 DIAGNOSIS — E669 Obesity, unspecified: Secondary | ICD-10-CM | POA: Diagnosis not present

## 2021-05-06 DIAGNOSIS — M461 Sacroiliitis, not elsewhere classified: Secondary | ICD-10-CM | POA: Diagnosis not present

## 2021-05-06 DIAGNOSIS — G4700 Insomnia, unspecified: Secondary | ICD-10-CM | POA: Diagnosis not present

## 2021-05-06 DIAGNOSIS — M25569 Pain in unspecified knee: Secondary | ICD-10-CM | POA: Diagnosis not present

## 2021-05-06 DIAGNOSIS — M545 Low back pain, unspecified: Secondary | ICD-10-CM | POA: Diagnosis not present

## 2021-05-06 DIAGNOSIS — G894 Chronic pain syndrome: Secondary | ICD-10-CM | POA: Diagnosis not present

## 2021-05-06 DIAGNOSIS — G4710 Hypersomnia, unspecified: Secondary | ICD-10-CM | POA: Diagnosis not present

## 2021-05-06 DIAGNOSIS — Z79891 Long term (current) use of opiate analgesic: Secondary | ICD-10-CM | POA: Diagnosis not present

## 2021-05-06 DIAGNOSIS — G4489 Other headache syndrome: Secondary | ICD-10-CM | POA: Diagnosis not present

## 2021-05-06 DIAGNOSIS — I10 Essential (primary) hypertension: Secondary | ICD-10-CM | POA: Diagnosis not present

## 2021-05-14 DIAGNOSIS — E1122 Type 2 diabetes mellitus with diabetic chronic kidney disease: Secondary | ICD-10-CM | POA: Diagnosis not present

## 2021-05-14 DIAGNOSIS — R809 Proteinuria, unspecified: Secondary | ICD-10-CM | POA: Diagnosis not present

## 2021-05-14 DIAGNOSIS — N189 Chronic kidney disease, unspecified: Secondary | ICD-10-CM | POA: Diagnosis not present

## 2021-05-14 DIAGNOSIS — N170 Acute kidney failure with tubular necrosis: Secondary | ICD-10-CM | POA: Diagnosis not present

## 2021-05-14 DIAGNOSIS — E1129 Type 2 diabetes mellitus with other diabetic kidney complication: Secondary | ICD-10-CM | POA: Diagnosis not present

## 2021-05-14 DIAGNOSIS — E559 Vitamin D deficiency, unspecified: Secondary | ICD-10-CM | POA: Diagnosis not present

## 2021-05-24 LAB — COLOGUARD: Cologuard: NEGATIVE

## 2021-05-29 ENCOUNTER — Ambulatory Visit (INDEPENDENT_AMBULATORY_CARE_PROVIDER_SITE_OTHER): Payer: Medicare Other | Admitting: *Deleted

## 2021-05-29 DIAGNOSIS — Z5181 Encounter for therapeutic drug level monitoring: Secondary | ICD-10-CM

## 2021-05-29 DIAGNOSIS — Z952 Presence of prosthetic heart valve: Secondary | ICD-10-CM | POA: Diagnosis not present

## 2021-05-29 LAB — POCT INR: INR: 3.8 — AB (ref 2.0–3.0)

## 2021-05-29 NOTE — Patient Instructions (Signed)
Hold warfarin tonight then resume 1 tablet daily except 1/2 tablet on Mondays, Wednesdays and Fridays Eat spinach today Recheck in 4 wks

## 2021-06-08 DIAGNOSIS — H40013 Open angle with borderline findings, low risk, bilateral: Secondary | ICD-10-CM | POA: Diagnosis not present

## 2021-06-08 DIAGNOSIS — H524 Presbyopia: Secondary | ICD-10-CM | POA: Diagnosis not present

## 2021-06-24 ENCOUNTER — Ambulatory Visit (INDEPENDENT_AMBULATORY_CARE_PROVIDER_SITE_OTHER): Payer: Medicare Other | Admitting: *Deleted

## 2021-06-24 DIAGNOSIS — Z952 Presence of prosthetic heart valve: Secondary | ICD-10-CM | POA: Diagnosis not present

## 2021-06-24 DIAGNOSIS — Z5181 Encounter for therapeutic drug level monitoring: Secondary | ICD-10-CM | POA: Diagnosis not present

## 2021-06-24 LAB — POCT INR: INR: 2.9 (ref 2.0–3.0)

## 2021-06-24 NOTE — Patient Instructions (Signed)
Continue warfarin 1 tablet daily except 1/2 tablet on Mondays, Wednesdays and Fridays Continue greens Recheck in 4 wks 

## 2021-07-09 DIAGNOSIS — N170 Acute kidney failure with tubular necrosis: Secondary | ICD-10-CM | POA: Diagnosis not present

## 2021-07-09 DIAGNOSIS — E1129 Type 2 diabetes mellitus with other diabetic kidney complication: Secondary | ICD-10-CM | POA: Diagnosis not present

## 2021-07-09 DIAGNOSIS — Z23 Encounter for immunization: Secondary | ICD-10-CM | POA: Diagnosis not present

## 2021-07-09 DIAGNOSIS — E559 Vitamin D deficiency, unspecified: Secondary | ICD-10-CM | POA: Diagnosis not present

## 2021-07-09 DIAGNOSIS — N189 Chronic kidney disease, unspecified: Secondary | ICD-10-CM | POA: Diagnosis not present

## 2021-07-09 DIAGNOSIS — R809 Proteinuria, unspecified: Secondary | ICD-10-CM | POA: Diagnosis not present

## 2021-07-09 DIAGNOSIS — E1122 Type 2 diabetes mellitus with diabetic chronic kidney disease: Secondary | ICD-10-CM | POA: Diagnosis not present

## 2021-07-10 DIAGNOSIS — Z1211 Encounter for screening for malignant neoplasm of colon: Secondary | ICD-10-CM | POA: Diagnosis not present

## 2021-07-16 DIAGNOSIS — N189 Chronic kidney disease, unspecified: Secondary | ICD-10-CM | POA: Diagnosis not present

## 2021-07-16 DIAGNOSIS — R809 Proteinuria, unspecified: Secondary | ICD-10-CM | POA: Diagnosis not present

## 2021-07-16 DIAGNOSIS — E559 Vitamin D deficiency, unspecified: Secondary | ICD-10-CM | POA: Diagnosis not present

## 2021-07-16 DIAGNOSIS — Z6841 Body Mass Index (BMI) 40.0 and over, adult: Secondary | ICD-10-CM | POA: Diagnosis not present

## 2021-07-16 DIAGNOSIS — E1122 Type 2 diabetes mellitus with diabetic chronic kidney disease: Secondary | ICD-10-CM | POA: Diagnosis not present

## 2021-07-16 DIAGNOSIS — E1129 Type 2 diabetes mellitus with other diabetic kidney complication: Secondary | ICD-10-CM | POA: Diagnosis not present

## 2021-07-20 DIAGNOSIS — E1122 Type 2 diabetes mellitus with diabetic chronic kidney disease: Secondary | ICD-10-CM | POA: Diagnosis not present

## 2021-07-20 DIAGNOSIS — N1831 Chronic kidney disease, stage 3a: Secondary | ICD-10-CM | POA: Diagnosis not present

## 2021-07-20 DIAGNOSIS — I129 Hypertensive chronic kidney disease with stage 1 through stage 4 chronic kidney disease, or unspecified chronic kidney disease: Secondary | ICD-10-CM | POA: Diagnosis not present

## 2021-07-20 DIAGNOSIS — I350 Nonrheumatic aortic (valve) stenosis: Secondary | ICD-10-CM | POA: Diagnosis not present

## 2021-07-22 ENCOUNTER — Ambulatory Visit (INDEPENDENT_AMBULATORY_CARE_PROVIDER_SITE_OTHER): Payer: Medicare Other | Admitting: *Deleted

## 2021-07-22 DIAGNOSIS — Z952 Presence of prosthetic heart valve: Secondary | ICD-10-CM | POA: Diagnosis not present

## 2021-07-22 DIAGNOSIS — Z5181 Encounter for therapeutic drug level monitoring: Secondary | ICD-10-CM

## 2021-07-22 LAB — POCT INR: INR: 2 (ref 2.0–3.0)

## 2021-07-22 NOTE — Patient Instructions (Signed)
Continue warfarin 1 tablet daily except 1/2 tablet on Mondays, Wednesdays and Fridays Continue greens Recheck in 4 wks 

## 2021-07-28 DIAGNOSIS — M25569 Pain in unspecified knee: Secondary | ICD-10-CM | POA: Diagnosis not present

## 2021-07-28 DIAGNOSIS — M545 Low back pain, unspecified: Secondary | ICD-10-CM | POA: Diagnosis not present

## 2021-07-28 DIAGNOSIS — G4700 Insomnia, unspecified: Secondary | ICD-10-CM | POA: Diagnosis not present

## 2021-07-28 DIAGNOSIS — Z79891 Long term (current) use of opiate analgesic: Secondary | ICD-10-CM | POA: Diagnosis not present

## 2021-07-28 DIAGNOSIS — G4710 Hypersomnia, unspecified: Secondary | ICD-10-CM | POA: Diagnosis not present

## 2021-07-28 DIAGNOSIS — G4489 Other headache syndrome: Secondary | ICD-10-CM | POA: Diagnosis not present

## 2021-07-28 DIAGNOSIS — I10 Essential (primary) hypertension: Secondary | ICD-10-CM | POA: Diagnosis not present

## 2021-07-28 DIAGNOSIS — E669 Obesity, unspecified: Secondary | ICD-10-CM | POA: Diagnosis not present

## 2021-07-28 DIAGNOSIS — M461 Sacroiliitis, not elsewhere classified: Secondary | ICD-10-CM | POA: Diagnosis not present

## 2021-07-28 DIAGNOSIS — G894 Chronic pain syndrome: Secondary | ICD-10-CM | POA: Diagnosis not present

## 2021-07-30 DIAGNOSIS — Z20822 Contact with and (suspected) exposure to covid-19: Secondary | ICD-10-CM | POA: Diagnosis not present

## 2021-08-19 ENCOUNTER — Ambulatory Visit (INDEPENDENT_AMBULATORY_CARE_PROVIDER_SITE_OTHER): Payer: Medicare Other | Admitting: *Deleted

## 2021-08-19 DIAGNOSIS — Z5181 Encounter for therapeutic drug level monitoring: Secondary | ICD-10-CM | POA: Diagnosis not present

## 2021-08-19 DIAGNOSIS — Z952 Presence of prosthetic heart valve: Secondary | ICD-10-CM | POA: Diagnosis not present

## 2021-08-19 LAB — POCT INR: INR: 2.1 (ref 2.0–3.0)

## 2021-08-19 NOTE — Patient Instructions (Signed)
Continue warfarin 1 tablet daily except 1/2 tablet on Mondays, Wednesdays and Fridays Continue greens Recheck in 6 wks

## 2021-09-30 ENCOUNTER — Other Ambulatory Visit: Payer: Self-pay

## 2021-09-30 ENCOUNTER — Ambulatory Visit (INDEPENDENT_AMBULATORY_CARE_PROVIDER_SITE_OTHER): Payer: Medicare Other | Admitting: *Deleted

## 2021-09-30 DIAGNOSIS — Z5181 Encounter for therapeutic drug level monitoring: Secondary | ICD-10-CM

## 2021-09-30 DIAGNOSIS — Z952 Presence of prosthetic heart valve: Secondary | ICD-10-CM | POA: Diagnosis not present

## 2021-09-30 LAB — POCT INR: INR: 1.9 — AB (ref 2.0–3.0)

## 2021-09-30 NOTE — Patient Instructions (Signed)
Take warfarin 1 tablet tonight then resume 1 tablet daily except 1/2 tablet on Mondays, Wednesdays and Fridays Continue greens Recheck in 6 wks

## 2021-10-06 DIAGNOSIS — R809 Proteinuria, unspecified: Secondary | ICD-10-CM | POA: Diagnosis not present

## 2021-10-06 DIAGNOSIS — E1129 Type 2 diabetes mellitus with other diabetic kidney complication: Secondary | ICD-10-CM | POA: Diagnosis not present

## 2021-10-06 DIAGNOSIS — Z6841 Body Mass Index (BMI) 40.0 and over, adult: Secondary | ICD-10-CM | POA: Diagnosis not present

## 2021-10-06 DIAGNOSIS — N189 Chronic kidney disease, unspecified: Secondary | ICD-10-CM | POA: Diagnosis not present

## 2021-10-06 DIAGNOSIS — E1122 Type 2 diabetes mellitus with diabetic chronic kidney disease: Secondary | ICD-10-CM | POA: Diagnosis not present

## 2021-10-06 DIAGNOSIS — E559 Vitamin D deficiency, unspecified: Secondary | ICD-10-CM | POA: Diagnosis not present

## 2021-10-08 DIAGNOSIS — H40011 Open angle with borderline findings, low risk, right eye: Secondary | ICD-10-CM | POA: Diagnosis not present

## 2021-10-15 DIAGNOSIS — R809 Proteinuria, unspecified: Secondary | ICD-10-CM | POA: Diagnosis not present

## 2021-10-15 DIAGNOSIS — E1129 Type 2 diabetes mellitus with other diabetic kidney complication: Secondary | ICD-10-CM | POA: Diagnosis not present

## 2021-10-15 DIAGNOSIS — Z6841 Body Mass Index (BMI) 40.0 and over, adult: Secondary | ICD-10-CM | POA: Diagnosis not present

## 2021-10-15 DIAGNOSIS — I5032 Chronic diastolic (congestive) heart failure: Secondary | ICD-10-CM | POA: Diagnosis not present

## 2021-10-15 DIAGNOSIS — I129 Hypertensive chronic kidney disease with stage 1 through stage 4 chronic kidney disease, or unspecified chronic kidney disease: Secondary | ICD-10-CM | POA: Diagnosis not present

## 2021-10-15 DIAGNOSIS — N189 Chronic kidney disease, unspecified: Secondary | ICD-10-CM | POA: Diagnosis not present

## 2021-10-15 DIAGNOSIS — E1122 Type 2 diabetes mellitus with diabetic chronic kidney disease: Secondary | ICD-10-CM | POA: Diagnosis not present

## 2021-10-19 DIAGNOSIS — L03032 Cellulitis of left toe: Secondary | ICD-10-CM | POA: Diagnosis not present

## 2021-10-19 DIAGNOSIS — M79672 Pain in left foot: Secondary | ICD-10-CM | POA: Diagnosis not present

## 2021-10-19 DIAGNOSIS — L600 Ingrowing nail: Secondary | ICD-10-CM | POA: Diagnosis not present

## 2021-10-19 DIAGNOSIS — M79675 Pain in left toe(s): Secondary | ICD-10-CM | POA: Diagnosis not present

## 2021-10-20 DIAGNOSIS — I350 Nonrheumatic aortic (valve) stenosis: Secondary | ICD-10-CM | POA: Diagnosis not present

## 2021-10-20 DIAGNOSIS — N1831 Chronic kidney disease, stage 3a: Secondary | ICD-10-CM | POA: Diagnosis not present

## 2021-10-20 DIAGNOSIS — I129 Hypertensive chronic kidney disease with stage 1 through stage 4 chronic kidney disease, or unspecified chronic kidney disease: Secondary | ICD-10-CM | POA: Diagnosis not present

## 2021-10-20 DIAGNOSIS — E1122 Type 2 diabetes mellitus with diabetic chronic kidney disease: Secondary | ICD-10-CM | POA: Diagnosis not present

## 2021-11-02 DIAGNOSIS — M25569 Pain in unspecified knee: Secondary | ICD-10-CM | POA: Diagnosis not present

## 2021-11-02 DIAGNOSIS — M461 Sacroiliitis, not elsewhere classified: Secondary | ICD-10-CM | POA: Diagnosis not present

## 2021-11-02 DIAGNOSIS — G894 Chronic pain syndrome: Secondary | ICD-10-CM | POA: Diagnosis not present

## 2021-11-02 DIAGNOSIS — G4700 Insomnia, unspecified: Secondary | ICD-10-CM | POA: Diagnosis not present

## 2021-11-02 DIAGNOSIS — M545 Low back pain, unspecified: Secondary | ICD-10-CM | POA: Diagnosis not present

## 2021-11-02 DIAGNOSIS — I10 Essential (primary) hypertension: Secondary | ICD-10-CM | POA: Diagnosis not present

## 2021-11-02 DIAGNOSIS — Z79891 Long term (current) use of opiate analgesic: Secondary | ICD-10-CM | POA: Diagnosis not present

## 2021-11-02 DIAGNOSIS — G4710 Hypersomnia, unspecified: Secondary | ICD-10-CM | POA: Diagnosis not present

## 2021-11-02 DIAGNOSIS — G4489 Other headache syndrome: Secondary | ICD-10-CM | POA: Diagnosis not present

## 2021-11-02 DIAGNOSIS — E669 Obesity, unspecified: Secondary | ICD-10-CM | POA: Diagnosis not present

## 2021-11-03 DIAGNOSIS — M79672 Pain in left foot: Secondary | ICD-10-CM | POA: Diagnosis not present

## 2021-11-03 DIAGNOSIS — L03032 Cellulitis of left toe: Secondary | ICD-10-CM | POA: Diagnosis not present

## 2021-11-03 DIAGNOSIS — M79675 Pain in left toe(s): Secondary | ICD-10-CM | POA: Diagnosis not present

## 2021-11-11 ENCOUNTER — Ambulatory Visit (INDEPENDENT_AMBULATORY_CARE_PROVIDER_SITE_OTHER): Payer: Medicare Other | Admitting: *Deleted

## 2021-11-11 DIAGNOSIS — Z952 Presence of prosthetic heart valve: Secondary | ICD-10-CM

## 2021-11-11 DIAGNOSIS — Z5181 Encounter for therapeutic drug level monitoring: Secondary | ICD-10-CM | POA: Diagnosis not present

## 2021-11-11 LAB — POCT INR: INR: 1.9 — AB (ref 2.0–3.0)

## 2021-11-11 NOTE — Patient Instructions (Signed)
Increase warfarin to 1 tablet daily except 1/2 tablet on Mondays and Fridays Continue greens Recheck in 6 wks

## 2021-11-16 DIAGNOSIS — Z20822 Contact with and (suspected) exposure to covid-19: Secondary | ICD-10-CM | POA: Diagnosis not present

## 2021-11-19 DIAGNOSIS — L600 Ingrowing nail: Secondary | ICD-10-CM | POA: Diagnosis not present

## 2021-11-19 DIAGNOSIS — M79672 Pain in left foot: Secondary | ICD-10-CM | POA: Diagnosis not present

## 2021-11-19 DIAGNOSIS — M79675 Pain in left toe(s): Secondary | ICD-10-CM | POA: Diagnosis not present

## 2021-11-22 ENCOUNTER — Other Ambulatory Visit: Payer: Self-pay | Admitting: Cardiovascular Disease

## 2021-12-01 DIAGNOSIS — Z20822 Contact with and (suspected) exposure to covid-19: Secondary | ICD-10-CM | POA: Diagnosis not present

## 2021-12-18 DIAGNOSIS — I129 Hypertensive chronic kidney disease with stage 1 through stage 4 chronic kidney disease, or unspecified chronic kidney disease: Secondary | ICD-10-CM | POA: Diagnosis not present

## 2021-12-18 DIAGNOSIS — N1831 Chronic kidney disease, stage 3a: Secondary | ICD-10-CM | POA: Diagnosis not present

## 2021-12-18 DIAGNOSIS — E1122 Type 2 diabetes mellitus with diabetic chronic kidney disease: Secondary | ICD-10-CM | POA: Diagnosis not present

## 2021-12-23 ENCOUNTER — Ambulatory Visit (INDEPENDENT_AMBULATORY_CARE_PROVIDER_SITE_OTHER): Payer: Medicare Other | Admitting: *Deleted

## 2021-12-23 DIAGNOSIS — Z952 Presence of prosthetic heart valve: Secondary | ICD-10-CM | POA: Diagnosis not present

## 2021-12-23 DIAGNOSIS — Z5181 Encounter for therapeutic drug level monitoring: Secondary | ICD-10-CM

## 2021-12-23 LAB — POCT INR: INR: 2.6 (ref 2.0–3.0)

## 2021-12-23 NOTE — Patient Instructions (Signed)
Continue warfarin 1 tablet daily except 1/2 tablet on Mondays and Fridays Continue greens Recheck in 6 wks 

## 2022-01-04 DIAGNOSIS — Z20822 Contact with and (suspected) exposure to covid-19: Secondary | ICD-10-CM | POA: Diagnosis not present

## 2022-01-05 DIAGNOSIS — Z20822 Contact with and (suspected) exposure to covid-19: Secondary | ICD-10-CM | POA: Diagnosis not present

## 2022-01-08 DIAGNOSIS — R051 Acute cough: Secondary | ICD-10-CM | POA: Diagnosis not present

## 2022-01-08 DIAGNOSIS — E1129 Type 2 diabetes mellitus with other diabetic kidney complication: Secondary | ICD-10-CM | POA: Diagnosis not present

## 2022-01-08 DIAGNOSIS — Z20822 Contact with and (suspected) exposure to covid-19: Secondary | ICD-10-CM | POA: Diagnosis not present

## 2022-01-08 DIAGNOSIS — R059 Cough, unspecified: Secondary | ICD-10-CM | POA: Diagnosis not present

## 2022-01-08 DIAGNOSIS — I5032 Chronic diastolic (congestive) heart failure: Secondary | ICD-10-CM | POA: Diagnosis not present

## 2022-01-08 DIAGNOSIS — I129 Hypertensive chronic kidney disease with stage 1 through stage 4 chronic kidney disease, or unspecified chronic kidney disease: Secondary | ICD-10-CM | POA: Diagnosis not present

## 2022-01-08 DIAGNOSIS — N189 Chronic kidney disease, unspecified: Secondary | ICD-10-CM | POA: Diagnosis not present

## 2022-01-08 DIAGNOSIS — R809 Proteinuria, unspecified: Secondary | ICD-10-CM | POA: Diagnosis not present

## 2022-01-08 DIAGNOSIS — E1122 Type 2 diabetes mellitus with diabetic chronic kidney disease: Secondary | ICD-10-CM | POA: Diagnosis not present

## 2022-01-14 DIAGNOSIS — R809 Proteinuria, unspecified: Secondary | ICD-10-CM | POA: Diagnosis not present

## 2022-01-14 DIAGNOSIS — N189 Chronic kidney disease, unspecified: Secondary | ICD-10-CM | POA: Diagnosis not present

## 2022-01-14 DIAGNOSIS — E1122 Type 2 diabetes mellitus with diabetic chronic kidney disease: Secondary | ICD-10-CM | POA: Diagnosis not present

## 2022-01-14 DIAGNOSIS — I129 Hypertensive chronic kidney disease with stage 1 through stage 4 chronic kidney disease, or unspecified chronic kidney disease: Secondary | ICD-10-CM | POA: Diagnosis not present

## 2022-01-14 DIAGNOSIS — I5032 Chronic diastolic (congestive) heart failure: Secondary | ICD-10-CM | POA: Diagnosis not present

## 2022-01-14 DIAGNOSIS — E1129 Type 2 diabetes mellitus with other diabetic kidney complication: Secondary | ICD-10-CM | POA: Diagnosis not present

## 2022-01-25 DIAGNOSIS — M461 Sacroiliitis, not elsewhere classified: Secondary | ICD-10-CM | POA: Diagnosis not present

## 2022-01-25 DIAGNOSIS — M545 Low back pain, unspecified: Secondary | ICD-10-CM | POA: Diagnosis not present

## 2022-01-25 DIAGNOSIS — Z79891 Long term (current) use of opiate analgesic: Secondary | ICD-10-CM | POA: Diagnosis not present

## 2022-01-25 DIAGNOSIS — M25569 Pain in unspecified knee: Secondary | ICD-10-CM | POA: Diagnosis not present

## 2022-01-25 DIAGNOSIS — G894 Chronic pain syndrome: Secondary | ICD-10-CM | POA: Diagnosis not present

## 2022-01-25 DIAGNOSIS — G4700 Insomnia, unspecified: Secondary | ICD-10-CM | POA: Diagnosis not present

## 2022-01-25 DIAGNOSIS — G4710 Hypersomnia, unspecified: Secondary | ICD-10-CM | POA: Diagnosis not present

## 2022-01-25 DIAGNOSIS — Z20822 Contact with and (suspected) exposure to covid-19: Secondary | ICD-10-CM | POA: Diagnosis not present

## 2022-01-25 DIAGNOSIS — E669 Obesity, unspecified: Secondary | ICD-10-CM | POA: Diagnosis not present

## 2022-02-03 ENCOUNTER — Ambulatory Visit (INDEPENDENT_AMBULATORY_CARE_PROVIDER_SITE_OTHER): Payer: Medicare Other | Admitting: *Deleted

## 2022-02-03 DIAGNOSIS — Z5181 Encounter for therapeutic drug level monitoring: Secondary | ICD-10-CM | POA: Diagnosis not present

## 2022-02-03 DIAGNOSIS — Z952 Presence of prosthetic heart valve: Secondary | ICD-10-CM

## 2022-02-03 LAB — POCT INR: INR: 2.7 (ref 2.0–3.0)

## 2022-02-03 NOTE — Patient Instructions (Signed)
Continue warfarin 1 tablet daily except 1/2 tablet on Mondays and Fridays Continue greens Recheck in 6 wks 

## 2022-02-04 DIAGNOSIS — M79671 Pain in right foot: Secondary | ICD-10-CM | POA: Diagnosis not present

## 2022-02-04 DIAGNOSIS — M722 Plantar fascial fibromatosis: Secondary | ICD-10-CM | POA: Diagnosis not present

## 2022-02-04 DIAGNOSIS — M79672 Pain in left foot: Secondary | ICD-10-CM | POA: Diagnosis not present

## 2022-02-04 DIAGNOSIS — M1990 Unspecified osteoarthritis, unspecified site: Secondary | ICD-10-CM | POA: Diagnosis not present

## 2022-02-25 DIAGNOSIS — Z6841 Body Mass Index (BMI) 40.0 and over, adult: Secondary | ICD-10-CM | POA: Diagnosis not present

## 2022-02-25 DIAGNOSIS — I10 Essential (primary) hypertension: Secondary | ICD-10-CM | POA: Diagnosis not present

## 2022-02-25 DIAGNOSIS — I129 Hypertensive chronic kidney disease with stage 1 through stage 4 chronic kidney disease, or unspecified chronic kidney disease: Secondary | ICD-10-CM | POA: Diagnosis not present

## 2022-02-25 DIAGNOSIS — N1831 Chronic kidney disease, stage 3a: Secondary | ICD-10-CM | POA: Diagnosis not present

## 2022-02-25 DIAGNOSIS — E1165 Type 2 diabetes mellitus with hyperglycemia: Secondary | ICD-10-CM | POA: Diagnosis not present

## 2022-02-25 DIAGNOSIS — M179 Osteoarthritis of knee, unspecified: Secondary | ICD-10-CM | POA: Diagnosis not present

## 2022-02-25 DIAGNOSIS — E559 Vitamin D deficiency, unspecified: Secondary | ICD-10-CM | POA: Diagnosis not present

## 2022-02-25 DIAGNOSIS — E210 Primary hyperparathyroidism: Secondary | ICD-10-CM | POA: Diagnosis not present

## 2022-03-17 ENCOUNTER — Ambulatory Visit (INDEPENDENT_AMBULATORY_CARE_PROVIDER_SITE_OTHER): Payer: Medicare Other | Admitting: *Deleted

## 2022-03-17 DIAGNOSIS — Z5181 Encounter for therapeutic drug level monitoring: Secondary | ICD-10-CM | POA: Diagnosis not present

## 2022-03-17 DIAGNOSIS — Z952 Presence of prosthetic heart valve: Secondary | ICD-10-CM | POA: Diagnosis not present

## 2022-03-17 LAB — POCT INR: INR: 3.4 — AB (ref 2.0–3.0)

## 2022-03-17 NOTE — Patient Instructions (Signed)
Hold warfarin tonight then resume 1 tablet daily except 1/2 tablet on Mondays and Fridays Continue greens Recheck in 4 wks 

## 2022-04-07 DIAGNOSIS — H43822 Vitreomacular adhesion, left eye: Secondary | ICD-10-CM | POA: Diagnosis not present

## 2022-04-07 DIAGNOSIS — E119 Type 2 diabetes mellitus without complications: Secondary | ICD-10-CM | POA: Diagnosis not present

## 2022-04-07 DIAGNOSIS — H43811 Vitreous degeneration, right eye: Secondary | ICD-10-CM | POA: Diagnosis not present

## 2022-04-07 DIAGNOSIS — H353132 Nonexudative age-related macular degeneration, bilateral, intermediate dry stage: Secondary | ICD-10-CM | POA: Diagnosis not present

## 2022-04-19 DIAGNOSIS — G894 Chronic pain syndrome: Secondary | ICD-10-CM | POA: Diagnosis not present

## 2022-04-19 DIAGNOSIS — M25569 Pain in unspecified knee: Secondary | ICD-10-CM | POA: Diagnosis not present

## 2022-04-19 DIAGNOSIS — G4700 Insomnia, unspecified: Secondary | ICD-10-CM | POA: Diagnosis not present

## 2022-04-19 DIAGNOSIS — M461 Sacroiliitis, not elsewhere classified: Secondary | ICD-10-CM | POA: Diagnosis not present

## 2022-04-19 DIAGNOSIS — M545 Low back pain, unspecified: Secondary | ICD-10-CM | POA: Diagnosis not present

## 2022-04-19 DIAGNOSIS — E669 Obesity, unspecified: Secondary | ICD-10-CM | POA: Diagnosis not present

## 2022-04-19 DIAGNOSIS — Z79891 Long term (current) use of opiate analgesic: Secondary | ICD-10-CM | POA: Diagnosis not present

## 2022-04-19 DIAGNOSIS — G4710 Hypersomnia, unspecified: Secondary | ICD-10-CM | POA: Diagnosis not present

## 2022-04-21 ENCOUNTER — Ambulatory Visit (INDEPENDENT_AMBULATORY_CARE_PROVIDER_SITE_OTHER): Payer: Medicare Other | Admitting: *Deleted

## 2022-04-21 DIAGNOSIS — Z952 Presence of prosthetic heart valve: Secondary | ICD-10-CM | POA: Diagnosis not present

## 2022-04-21 DIAGNOSIS — Z5181 Encounter for therapeutic drug level monitoring: Secondary | ICD-10-CM

## 2022-04-21 LAB — POCT INR: INR: 1.8 — AB (ref 2.0–3.0)

## 2022-04-21 NOTE — Patient Instructions (Signed)
Take warfarin 1 1/2 tablets tonight then resume 1 tablet daily except 1/2 tablet on Mondays and Fridays Continue greens Recheck in 4 wks

## 2022-05-19 ENCOUNTER — Ambulatory Visit: Payer: Medicare Other | Attending: Cardiovascular Disease | Admitting: *Deleted

## 2022-05-19 DIAGNOSIS — Z952 Presence of prosthetic heart valve: Secondary | ICD-10-CM | POA: Insufficient documentation

## 2022-05-19 DIAGNOSIS — Z5181 Encounter for therapeutic drug level monitoring: Secondary | ICD-10-CM | POA: Diagnosis not present

## 2022-05-19 LAB — POCT INR: INR: 2.3 (ref 2.0–3.0)

## 2022-05-19 NOTE — Patient Instructions (Signed)
Continue warfarin 1 tablet daily except 1/2 tablet on Mondays and Fridays Continue greens Recheck in 5 wks

## 2022-05-27 DIAGNOSIS — I129 Hypertensive chronic kidney disease with stage 1 through stage 4 chronic kidney disease, or unspecified chronic kidney disease: Secondary | ICD-10-CM | POA: Diagnosis not present

## 2022-05-27 DIAGNOSIS — E039 Hypothyroidism, unspecified: Secondary | ICD-10-CM | POA: Diagnosis not present

## 2022-05-27 DIAGNOSIS — E119 Type 2 diabetes mellitus without complications: Secondary | ICD-10-CM | POA: Diagnosis not present

## 2022-05-27 DIAGNOSIS — J329 Chronic sinusitis, unspecified: Secondary | ICD-10-CM | POA: Diagnosis not present

## 2022-05-27 DIAGNOSIS — Z23 Encounter for immunization: Secondary | ICD-10-CM | POA: Diagnosis not present

## 2022-05-27 DIAGNOSIS — Z952 Presence of prosthetic heart valve: Secondary | ICD-10-CM | POA: Diagnosis not present

## 2022-05-27 DIAGNOSIS — Z0001 Encounter for general adult medical examination with abnormal findings: Secondary | ICD-10-CM | POA: Diagnosis not present

## 2022-05-27 DIAGNOSIS — D518 Other vitamin B12 deficiency anemias: Secondary | ICD-10-CM | POA: Diagnosis not present

## 2022-05-27 DIAGNOSIS — Z1331 Encounter for screening for depression: Secondary | ICD-10-CM | POA: Diagnosis not present

## 2022-05-27 DIAGNOSIS — N1831 Chronic kidney disease, stage 3a: Secondary | ICD-10-CM | POA: Diagnosis not present

## 2022-05-27 DIAGNOSIS — Z6841 Body Mass Index (BMI) 40.0 and over, adult: Secondary | ICD-10-CM | POA: Diagnosis not present

## 2022-05-27 DIAGNOSIS — E1165 Type 2 diabetes mellitus with hyperglycemia: Secondary | ICD-10-CM | POA: Diagnosis not present

## 2022-05-27 DIAGNOSIS — E1129 Type 2 diabetes mellitus with other diabetic kidney complication: Secondary | ICD-10-CM | POA: Diagnosis not present

## 2022-05-27 DIAGNOSIS — E559 Vitamin D deficiency, unspecified: Secondary | ICD-10-CM | POA: Diagnosis not present

## 2022-05-27 DIAGNOSIS — E210 Primary hyperparathyroidism: Secondary | ICD-10-CM | POA: Diagnosis not present

## 2022-06-23 ENCOUNTER — Ambulatory Visit: Payer: Medicare Other | Attending: Cardiovascular Disease | Admitting: *Deleted

## 2022-06-23 DIAGNOSIS — Z952 Presence of prosthetic heart valve: Secondary | ICD-10-CM | POA: Diagnosis not present

## 2022-06-23 DIAGNOSIS — Z5181 Encounter for therapeutic drug level monitoring: Secondary | ICD-10-CM | POA: Diagnosis not present

## 2022-06-23 LAB — POCT INR: INR: 2.1 (ref 2.0–3.0)

## 2022-06-23 NOTE — Patient Instructions (Signed)
Continue warfarin 1 tablet daily except 1/2 tablet on Mondays and Fridays Continue greens Recheck in 6 wks

## 2022-07-08 DIAGNOSIS — M1991 Primary osteoarthritis, unspecified site: Secondary | ICD-10-CM | POA: Diagnosis not present

## 2022-07-08 DIAGNOSIS — I10 Essential (primary) hypertension: Secondary | ICD-10-CM | POA: Diagnosis not present

## 2022-07-08 DIAGNOSIS — E1122 Type 2 diabetes mellitus with diabetic chronic kidney disease: Secondary | ICD-10-CM | POA: Diagnosis not present

## 2022-07-08 DIAGNOSIS — I129 Hypertensive chronic kidney disease with stage 1 through stage 4 chronic kidney disease, or unspecified chronic kidney disease: Secondary | ICD-10-CM | POA: Diagnosis not present

## 2022-07-12 DIAGNOSIS — M25569 Pain in unspecified knee: Secondary | ICD-10-CM | POA: Diagnosis not present

## 2022-07-12 DIAGNOSIS — M545 Low back pain, unspecified: Secondary | ICD-10-CM | POA: Diagnosis not present

## 2022-07-12 DIAGNOSIS — G894 Chronic pain syndrome: Secondary | ICD-10-CM | POA: Diagnosis not present

## 2022-07-12 DIAGNOSIS — E669 Obesity, unspecified: Secondary | ICD-10-CM | POA: Diagnosis not present

## 2022-07-12 DIAGNOSIS — G4700 Insomnia, unspecified: Secondary | ICD-10-CM | POA: Diagnosis not present

## 2022-07-12 DIAGNOSIS — Z79891 Long term (current) use of opiate analgesic: Secondary | ICD-10-CM | POA: Diagnosis not present

## 2022-07-12 DIAGNOSIS — M461 Sacroiliitis, not elsewhere classified: Secondary | ICD-10-CM | POA: Diagnosis not present

## 2022-08-03 ENCOUNTER — Ambulatory Visit: Payer: Medicare Other | Attending: Cardiovascular Disease | Admitting: Cardiovascular Disease

## 2022-08-03 ENCOUNTER — Encounter: Payer: Self-pay | Admitting: Cardiovascular Disease

## 2022-08-03 VITALS — BP 128/64 | HR 58 | Ht 62.0 in | Wt 251.4 lb

## 2022-08-03 DIAGNOSIS — Z952 Presence of prosthetic heart valve: Secondary | ICD-10-CM

## 2022-08-03 DIAGNOSIS — E669 Obesity, unspecified: Secondary | ICD-10-CM | POA: Diagnosis not present

## 2022-08-03 DIAGNOSIS — E1169 Type 2 diabetes mellitus with other specified complication: Secondary | ICD-10-CM | POA: Diagnosis not present

## 2022-08-03 DIAGNOSIS — Z8679 Personal history of other diseases of the circulatory system: Secondary | ICD-10-CM

## 2022-08-03 DIAGNOSIS — I10 Essential (primary) hypertension: Secondary | ICD-10-CM

## 2022-08-03 DIAGNOSIS — E782 Mixed hyperlipidemia: Secondary | ICD-10-CM

## 2022-08-03 DIAGNOSIS — Z8739 Personal history of other diseases of the musculoskeletal system and connective tissue: Secondary | ICD-10-CM | POA: Diagnosis not present

## 2022-08-03 DIAGNOSIS — Z9889 Other specified postprocedural states: Secondary | ICD-10-CM | POA: Diagnosis not present

## 2022-08-03 DIAGNOSIS — I350 Nonrheumatic aortic (valve) stenosis: Secondary | ICD-10-CM

## 2022-08-03 MED ORDER — ROSUVASTATIN CALCIUM 10 MG PO TABS: 10.0000 mg | ORAL_TABLET | Freq: Every day | ORAL | 3 refills | Status: DC

## 2022-08-03 MED ORDER — LISINOPRIL 5 MG PO TABS: 5.0000 mg | ORAL_TABLET | Freq: Every day | ORAL | 3 refills | Status: AC

## 2022-08-03 NOTE — Patient Instructions (Addendum)
Medication Instructions:  Stop Pravastatin  Start Rosuvastatin 10 mg daily  Wean off Metoprolol 12.5 mg twice a day for 3 days then stop  Increase Lisinopril to 5 mg daily   *If you need a refill on your cardiac medications before your next appointment, please call your pharmacy*   Lab Work: Lipid panel in 3 months   Lab order enclosed   Testing/Procedures: Schedule Echo in 07/2023   Follow-Up: At [facility], you and your health needs are our priority.  As part of our continuing mission to provide you with exceptional heart care, we have created designated Provider Care Teams.  These Care Teams include your primary Cardiologist (physician) and Advanced Practice Providers (APPs -  Physician Assistants and Nurse Practitioners) who all work together to provide you with the care you need, when you need it.  We recommend signing up for the patient portal called "MyChart".  Sign up information is provided on this After Visit Summary.  MyChart is used to connect with patients for Virtual Visits (Telemedicine).  Patients are able to view lab/test results, encounter notes, upcoming appointments, etc.  Non-urgent messages can be sent to your provider as well.   To learn more about what you can do with MyChart, go to [email or web address].    Your next appointment:  1 year    Call  in July to schedule Nov appointment     The format for your next appointment: Office     Provider: Dr.Croitoru    Important Information About Sugar      l

## 2022-08-03 NOTE — Progress Notes (Signed)
Cardiology Office Note    Date:  08/07/2022   ID:  [name], DOB 1950/12/21, MRN [id number]  PCP:  Redmond School, MD  Cardiologist:   Sanda Klein, MD   Chief complaint: Follow-up mechanical aortic valve replacement  History of Present Illness:  [name] is a 71 y.o. female here for follow-up of mechanical aortic valve replacement and ascending aortic aneurysm repair (Bentall procedure, 2011 , 21 mm valve) performed for bicuspid aortic valve with critical aortic stenosis. Additional problems include hypertension, diabetes mellitus, mixed hyperlipidemia in the setting of morbid obesity.  She is generally doing well without any cardiovascular complaints.  She has several areas of rough skin patches, erythematous, worried that she may have psoriasis.  Would like to discontinue her beta-blocker.  Her blood pressure is well controlled.  She also takes lisinopril.  Denies any shortness of breath or chest pain at rest or with activity, lower extreme edema, orthopnea, PND, palpitations, dizziness or syncope.  Has not had any falls or bleeding on warfarin anticoagulation.  Denies any focal neurological complaints.  No claudication.  Has a history of gout, lactose intolerance and alpha-gal sensitization  Metabolic control is fair with a hemoglobin A1c of 6.0%, HDL 60, LDL 115 (no known CAD or PAD, but does have diabetes mellitus).  Creatinine was higher than her baseline, at 1.50 on 05/28/2022.  She sees Dr. Lowanda Foster in [place] in the nephrology clinic.   Her last echocardiogram was performed in January 2019 and showed normal gradients for the 21 mm mechanical aortic valve, albeit a little higher than they had been 5 years earlier.  Prothrombin time monitoring is performed in [place].    Past Medical History:  Diagnosis Date   AI (aortic insufficiency)    Arthritis    AS (aortic stenosis) 09/18/2012   2D Echo EF 55%-60%   Chronic kidney disease    renal  insuffcentily   Coronary artery disease    Cough    Diabetes mellitus ([place])    Gout    right foot   Hyperlipidemia    Hypertension    dr Donetta Potts   Obese    PONV (postoperative nausea and vomiting)    with early surgeries , none recent   Renal insufficiency    Tricuspid regurgitation     Past Surgical History:  Procedure Laterality Date   AORTIC VALVE REPLACEMENT  04/01/2010   21-mm Sr Jude Regent ;mechanical valve    BACK SURGERY     broken arm     wire in lt elbow   CARDIAC CATHETERIZATION  02/09/2010   FRACTURE SURGERY     GANGLION CYST EXCISION Left 01/08/2016   Procedure: LEFT WRIST EXCISION OF VOLAR CYST, EXCISION OF MUCOID TUMOR;  Surgeon: Daryll Brod, MD;  Location: [facility];  Service: Orthopedics;  Laterality: Left;   LUMBAR LAMINECTOMY/DECOMPRESSION MICRODISCECTOMY N/A 02/28/2013   Procedure: LUMBAR LAMINECTOMY/DECOMPRESSION MICRODISCECTOMY 2 LEVELS;  Surgeon: Ophelia Charter, MD;  Location: [facility];  Service: Neurosurgery;  Laterality: N/A;  Lumbar three-four and Lumbar four-five laminectomies   MASS EXCISION Left 01/08/2016   Procedure: EXCISION MUCOID TUMOR LEFT INDEX FINGER DISTAL INTERPHALANGEAL JOINT;  Surgeon: Daryll Brod, MD;  Location: [facility];  Service: Orthopedics;  Laterality: Left;   replacement  of ascending aortic aneurysm  04/01/2010   28-mm supracoronary Hemashield tube graft using deep hypothermic circulatory arrest    Current Medications: Outpatient Medications Prior to Visit  Medication Sig Dispense Refill  allopurinol (ZYLOPRIM) 100 MG tablet Take 100 mg by mouth daily.     b complex vitamins tablet Take 1 tablet by mouth daily.     Biotin 10000 MCG TABS Take 2,000 mg by mouth daily at 3 pm.     co-enzyme Q-10 30 MG capsule Take 30 mg by mouth daily.     furosemide (LASIX) 20 MG tablet Take 20 mg by mouth daily.     gabapentin (NEURONTIN) 300 MG capsule Take 300 mg by mouth daily.     glimepiride (AMARYL)  2 MG tablet Take 2 mg by mouth daily before breakfast.     HYDROcodone-acetaminophen (NORCO/VICODIN) 5-325 MG tablet Take 1 tablet by mouth every 8 (eight) hours as needed.     metoprolol tartrate (LOPRESSOR) 50 MG tablet TAKE 1/2 TABLET TWICE A DAY 90 tablet 3   Multiple Vitamin (MULTIVITAMIN WITH MINERALS) TABS Take 1 tablet by mouth daily.     multivitamin-lutein (OCUVITE-LUTEIN) CAPS Take 1 capsule by mouth daily.     Omega-3 Fatty Acids (FISH OIL) 1000 MG CAPS Take 1,000 mg by mouth daily.     topiramate (TOPAMAX) 25 MG tablet Take 25 mg by mouth 2 (two) times daily.     warfarin (JANTOVEN) 5 MG tablet TAKE 1 TO 1 AND 1/2 TABLETSONCE DAILY AS DIRECTED BY  COUMADIN CLINIC 120 tablet 1   zolpidem (AMBIEN) 10 MG tablet Take 10 mg by mouth at bedtime.      lisinopril (ZESTRIL) 2.5 MG tablet Take 2.5 mg by mouth daily.     pravastatin (PRAVACHOL) 80 MG tablet Take 80 mg by mouth daily.     Blood Glucose Monitoring Suppl (ONE TOUCH ULTRA 2) w/Device KIT daily. (Patient not taking: Reported on 08/03/2022)     cetirizine (ZYRTEC) 10 MG tablet Take 10 mg by mouth daily.     Lancets (ONETOUCH DELICA PLUS [id number]) MISC Apply topically daily. (Patient not taking: Reported on 08/03/2022)     ONETOUCH ULTRA test strip daily in the afternoon. (Patient not taking: Reported on 08/03/2022)     lisinopril (PRINIVIL,ZESTRIL) 10 MG tablet Take 10 mg by mouth daily. (Patient not taking: Reported on 08/03/2022)     No facility-administered medications prior to visit.     Allergies:   Morphine and related   Social History   Socioeconomic History   Marital status: Widowed    Spouse name: Not on file   Number of children: Not on file   Years of education: Not on file   Highest education level: Not on file  Occupational History   Not on file  Tobacco Use   Smoking status: Never   Smokeless tobacco: Never  Substance and Sexual Activity   Alcohol use: No   Drug use: No   Sexual activity: Not on file   Other Topics Concern   Not on file  Social History Narrative   Not on file   Social Determinants of Health   Financial Resource Strain: Not on file  Food Insecurity: Not on file  Transportation Needs: Not on file  Physical Activity: Not on file  Stress: Not on file  Social Connections: Not on file     Family History:  The patient's family history includes CAD in an other family member; Cancer - Lung in her maternal grandfather; Cancer - Prostate in her father; Diabetes in her mother and another family member; Hypertension in an other family member.   ROS:   Please see the history of present illness.  All other systems are reviewed and are negative  PHYSICAL EXAM:   VS:  BP 128/64   Pulse (!) 58   Ht _0  (1.575 m)   Wt 114 kg   SpO2 94%   BMI 45.98 kg/m      General: Alert, oriented x3, no distress, morbidly obese Head: no evidence of trauma, PERRL, EOMI, no exophtalmos or lid lag, no myxedema, no xanthelasma; normal ears, nose and oropharynx Neck: normal jugular venous pulsations and no hepatojugular reflux; brisk carotid pulses without delay and no carotid bruits Chest: clear to auscultation, no signs of consolidation by percussion or palpation, normal fremitus, symmetrical and full respiratory excursions Cardiovascular: normal position and quality of the apical impulse, regular rhythm, normal first and second heart sounds, no murmurs, rubs or gallops Abdomen: no tenderness or distention, no masses by palpation, no abnormal pulsatility or arterial bruits, normal bowel sounds, no hepatosplenomegaly Extremities: no clubbing, cyanosis or edema; 2+ radial, ulnar and brachial pulses bilaterally; 2+ right femoral, posterior tibial and dorsalis pedis pulses; 2+ left femoral, posterior tibial and dorsalis pedis pulses; no subclavian or femoral bruits Neurological: grossly nonfocal Psych: Normal mood and affect     Wt Readings from Last 3 Encounters:  08/03/22 114 kg   12/26/20 118.8 kg  05/16/19 113.5 kg      Studies/Labs Reviewed:   ECHO 01/16/2021     1. Left ventricular ejection fraction, by estimation, is 55 to 60%. The  left ventricle has normal function. The left ventricle has no regional  wall motion abnormalities. There is moderate left ventricular hypertrophy.  Left ventricular diastolic  parameters were normal.   2. Right ventricular systolic function is normal. The right ventricular  size is normal. There is normal pulmonary artery systolic pressure. The  estimated right ventricular systolic pressure is 31.5 mmHg.   3. The mitral valve is grossly normal. Trivial mitral valve  regurgitation.   4. The aortic valve has been repaired/replaced. Aortic valve  regurgitation is trivial. There is a 21 mm St. Jude mechanical valve  present in the aortic position. Procedure Date: 04/01/2010. Aortic valve  mean gradient measures 13.0 mmHg. Overall normal  function. No obvious paravalvular leak.   5. The inferior vena cava is normal in size with <50% respiratory  variability, suggesting right atrial pressure of 8 mmHg.   EKG:  EKG is ordered today.  It shows sinus bradycardia with delayed R wave progression and borderline left axis deviation, not meeting criteria for left anterior fascicular block.  Minor T wave changes.  Suspect LVH with voltage masked by obesity.  Recent Labs: 05/28/2022 Hemoglobin A1c 6.0% Hemoglobin 12.5, creatinine 1.5, potassium 4.7, ALT 23, TSH 1.070  Most recent INR 06/23/2022 2.1  Lipid Panel February 2017 total cholesterol 269, triglycerides 223, HDL 57, LDL 167  February 23, 2019 total cholesterol 207, HDL 57, LDL 114, triglycerides 182 June 2021 total cholesterol 207, HDL 57, LDL 109, triglycerides 224  05/28/2022 Cholesterol 205, HDL 60, LDL 115, triglycerides 175  ASSESSMENT:    1. H/O mechanical aortic valve replacement   2. Morbid obesity ([place])   3. Mixed hyperlipidemia   4. Diabetes mellitus type 2 in  obese ([place])   5. Essential hypertension   6. Hypercalcemia   7. History of gout   8. History of aortic aneurysm repair       PLAN:  In order of problems listed above:  S/P AVR: Normal valve function by clinical exam and echo performed a year ago.  Plan to repeat echo next year.  Compliant with warfarin anticoagulation and monitoring.  Aware of the need for endocarditis prophylaxis and dental procedure. Obesity: Weight has been up and down, but overall she is lost in the last year.  She remains morbidly obese. HLP: Not have any evidence of coronary cholesterosis when she had cardiac catheterization before aortic valve replacement.  She has not tolerated statins except for pravastatin.  Ideally would like LDL less than 100.  We will try rosuvastatin again.  Consider adding/switching to PCSK9 inhibitor. DM: Good glycemic control hemoglobin A1c 6.0%.  I think she be a good candidate for treatment with an SGLT2 inhibitor such as Ozempic to help with weight loss and to replace the glimepiride. HTN: Blood pressure control is good.  She would like to stop the beta-blocker.  Told her how to wean off of beta-blocker over 3-4 days.  Increase lisinopril to compensate.  Recheck renal function on the higher dose of lisinopril. HyperCa: History of hypercalcemia, hyperparathyroidism, avoid thiazide diuretics. Gout: No recent gout exacerbations.  Another good reason to avoid thiazides and diuretics in general. History of bicuspid aortic valve with ascending aortic aneurysm requiring Bentall replacement.    Medication Adjustments/Labs and Tests Ordered: Current medicines are reviewed at length with the patient today.  Concerns regarding medicines are outlined above.  Medication changes, Labs and Tests ordered today are listed in the Patient Instructions below. Patient Instructions  Medication Instructions:  Stop Pravastatin  Start Rosuvastatin 10 mg daily  Wean off Metoprolol 12.5 mg twice a day for 3  days then stop  Increase Lisinopril to 5 mg daily   *If you need a refill on your cardiac medications before your next appointment, please call your pharmacy*   Lab Work: Lipid panel in 3 months   Lab order enclosed   Testing/Procedures: Schedule Echo in 07/2023   Follow-Up: At [facility], you and your health needs are our priority.  As part of our continuing mission to provide you with exceptional heart care, we have created designated Provider Care Teams.  These Care Teams include your primary Cardiologist (physician) and Advanced Practice Providers (APPs -  Physician Assistants and Nurse Practitioners) who all work together to provide you with the care you need, when you need it.  We recommend signing up for the patient portal called "MyChart".  Sign up information is provided on this After Visit Summary.  MyChart is used to connect with patients for Virtual Visits (Telemedicine).  Patients are able to view lab/test results, encounter notes, upcoming appointments, etc.  Non-urgent messages can be sent to your provider as well.   To learn more about what you can do with MyChart, go to [email or web address].    Your next appointment:  1 year    Call  in July to schedule Nov appointment     The format for your next appointment: Office     Provider: Dr.Ercilia Bettinger    Important Information About Sugar      l   Signed, Sanda Klein, MD  08/07/2022 5:18 PM    [facility] [address], [place], [place]  [address] Phone: [phone]; Fax: [phone]

## 2022-08-04 ENCOUNTER — Other Ambulatory Visit: Payer: Self-pay | Admitting: Cardiovascular Disease

## 2022-08-07 ENCOUNTER — Encounter: Payer: Self-pay | Admitting: Cardiovascular Disease

## 2022-08-09 ENCOUNTER — Ambulatory Visit: Payer: Medicare Other | Attending: Cardiovascular Disease | Admitting: *Deleted

## 2022-08-09 DIAGNOSIS — Z952 Presence of prosthetic heart valve: Secondary | ICD-10-CM

## 2022-08-09 DIAGNOSIS — Z5181 Encounter for therapeutic drug level monitoring: Secondary | ICD-10-CM | POA: Diagnosis not present

## 2022-08-09 LAB — POCT INR: INR: 3.3 — AB (ref 2.0–3.0)

## 2022-08-09 NOTE — Patient Instructions (Signed)
Hold warfarin tonight then resume 1 tablet daily except 1/2 tablet on Mondays and Fridays Continue greens Recheck in 4 wks

## 2022-08-19 ENCOUNTER — Telehealth: Payer: Self-pay | Admitting: Cardiovascular Disease

## 2022-08-19 NOTE — Telephone Encounter (Signed)
*  STAT* If patient is at the pharmacy, call can be transferred to refill team.   1. Which medications need to be refilled? (please list name of each medication and dose if known) warfarin (JANTOVEN) 5 MG tablet   2. Which pharmacy/location (including street and city if local pharmacy) is medication to be sent to?  [facility] [place] [place] [facility] [address] AT PORTAL TO REGISTERED [place]    3. Do they need a 30 day or 90 day supply? 90 day supply

## 2022-08-20 ENCOUNTER — Other Ambulatory Visit: Payer: Self-pay

## 2022-08-20 MED ORDER — WARFARIN SODIUM 5 MG PO TABS: ORAL_TABLET | ORAL | 1 refills | Status: DC

## 2022-08-31 DIAGNOSIS — G894 Chronic pain syndrome: Secondary | ICD-10-CM | POA: Diagnosis not present

## 2022-08-31 DIAGNOSIS — M5451 Vertebrogenic low back pain: Secondary | ICD-10-CM | POA: Diagnosis not present

## 2022-08-31 DIAGNOSIS — Z79891 Long term (current) use of opiate analgesic: Secondary | ICD-10-CM | POA: Diagnosis not present

## 2022-08-31 DIAGNOSIS — M461 Sacroiliitis, not elsewhere classified: Secondary | ICD-10-CM | POA: Diagnosis not present

## 2022-08-31 DIAGNOSIS — Z79899 Other long term (current) drug therapy: Secondary | ICD-10-CM | POA: Diagnosis not present

## 2022-09-06 ENCOUNTER — Ambulatory Visit: Payer: Medicare Other | Attending: Cardiovascular Disease | Admitting: *Deleted

## 2022-09-06 DIAGNOSIS — Z5181 Encounter for therapeutic drug level monitoring: Secondary | ICD-10-CM | POA: Diagnosis not present

## 2022-09-06 DIAGNOSIS — Z952 Presence of prosthetic heart valve: Secondary | ICD-10-CM | POA: Diagnosis not present

## 2022-09-06 LAB — POCT INR: INR: 2.6 (ref 2.0–3.0)

## 2022-09-06 NOTE — Patient Instructions (Signed)
Continue warfarin 1 tablet daily except 1/2 tablet on Mondays and Fridays Continue greens Recheck in 4 wks

## 2022-10-06 DIAGNOSIS — N189 Chronic kidney disease, unspecified: Secondary | ICD-10-CM | POA: Diagnosis not present

## 2022-10-06 DIAGNOSIS — E1122 Type 2 diabetes mellitus with diabetic chronic kidney disease: Secondary | ICD-10-CM | POA: Diagnosis not present

## 2022-10-06 DIAGNOSIS — I129 Hypertensive chronic kidney disease with stage 1 through stage 4 chronic kidney disease, or unspecified chronic kidney disease: Secondary | ICD-10-CM | POA: Diagnosis not present

## 2022-10-06 DIAGNOSIS — I5032 Chronic diastolic (congestive) heart failure: Secondary | ICD-10-CM | POA: Diagnosis not present

## 2022-10-06 DIAGNOSIS — E1129 Type 2 diabetes mellitus with other diabetic kidney complication: Secondary | ICD-10-CM | POA: Diagnosis not present

## 2022-10-06 DIAGNOSIS — R809 Proteinuria, unspecified: Secondary | ICD-10-CM | POA: Diagnosis not present

## 2022-10-11 DIAGNOSIS — H40011 Open angle with borderline findings, low risk, right eye: Secondary | ICD-10-CM | POA: Diagnosis not present

## 2022-10-12 DIAGNOSIS — R809 Proteinuria, unspecified: Secondary | ICD-10-CM | POA: Diagnosis not present

## 2022-10-12 DIAGNOSIS — E1129 Type 2 diabetes mellitus with other diabetic kidney complication: Secondary | ICD-10-CM | POA: Diagnosis not present

## 2022-10-12 DIAGNOSIS — I129 Hypertensive chronic kidney disease with stage 1 through stage 4 chronic kidney disease, or unspecified chronic kidney disease: Secondary | ICD-10-CM | POA: Diagnosis not present

## 2022-10-12 DIAGNOSIS — I5032 Chronic diastolic (congestive) heart failure: Secondary | ICD-10-CM | POA: Diagnosis not present

## 2022-10-12 DIAGNOSIS — E1122 Type 2 diabetes mellitus with diabetic chronic kidney disease: Secondary | ICD-10-CM | POA: Diagnosis not present

## 2022-10-12 DIAGNOSIS — N189 Chronic kidney disease, unspecified: Secondary | ICD-10-CM | POA: Diagnosis not present

## 2022-10-18 ENCOUNTER — Ambulatory Visit: Payer: Medicare Other | Attending: Cardiovascular Disease | Admitting: *Deleted

## 2022-10-18 DIAGNOSIS — Z952 Presence of prosthetic heart valve: Secondary | ICD-10-CM

## 2022-10-18 DIAGNOSIS — Z5181 Encounter for therapeutic drug level monitoring: Secondary | ICD-10-CM | POA: Diagnosis not present

## 2022-10-18 LAB — POCT INR: INR: 1.9 — AB (ref 2.0–3.0)

## 2022-10-18 NOTE — Patient Instructions (Signed)
Take warfarin 1 tablet tonight then resume 1 tablet daily except 1/2 tablet on Mondays and Fridays Continue greens Recheck in 4 wks

## 2022-10-20 DIAGNOSIS — E1129 Type 2 diabetes mellitus with other diabetic kidney complication: Secondary | ICD-10-CM | POA: Diagnosis not present

## 2022-10-20 DIAGNOSIS — I129 Hypertensive chronic kidney disease with stage 1 through stage 4 chronic kidney disease, or unspecified chronic kidney disease: Secondary | ICD-10-CM | POA: Diagnosis not present

## 2022-10-20 DIAGNOSIS — E1122 Type 2 diabetes mellitus with diabetic chronic kidney disease: Secondary | ICD-10-CM | POA: Diagnosis not present

## 2022-10-26 DIAGNOSIS — G894 Chronic pain syndrome: Secondary | ICD-10-CM | POA: Diagnosis not present

## 2022-10-26 DIAGNOSIS — M5451 Vertebrogenic low back pain: Secondary | ICD-10-CM | POA: Diagnosis not present

## 2022-10-26 DIAGNOSIS — M461 Sacroiliitis, not elsewhere classified: Secondary | ICD-10-CM | POA: Diagnosis not present

## 2022-10-26 DIAGNOSIS — Z79891 Long term (current) use of opiate analgesic: Secondary | ICD-10-CM | POA: Diagnosis not present

## 2022-11-19 DIAGNOSIS — E1169 Type 2 diabetes mellitus with other specified complication: Secondary | ICD-10-CM | POA: Diagnosis not present

## 2022-11-19 DIAGNOSIS — Z952 Presence of prosthetic heart valve: Secondary | ICD-10-CM | POA: Diagnosis not present

## 2022-11-19 DIAGNOSIS — I10 Essential (primary) hypertension: Secondary | ICD-10-CM | POA: Diagnosis not present

## 2022-11-19 DIAGNOSIS — I350 Nonrheumatic aortic (valve) stenosis: Secondary | ICD-10-CM | POA: Diagnosis not present

## 2022-11-19 DIAGNOSIS — E669 Obesity, unspecified: Secondary | ICD-10-CM | POA: Diagnosis not present

## 2022-11-20 LAB — LIPID PANEL
Chol/HDL Ratio: 3.1 ratio (ref 0.0–4.4)
Cholesterol, Total: 184 mg/dL (ref 100–199)
HDL: 60 mg/dL (ref >39)
LDL Chol Calc (NIH): 94 mg/dL (ref 0–99)
Triglycerides: 174 mg/dL — ABNORMAL HIGH (ref 0–149)
VLDL Cholesterol Cal: 30 mg/dL (ref 5–40)

## 2022-11-23 DIAGNOSIS — Z79899 Other long term (current) drug therapy: Secondary | ICD-10-CM | POA: Diagnosis not present

## 2022-11-23 DIAGNOSIS — G894 Chronic pain syndrome: Secondary | ICD-10-CM | POA: Diagnosis not present

## 2022-11-23 DIAGNOSIS — Z79891 Long term (current) use of opiate analgesic: Secondary | ICD-10-CM | POA: Diagnosis not present

## 2022-11-23 DIAGNOSIS — M461 Sacroiliitis, not elsewhere classified: Secondary | ICD-10-CM | POA: Diagnosis not present

## 2022-11-23 DIAGNOSIS — M5451 Vertebrogenic low back pain: Secondary | ICD-10-CM | POA: Diagnosis not present

## 2022-11-24 ENCOUNTER — Ambulatory Visit: Payer: Medicare Other | Attending: Cardiovascular Disease | Admitting: *Deleted

## 2022-11-24 DIAGNOSIS — Z5181 Encounter for therapeutic drug level monitoring: Secondary | ICD-10-CM

## 2022-11-24 DIAGNOSIS — Z952 Presence of prosthetic heart valve: Secondary | ICD-10-CM

## 2022-11-24 LAB — POCT INR: INR: 1.7 — AB (ref 2.0–3.0)

## 2022-11-24 NOTE — Patient Instructions (Signed)
Take warfarin 1 1/2 tablets tonight then increase dose to 1 tablet daily except 1/2 tablet on Mondays  Continue greens Recheck in 3 wks

## 2022-12-15 ENCOUNTER — Ambulatory Visit: Payer: Medicare Other | Attending: Cardiovascular Disease | Admitting: *Deleted

## 2022-12-15 DIAGNOSIS — Z5181 Encounter for therapeutic drug level monitoring: Secondary | ICD-10-CM | POA: Diagnosis not present

## 2022-12-15 DIAGNOSIS — Z952 Presence of prosthetic heart valve: Secondary | ICD-10-CM

## 2022-12-15 LAB — POCT INR: INR: 2.9 (ref 2.0–3.0)

## 2022-12-15 NOTE — Patient Instructions (Signed)
Continue warfarin 1 tablet daily except 1/2 tablet on Mondays  Continue greens Recheck in 4 wks

## 2022-12-19 DIAGNOSIS — E1122 Type 2 diabetes mellitus with diabetic chronic kidney disease: Secondary | ICD-10-CM | POA: Diagnosis not present

## 2022-12-19 DIAGNOSIS — E1129 Type 2 diabetes mellitus with other diabetic kidney complication: Secondary | ICD-10-CM | POA: Diagnosis not present

## 2022-12-19 DIAGNOSIS — I129 Hypertensive chronic kidney disease with stage 1 through stage 4 chronic kidney disease, or unspecified chronic kidney disease: Secondary | ICD-10-CM | POA: Diagnosis not present

## 2022-12-21 DIAGNOSIS — G894 Chronic pain syndrome: Secondary | ICD-10-CM | POA: Diagnosis not present

## 2022-12-21 DIAGNOSIS — M5451 Vertebrogenic low back pain: Secondary | ICD-10-CM | POA: Diagnosis not present

## 2022-12-21 DIAGNOSIS — M461 Sacroiliitis, not elsewhere classified: Secondary | ICD-10-CM | POA: Diagnosis not present

## 2022-12-21 DIAGNOSIS — Z79891 Long term (current) use of opiate analgesic: Secondary | ICD-10-CM | POA: Diagnosis not present

## 2023-01-05 ENCOUNTER — Other Ambulatory Visit: Payer: Self-pay | Admitting: Cardiovascular Disease

## 2023-01-05 DIAGNOSIS — Z952 Presence of prosthetic heart valve: Secondary | ICD-10-CM

## 2023-01-05 NOTE — Telephone Encounter (Signed)
Warfarin  refill  Last INR 12/15/22 Last OV 08/03/22

## 2023-01-11 ENCOUNTER — Ambulatory Visit: Payer: Medicare Other | Attending: Cardiovascular Disease | Admitting: *Deleted

## 2023-01-11 DIAGNOSIS — Z952 Presence of prosthetic heart valve: Secondary | ICD-10-CM

## 2023-01-11 DIAGNOSIS — Z5181 Encounter for therapeutic drug level monitoring: Secondary | ICD-10-CM | POA: Insufficient documentation

## 2023-01-11 LAB — POCT INR: INR: 3.8 — AB (ref 2.0–3.0)

## 2023-01-11 NOTE — Patient Instructions (Signed)
Hold warfarin today then decrease dose to 1 tablet daily except 1/2 tablet on Mondays and Thursdays Continue greens Recheck in 3 wks

## 2023-01-19 DIAGNOSIS — G894 Chronic pain syndrome: Secondary | ICD-10-CM | POA: Diagnosis not present

## 2023-01-19 DIAGNOSIS — Z79899 Other long term (current) drug therapy: Secondary | ICD-10-CM | POA: Diagnosis not present

## 2023-01-19 DIAGNOSIS — M5451 Vertebrogenic low back pain: Secondary | ICD-10-CM | POA: Diagnosis not present

## 2023-01-19 DIAGNOSIS — M461 Sacroiliitis, not elsewhere classified: Secondary | ICD-10-CM | POA: Diagnosis not present

## 2023-01-19 DIAGNOSIS — Z79891 Long term (current) use of opiate analgesic: Secondary | ICD-10-CM | POA: Diagnosis not present

## 2023-01-27 DIAGNOSIS — R809 Proteinuria, unspecified: Secondary | ICD-10-CM | POA: Diagnosis not present

## 2023-01-27 DIAGNOSIS — I5032 Chronic diastolic (congestive) heart failure: Secondary | ICD-10-CM | POA: Diagnosis not present

## 2023-01-27 DIAGNOSIS — E1122 Type 2 diabetes mellitus with diabetic chronic kidney disease: Secondary | ICD-10-CM | POA: Diagnosis not present

## 2023-01-27 DIAGNOSIS — N189 Chronic kidney disease, unspecified: Secondary | ICD-10-CM | POA: Diagnosis not present

## 2023-01-27 DIAGNOSIS — I129 Hypertensive chronic kidney disease with stage 1 through stage 4 chronic kidney disease, or unspecified chronic kidney disease: Secondary | ICD-10-CM | POA: Diagnosis not present

## 2023-01-27 DIAGNOSIS — E1129 Type 2 diabetes mellitus with other diabetic kidney complication: Secondary | ICD-10-CM | POA: Diagnosis not present

## 2023-02-01 ENCOUNTER — Ambulatory Visit: Payer: Medicare Other | Attending: Cardiovascular Disease | Admitting: *Deleted

## 2023-02-01 DIAGNOSIS — Z952 Presence of prosthetic heart valve: Secondary | ICD-10-CM | POA: Insufficient documentation

## 2023-02-01 DIAGNOSIS — Z5181 Encounter for therapeutic drug level monitoring: Secondary | ICD-10-CM | POA: Diagnosis not present

## 2023-02-01 LAB — POCT INR: INR: 3 (ref 2.0–3.0)

## 2023-02-01 NOTE — Patient Instructions (Signed)
Continue warfarin 1 tablet daily except 1/2 tablet on Mondays and Thursdays Continue greens Recheck in 4 wks

## 2023-02-04 DIAGNOSIS — E1129 Type 2 diabetes mellitus with other diabetic kidney complication: Secondary | ICD-10-CM | POA: Diagnosis not present

## 2023-02-04 DIAGNOSIS — R809 Proteinuria, unspecified: Secondary | ICD-10-CM | POA: Diagnosis not present

## 2023-02-04 DIAGNOSIS — E1122 Type 2 diabetes mellitus with diabetic chronic kidney disease: Secondary | ICD-10-CM | POA: Diagnosis not present

## 2023-02-04 DIAGNOSIS — I129 Hypertensive chronic kidney disease with stage 1 through stage 4 chronic kidney disease, or unspecified chronic kidney disease: Secondary | ICD-10-CM | POA: Diagnosis not present

## 2023-02-18 DIAGNOSIS — E1165 Type 2 diabetes mellitus with hyperglycemia: Secondary | ICD-10-CM | POA: Diagnosis not present

## 2023-02-18 DIAGNOSIS — N1831 Chronic kidney disease, stage 3a: Secondary | ICD-10-CM | POA: Diagnosis not present

## 2023-02-18 DIAGNOSIS — I10 Essential (primary) hypertension: Secondary | ICD-10-CM | POA: Diagnosis not present

## 2023-02-18 DIAGNOSIS — E1122 Type 2 diabetes mellitus with diabetic chronic kidney disease: Secondary | ICD-10-CM | POA: Diagnosis not present

## 2023-02-18 DIAGNOSIS — E1129 Type 2 diabetes mellitus with other diabetic kidney complication: Secondary | ICD-10-CM | POA: Diagnosis not present

## 2023-02-18 DIAGNOSIS — Z6841 Body Mass Index (BMI) 40.0 and over, adult: Secondary | ICD-10-CM | POA: Diagnosis not present

## 2023-02-18 DIAGNOSIS — Z952 Presence of prosthetic heart valve: Secondary | ICD-10-CM | POA: Diagnosis not present

## 2023-02-18 DIAGNOSIS — M1991 Primary osteoarthritis, unspecified site: Secondary | ICD-10-CM | POA: Diagnosis not present

## 2023-02-18 DIAGNOSIS — E210 Primary hyperparathyroidism: Secondary | ICD-10-CM | POA: Diagnosis not present

## 2023-02-18 DIAGNOSIS — I129 Hypertensive chronic kidney disease with stage 1 through stage 4 chronic kidney disease, or unspecified chronic kidney disease: Secondary | ICD-10-CM | POA: Diagnosis not present

## 2023-03-02 DIAGNOSIS — M461 Sacroiliitis, not elsewhere classified: Secondary | ICD-10-CM | POA: Diagnosis not present

## 2023-03-02 DIAGNOSIS — M5451 Vertebrogenic low back pain: Secondary | ICD-10-CM | POA: Diagnosis not present

## 2023-03-02 DIAGNOSIS — G894 Chronic pain syndrome: Secondary | ICD-10-CM | POA: Diagnosis not present

## 2023-03-02 DIAGNOSIS — Z79891 Long term (current) use of opiate analgesic: Secondary | ICD-10-CM | POA: Diagnosis not present

## 2023-03-08 ENCOUNTER — Ambulatory Visit: Payer: Medicare Other | Attending: Cardiovascular Disease | Admitting: *Deleted

## 2023-03-08 DIAGNOSIS — Z952 Presence of prosthetic heart valve: Secondary | ICD-10-CM | POA: Diagnosis not present

## 2023-03-08 DIAGNOSIS — Z5181 Encounter for therapeutic drug level monitoring: Secondary | ICD-10-CM | POA: Diagnosis not present

## 2023-03-08 LAB — POCT INR: INR: 6.5 — AB (ref 2.0–3.0)

## 2023-03-08 NOTE — Patient Instructions (Addendum)
Hold warfarin 3 days (Tues,Wed,Thurs) then resume 1 tablet daily except 1/2 tablet on Mondays and Thursdays Eat extra greens Recheck in 1 wk Pt denies any S/S of excessive bleeding or bruising.  Bleeding and fall precautions discussed with pt and she verbalized understanding.

## 2023-03-16 ENCOUNTER — Ambulatory Visit: Payer: Medicare Other | Attending: Cardiovascular Disease | Admitting: *Deleted

## 2023-03-16 DIAGNOSIS — Z5181 Encounter for therapeutic drug level monitoring: Secondary | ICD-10-CM | POA: Diagnosis not present

## 2023-03-16 DIAGNOSIS — Z952 Presence of prosthetic heart valve: Secondary | ICD-10-CM | POA: Insufficient documentation

## 2023-03-16 LAB — POCT INR: INR: 1.7 — AB (ref 2.0–3.0)

## 2023-03-16 NOTE — Patient Instructions (Signed)
Take warfarin 1 1/2 tablets today then resume 1 tablet daily except 1/2 tablet on Mondays and Thursdays Be consistent with greens Recheck in 3 wk

## 2023-03-20 DIAGNOSIS — E1122 Type 2 diabetes mellitus with diabetic chronic kidney disease: Secondary | ICD-10-CM | POA: Diagnosis not present

## 2023-03-20 DIAGNOSIS — I129 Hypertensive chronic kidney disease with stage 1 through stage 4 chronic kidney disease, or unspecified chronic kidney disease: Secondary | ICD-10-CM | POA: Diagnosis not present

## 2023-03-20 DIAGNOSIS — E1129 Type 2 diabetes mellitus with other diabetic kidney complication: Secondary | ICD-10-CM | POA: Diagnosis not present

## 2023-04-06 ENCOUNTER — Ambulatory Visit: Payer: Medicare Other | Attending: Cardiovascular Disease | Admitting: *Deleted

## 2023-04-06 DIAGNOSIS — Z5181 Encounter for therapeutic drug level monitoring: Secondary | ICD-10-CM

## 2023-04-06 DIAGNOSIS — Z952 Presence of prosthetic heart valve: Secondary | ICD-10-CM

## 2023-04-06 LAB — POCT INR: INR: 2.8 (ref 2.0–3.0)

## 2023-04-06 NOTE — Patient Instructions (Signed)
Continue warfarin 1 tablet daily except 1/2 tablet on Mondays and Thursdays Be consistent with greens Recheck in 4 wk

## 2023-04-07 DIAGNOSIS — H353132 Nonexudative age-related macular degeneration, bilateral, intermediate dry stage: Secondary | ICD-10-CM | POA: Diagnosis not present

## 2023-04-27 DIAGNOSIS — G894 Chronic pain syndrome: Secondary | ICD-10-CM | POA: Diagnosis not present

## 2023-04-27 DIAGNOSIS — M5451 Vertebrogenic low back pain: Secondary | ICD-10-CM | POA: Diagnosis not present

## 2023-04-27 DIAGNOSIS — Z79891 Long term (current) use of opiate analgesic: Secondary | ICD-10-CM | POA: Diagnosis not present

## 2023-04-27 DIAGNOSIS — Z79899 Other long term (current) drug therapy: Secondary | ICD-10-CM | POA: Diagnosis not present

## 2023-04-27 DIAGNOSIS — M461 Sacroiliitis, not elsewhere classified: Secondary | ICD-10-CM | POA: Diagnosis not present

## 2023-05-03 DIAGNOSIS — R809 Proteinuria, unspecified: Secondary | ICD-10-CM | POA: Diagnosis not present

## 2023-05-03 DIAGNOSIS — N189 Chronic kidney disease, unspecified: Secondary | ICD-10-CM | POA: Diagnosis not present

## 2023-05-03 DIAGNOSIS — D631 Anemia in chronic kidney disease: Secondary | ICD-10-CM | POA: Diagnosis not present

## 2023-05-03 DIAGNOSIS — N183 Chronic kidney disease, stage 3 unspecified: Secondary | ICD-10-CM | POA: Diagnosis not present

## 2023-05-04 ENCOUNTER — Ambulatory Visit: Payer: Medicare Other | Attending: Cardiovascular Disease | Admitting: *Deleted

## 2023-05-04 DIAGNOSIS — Z952 Presence of prosthetic heart valve: Secondary | ICD-10-CM | POA: Insufficient documentation

## 2023-05-04 DIAGNOSIS — Z5181 Encounter for therapeutic drug level monitoring: Secondary | ICD-10-CM | POA: Insufficient documentation

## 2023-05-04 LAB — POCT INR: INR: 4.8 — AB (ref 2.0–3.0)

## 2023-05-04 NOTE — Patient Instructions (Signed)
Hold warfarin tonight and tomorrow night then resume 1 tablet daily except 1/2 tablet on Mondays and Thursdays Be consistent with greens Recheck in 3 wks

## 2023-05-11 DIAGNOSIS — E1129 Type 2 diabetes mellitus with other diabetic kidney complication: Secondary | ICD-10-CM | POA: Diagnosis not present

## 2023-05-11 DIAGNOSIS — R809 Proteinuria, unspecified: Secondary | ICD-10-CM | POA: Diagnosis not present

## 2023-05-11 DIAGNOSIS — E1122 Type 2 diabetes mellitus with diabetic chronic kidney disease: Secondary | ICD-10-CM | POA: Diagnosis not present

## 2023-05-26 ENCOUNTER — Ambulatory Visit: Payer: Medicare Other | Attending: Cardiovascular Disease | Admitting: *Deleted

## 2023-05-26 DIAGNOSIS — Z5181 Encounter for therapeutic drug level monitoring: Secondary | ICD-10-CM | POA: Diagnosis not present

## 2023-05-26 DIAGNOSIS — Z952 Presence of prosthetic heart valve: Secondary | ICD-10-CM

## 2023-05-26 LAB — POCT INR: INR: 3.2 — AB (ref 2.0–3.0)

## 2023-05-26 NOTE — Patient Instructions (Signed)
Decrease warfarin to 1 tablet daily except 1/2 tablet on Tuesdays, Thursdays and Saturdays Be consistent with greens Recheck in 3 wks

## 2023-05-30 ENCOUNTER — Other Ambulatory Visit: Payer: Self-pay | Admitting: Medical Genetics

## 2023-05-30 DIAGNOSIS — Z006 Encounter for examination for normal comparison and control in clinical research program: Secondary | ICD-10-CM

## 2023-06-07 DIAGNOSIS — E1165 Type 2 diabetes mellitus with hyperglycemia: Secondary | ICD-10-CM | POA: Diagnosis not present

## 2023-06-07 DIAGNOSIS — E782 Mixed hyperlipidemia: Secondary | ICD-10-CM | POA: Diagnosis not present

## 2023-06-07 DIAGNOSIS — Z6841 Body Mass Index (BMI) 40.0 and over, adult: Secondary | ICD-10-CM | POA: Diagnosis not present

## 2023-06-07 DIAGNOSIS — E210 Primary hyperparathyroidism: Secondary | ICD-10-CM | POA: Diagnosis not present

## 2023-06-07 DIAGNOSIS — N1831 Chronic kidney disease, stage 3a: Secondary | ICD-10-CM | POA: Diagnosis not present

## 2023-06-07 DIAGNOSIS — M1991 Primary osteoarthritis, unspecified site: Secondary | ICD-10-CM | POA: Diagnosis not present

## 2023-06-07 DIAGNOSIS — E039 Hypothyroidism, unspecified: Secondary | ICD-10-CM | POA: Diagnosis not present

## 2023-06-07 DIAGNOSIS — I10 Essential (primary) hypertension: Secondary | ICD-10-CM | POA: Diagnosis not present

## 2023-06-07 DIAGNOSIS — D518 Other vitamin B12 deficiency anemias: Secondary | ICD-10-CM | POA: Diagnosis not present

## 2023-06-07 DIAGNOSIS — E559 Vitamin D deficiency, unspecified: Secondary | ICD-10-CM | POA: Diagnosis not present

## 2023-06-07 DIAGNOSIS — Z0001 Encounter for general adult medical examination with abnormal findings: Secondary | ICD-10-CM | POA: Diagnosis not present

## 2023-06-07 DIAGNOSIS — I129 Hypertensive chronic kidney disease with stage 1 through stage 4 chronic kidney disease, or unspecified chronic kidney disease: Secondary | ICD-10-CM | POA: Diagnosis not present

## 2023-06-07 DIAGNOSIS — Z1331 Encounter for screening for depression: Secondary | ICD-10-CM | POA: Diagnosis not present

## 2023-06-07 DIAGNOSIS — Z23 Encounter for immunization: Secondary | ICD-10-CM | POA: Diagnosis not present

## 2023-06-07 DIAGNOSIS — E1122 Type 2 diabetes mellitus with diabetic chronic kidney disease: Secondary | ICD-10-CM | POA: Diagnosis not present

## 2023-06-16 ENCOUNTER — Ambulatory Visit: Payer: Medicare Other | Attending: Cardiovascular Disease | Admitting: *Deleted

## 2023-06-16 DIAGNOSIS — Z952 Presence of prosthetic heart valve: Secondary | ICD-10-CM | POA: Diagnosis not present

## 2023-06-16 DIAGNOSIS — Z5181 Encounter for therapeutic drug level monitoring: Secondary | ICD-10-CM | POA: Insufficient documentation

## 2023-06-16 LAB — POCT INR: INR: 2 (ref 2.0–3.0)

## 2023-06-16 NOTE — Patient Instructions (Signed)
Continue warfarin 1 tablet daily except 1/2 tablet on Tuesdays, Thursdays and Saturdays Be consistent with greens Recheck in 4 wks

## 2023-07-04 ENCOUNTER — Other Ambulatory Visit: Payer: Self-pay | Admitting: Cardiovascular Disease

## 2023-07-04 DIAGNOSIS — Z952 Presence of prosthetic heart valve: Secondary | ICD-10-CM

## 2023-07-05 DIAGNOSIS — M461 Sacroiliitis, not elsewhere classified: Secondary | ICD-10-CM | POA: Diagnosis not present

## 2023-07-05 DIAGNOSIS — M5451 Vertebrogenic low back pain: Secondary | ICD-10-CM | POA: Diagnosis not present

## 2023-07-05 DIAGNOSIS — G894 Chronic pain syndrome: Secondary | ICD-10-CM | POA: Diagnosis not present

## 2023-07-05 DIAGNOSIS — Z79891 Long term (current) use of opiate analgesic: Secondary | ICD-10-CM | POA: Diagnosis not present

## 2023-07-14 ENCOUNTER — Ambulatory Visit: Payer: Medicare Other | Attending: Cardiovascular Disease | Admitting: *Deleted

## 2023-07-14 DIAGNOSIS — Z5181 Encounter for therapeutic drug level monitoring: Secondary | ICD-10-CM | POA: Diagnosis not present

## 2023-07-14 DIAGNOSIS — Z952 Presence of prosthetic heart valve: Secondary | ICD-10-CM | POA: Diagnosis not present

## 2023-07-14 LAB — POCT INR: INR: 1.7 — AB (ref 2.0–3.0)

## 2023-07-14 NOTE — Patient Instructions (Signed)
Increase warfarin to 1 tablet daily except 1/2 tablet on Tuesdays and Saturdays Be consistent with greens Recheck in 3 wks

## 2023-08-04 ENCOUNTER — Other Ambulatory Visit: Payer: Self-pay | Admitting: Internal Medicine

## 2023-08-04 ENCOUNTER — Ambulatory Visit (HOSPITAL_COMMUNITY)
Admission: RE | Admit: 2023-08-04 | Discharge: 2023-08-04 | Disposition: A | Discharge status: Home / Self Care | Payer: Medicare Other | Source: Ambulatory Visit | Attending: Cardiovascular Disease | Admitting: Cardiovascular Disease

## 2023-08-04 DIAGNOSIS — I10 Essential (primary) hypertension: Secondary | ICD-10-CM | POA: Insufficient documentation

## 2023-08-04 DIAGNOSIS — E1169 Type 2 diabetes mellitus with other specified complication: Secondary | ICD-10-CM | POA: Insufficient documentation

## 2023-08-04 DIAGNOSIS — Z1231 Encounter for screening mammogram for malignant neoplasm of breast: Secondary | ICD-10-CM

## 2023-08-04 DIAGNOSIS — Z952 Presence of prosthetic heart valve: Secondary | ICD-10-CM | POA: Diagnosis not present

## 2023-08-04 DIAGNOSIS — E669 Obesity, unspecified: Secondary | ICD-10-CM | POA: Diagnosis not present

## 2023-08-04 LAB — ECHOCARDIOGRAM COMPLETE
AR max vel: 1.08 cm2
AV Area VTI: 1.09 cm2
AV Area mean vel: 0.98 cm2
AV Mean grad: 12 mm[Hg]
AV Peak grad: 24.8 mm[Hg]
Ao pk vel: 2.49 m/s
Area-P 1/2: 5.54 cm2
S' Lateral: 2.4 cm

## 2023-08-04 NOTE — Progress Notes (Signed)
*  PRELIMINARY RESULTS* Echocardiogram 2D Echocardiogram has been performed.  Stacey Drain 08/04/2023, 10:26 AM

## 2023-08-08 ENCOUNTER — Ambulatory Visit
Admission: RE | Admit: 2023-08-08 | Discharge: 2023-08-08 | Disposition: A | Discharge status: Home / Self Care | Payer: Medicare Other | Source: Ambulatory Visit | Attending: Internal Medicine | Admitting: Internal Medicine

## 2023-08-08 DIAGNOSIS — I129 Hypertensive chronic kidney disease with stage 1 through stage 4 chronic kidney disease, or unspecified chronic kidney disease: Secondary | ICD-10-CM | POA: Diagnosis not present

## 2023-08-08 DIAGNOSIS — N179 Acute kidney failure, unspecified: Secondary | ICD-10-CM | POA: Diagnosis not present

## 2023-08-08 DIAGNOSIS — I5032 Chronic diastolic (congestive) heart failure: Secondary | ICD-10-CM | POA: Diagnosis not present

## 2023-08-08 DIAGNOSIS — Z1231 Encounter for screening mammogram for malignant neoplasm of breast: Secondary | ICD-10-CM | POA: Diagnosis not present

## 2023-08-08 DIAGNOSIS — N189 Chronic kidney disease, unspecified: Secondary | ICD-10-CM | POA: Diagnosis not present

## 2023-08-08 DIAGNOSIS — E1122 Type 2 diabetes mellitus with diabetic chronic kidney disease: Secondary | ICD-10-CM | POA: Diagnosis not present

## 2023-08-08 DIAGNOSIS — E1129 Type 2 diabetes mellitus with other diabetic kidney complication: Secondary | ICD-10-CM | POA: Diagnosis not present

## 2023-08-09 ENCOUNTER — Ambulatory Visit: Payer: Medicare Other | Attending: Cardiology | Admitting: *Deleted

## 2023-08-09 DIAGNOSIS — Z952 Presence of prosthetic heart valve: Secondary | ICD-10-CM | POA: Insufficient documentation

## 2023-08-09 DIAGNOSIS — Z5181 Encounter for therapeutic drug level monitoring: Secondary | ICD-10-CM | POA: Diagnosis not present

## 2023-08-09 LAB — POCT INR: INR: 4.9 — AB (ref 2.0–3.0)

## 2023-08-09 NOTE — Patient Instructions (Signed)
Hold warfarin tonight and tomorrow night then resume 1 tablet daily except 1/2 tablet on Tuesdays and Saturdays Be consistent with greens Recheck in 3 wks

## 2023-08-19 ENCOUNTER — Other Ambulatory Visit (HOSPITAL_COMMUNITY): Payer: Self-pay | Attending: Medical Genetics

## 2023-08-24 ENCOUNTER — Other Ambulatory Visit: Payer: Self-pay | Admitting: Cardiovascular Disease

## 2023-08-29 ENCOUNTER — Other Ambulatory Visit: Payer: Self-pay | Admitting: Cardiovascular Disease

## 2023-08-30 DIAGNOSIS — G894 Chronic pain syndrome: Secondary | ICD-10-CM | POA: Diagnosis not present

## 2023-08-30 DIAGNOSIS — Z79891 Long term (current) use of opiate analgesic: Secondary | ICD-10-CM | POA: Diagnosis not present

## 2023-08-30 DIAGNOSIS — M5451 Vertebrogenic low back pain: Secondary | ICD-10-CM | POA: Diagnosis not present

## 2023-08-30 DIAGNOSIS — M461 Sacroiliitis, not elsewhere classified: Secondary | ICD-10-CM | POA: Diagnosis not present

## 2023-08-31 ENCOUNTER — Ambulatory Visit: Payer: Medicare Other | Attending: Cardiovascular Disease | Admitting: *Deleted

## 2023-08-31 DIAGNOSIS — Z952 Presence of prosthetic heart valve: Secondary | ICD-10-CM | POA: Diagnosis not present

## 2023-08-31 DIAGNOSIS — Z5181 Encounter for therapeutic drug level monitoring: Secondary | ICD-10-CM | POA: Insufficient documentation

## 2023-08-31 LAB — POCT INR: INR: 3 (ref 2.0–3.0)

## 2023-08-31 NOTE — Patient Instructions (Signed)
Continue warfarin 1 tablet daily except 1/2 tablet on Tuesdays and Saturdays Be consistent with greens Recheck in 4 wks

## 2023-09-16 DIAGNOSIS — Z952 Presence of prosthetic heart valve: Secondary | ICD-10-CM | POA: Diagnosis not present

## 2023-09-16 DIAGNOSIS — I129 Hypertensive chronic kidney disease with stage 1 through stage 4 chronic kidney disease, or unspecified chronic kidney disease: Secondary | ICD-10-CM | POA: Diagnosis not present

## 2023-09-16 DIAGNOSIS — E1122 Type 2 diabetes mellitus with diabetic chronic kidney disease: Secondary | ICD-10-CM | POA: Diagnosis not present

## 2023-09-16 DIAGNOSIS — J209 Acute bronchitis, unspecified: Secondary | ICD-10-CM | POA: Diagnosis not present

## 2023-09-16 DIAGNOSIS — N1831 Chronic kidney disease, stage 3a: Secondary | ICD-10-CM | POA: Diagnosis not present

## 2023-09-16 DIAGNOSIS — E1165 Type 2 diabetes mellitus with hyperglycemia: Secondary | ICD-10-CM | POA: Diagnosis not present

## 2023-09-16 DIAGNOSIS — M1991 Primary osteoarthritis, unspecified site: Secondary | ICD-10-CM | POA: Diagnosis not present

## 2023-09-16 DIAGNOSIS — Z6841 Body Mass Index (BMI) 40.0 and over, adult: Secondary | ICD-10-CM | POA: Diagnosis not present

## 2023-09-16 DIAGNOSIS — J042 Acute laryngotracheitis: Secondary | ICD-10-CM | POA: Diagnosis not present

## 2023-09-16 DIAGNOSIS — E210 Primary hyperparathyroidism: Secondary | ICD-10-CM | POA: Diagnosis not present

## 2023-09-16 DIAGNOSIS — I10 Essential (primary) hypertension: Secondary | ICD-10-CM | POA: Diagnosis not present

## 2023-09-28 ENCOUNTER — Ambulatory Visit: Payer: Medicare Other | Attending: Cardiovascular Disease | Admitting: *Deleted

## 2023-09-28 ENCOUNTER — Telehealth: Payer: Self-pay | Admitting: *Deleted

## 2023-09-28 DIAGNOSIS — Z952 Presence of prosthetic heart valve: Secondary | ICD-10-CM | POA: Diagnosis not present

## 2023-09-28 DIAGNOSIS — Z5181 Encounter for therapeutic drug level monitoring: Secondary | ICD-10-CM

## 2023-09-28 LAB — POCT INR: INR: 8 — AB (ref 2.0–3.0)

## 2023-09-28 LAB — PROTIME-INR: INR: 9.33 — AB (ref 0.80–1.20)

## 2023-09-28 NOTE — Telephone Encounter (Signed)
 [name] at [facility] called with critical results   PT-97 INR 9.33

## 2023-09-28 NOTE — Telephone Encounter (Signed)
 Dahlia Client from [facility] is calling to give a critical lab report on patient

## 2023-09-28 NOTE — Patient Instructions (Addendum)
 Had steroid shot, prednisone  dose pack and amoxicillin for cough and congestion.  Has finished now. POC INR >8.0  Sent to [facility] for STAT INR.  INR 9.33   HOLD warfarin x 5 days (1/8 - 1/12)   Restart warfarin 1 tablet daily except 1/2 tablet on Tuesdays and Saturdays on Monday 10/03/23.  Recheck INR on 10/05/23.  Pt verbalized understanding. She denies any S/S of bleeding. Fall and bleeding precautions discussed with pt and she verbalized understanding.  Pt to go to ED if any symptoms develop.

## 2023-09-28 NOTE — Telephone Encounter (Signed)
 Lab noted and patient notified.  See coumadin note.

## 2023-10-03 ENCOUNTER — Other Ambulatory Visit: Payer: Self-pay | Admitting: Cardiovascular Disease

## 2023-10-03 DIAGNOSIS — Z952 Presence of prosthetic heart valve: Secondary | ICD-10-CM

## 2023-10-05 ENCOUNTER — Ambulatory Visit: Payer: Medicare Other | Attending: Cardiovascular Disease | Admitting: *Deleted

## 2023-10-05 DIAGNOSIS — Z952 Presence of prosthetic heart valve: Secondary | ICD-10-CM | POA: Insufficient documentation

## 2023-10-05 DIAGNOSIS — Z5181 Encounter for therapeutic drug level monitoring: Secondary | ICD-10-CM | POA: Diagnosis not present

## 2023-10-05 LAB — POCT INR: INR: 1.7 — AB (ref 2.0–3.0)

## 2023-10-05 NOTE — Patient Instructions (Signed)
 Take warfarin 1 1/2 tablets tonight then continue 1 tablet daily except 1/2 tablet on Tuesdays and Saturdays  Recheck in 3 wks

## 2023-10-11 DIAGNOSIS — H40013 Open angle with borderline findings, low risk, bilateral: Secondary | ICD-10-CM | POA: Diagnosis not present

## 2023-10-26 ENCOUNTER — Ambulatory Visit: Payer: Medicare Other | Attending: Cardiovascular Disease | Admitting: *Deleted

## 2023-10-26 DIAGNOSIS — Z952 Presence of prosthetic heart valve: Secondary | ICD-10-CM

## 2023-10-26 DIAGNOSIS — Z5181 Encounter for therapeutic drug level monitoring: Secondary | ICD-10-CM

## 2023-10-26 LAB — POCT INR: POC INR: 3.7

## 2023-10-26 NOTE — Patient Instructions (Signed)
Description   Hold warfarin today and then START taking warfarin 1 tablet daily except 1/2 tablet on Tuesdays, Thursdays and Saturdays. Recheck INR in 2 weeks.

## 2023-11-01 DIAGNOSIS — M461 Sacroiliitis, not elsewhere classified: Secondary | ICD-10-CM | POA: Diagnosis not present

## 2023-11-01 DIAGNOSIS — M5451 Vertebrogenic low back pain: Secondary | ICD-10-CM | POA: Diagnosis not present

## 2023-11-01 DIAGNOSIS — G894 Chronic pain syndrome: Secondary | ICD-10-CM | POA: Diagnosis not present

## 2023-11-01 DIAGNOSIS — Z79891 Long term (current) use of opiate analgesic: Secondary | ICD-10-CM | POA: Diagnosis not present

## 2023-11-03 ENCOUNTER — Other Ambulatory Visit: Payer: Self-pay | Admitting: Cardiovascular Disease

## 2023-11-03 MED ORDER — ROSUVASTATIN CALCIUM 10 MG PO TABS: 10.0000 mg | ORAL_TABLET | Freq: Every day | ORAL | 0 refills | Status: DC

## 2023-11-03 NOTE — Telephone Encounter (Signed)
Sent prescription  per patient request . Attempted to call x2  no answer

## 2023-11-09 ENCOUNTER — Ambulatory Visit: Payer: Medicare Other

## 2023-11-16 ENCOUNTER — Ambulatory Visit: Payer: BLUE CROSS/BLUE SHIELD | Admitting: Cardiovascular Disease

## 2023-11-16 DIAGNOSIS — F5101 Primary insomnia: Secondary | ICD-10-CM | POA: Diagnosis not present

## 2023-11-16 DIAGNOSIS — Z6841 Body Mass Index (BMI) 40.0 and over, adult: Secondary | ICD-10-CM | POA: Diagnosis not present

## 2023-11-17 ENCOUNTER — Ambulatory Visit: Payer: Medicare Other | Attending: Cardiovascular Disease | Admitting: *Deleted

## 2023-11-17 DIAGNOSIS — Z952 Presence of prosthetic heart valve: Secondary | ICD-10-CM | POA: Diagnosis not present

## 2023-11-17 DIAGNOSIS — Z5181 Encounter for therapeutic drug level monitoring: Secondary | ICD-10-CM

## 2023-11-17 LAB — POCT INR: INR: 3.4 — AB (ref 2.0–3.0)

## 2023-11-17 NOTE — Patient Instructions (Signed)
 Hold warfarin today and then decrease dose to 1/2 tablet daily except 1 tablet on Mondays, Wednesdays and Fridays. Recheck INR in 3 weeks.

## 2023-11-18 DIAGNOSIS — N189 Chronic kidney disease, unspecified: Secondary | ICD-10-CM | POA: Diagnosis not present

## 2023-11-18 DIAGNOSIS — R809 Proteinuria, unspecified: Secondary | ICD-10-CM | POA: Diagnosis not present

## 2023-11-18 DIAGNOSIS — D631 Anemia in chronic kidney disease: Secondary | ICD-10-CM | POA: Diagnosis not present

## 2023-11-24 ENCOUNTER — Other Ambulatory Visit: Payer: Self-pay | Admitting: Cardiovascular Disease

## 2023-11-25 DIAGNOSIS — Z952 Presence of prosthetic heart valve: Secondary | ICD-10-CM | POA: Diagnosis not present

## 2023-11-25 DIAGNOSIS — N1832 Chronic kidney disease, stage 3b: Secondary | ICD-10-CM | POA: Diagnosis not present

## 2023-11-25 DIAGNOSIS — E1122 Type 2 diabetes mellitus with diabetic chronic kidney disease: Secondary | ICD-10-CM | POA: Diagnosis not present

## 2023-11-25 DIAGNOSIS — E1129 Type 2 diabetes mellitus with other diabetic kidney complication: Secondary | ICD-10-CM | POA: Diagnosis not present

## 2023-12-12 ENCOUNTER — Ambulatory Visit: Attending: Cardiovascular Disease | Admitting: *Deleted

## 2023-12-12 DIAGNOSIS — Z952 Presence of prosthetic heart valve: Secondary | ICD-10-CM | POA: Insufficient documentation

## 2023-12-12 DIAGNOSIS — Z5181 Encounter for therapeutic drug level monitoring: Secondary | ICD-10-CM | POA: Insufficient documentation

## 2023-12-12 LAB — POCT INR: INR: 5.6 — AB (ref 2.0–3.0)

## 2023-12-12 NOTE — Patient Instructions (Signed)
 Hold warfarin today and tomorrow the decrease dose to 1/2 tablet daily except 1 tablet on Mondays and Fridays. Recheck INR in 2 weeks.

## 2023-12-16 ENCOUNTER — Ambulatory Visit: Payer: BLUE CROSS/BLUE SHIELD | Attending: Cardiovascular Disease | Admitting: Cardiovascular Disease

## 2023-12-16 ENCOUNTER — Encounter: Payer: Self-pay | Admitting: Cardiovascular Disease

## 2023-12-16 VITALS — BP 136/68 | HR 79 | Ht 62.0 in | Wt 254.6 lb

## 2023-12-16 DIAGNOSIS — E1169 Type 2 diabetes mellitus with other specified complication: Secondary | ICD-10-CM

## 2023-12-16 DIAGNOSIS — Z9889 Other specified postprocedural states: Secondary | ICD-10-CM

## 2023-12-16 DIAGNOSIS — Z8679 Personal history of other diseases of the circulatory system: Secondary | ICD-10-CM | POA: Diagnosis not present

## 2023-12-16 DIAGNOSIS — E782 Mixed hyperlipidemia: Secondary | ICD-10-CM | POA: Insufficient documentation

## 2023-12-16 DIAGNOSIS — Z7901 Long term (current) use of anticoagulants: Secondary | ICD-10-CM | POA: Diagnosis not present

## 2023-12-16 DIAGNOSIS — Z952 Presence of prosthetic heart valve: Secondary | ICD-10-CM | POA: Diagnosis not present

## 2023-12-16 DIAGNOSIS — E669 Obesity, unspecified: Secondary | ICD-10-CM

## 2023-12-16 DIAGNOSIS — I10 Essential (primary) hypertension: Secondary | ICD-10-CM | POA: Insufficient documentation

## 2023-12-16 MED ORDER — EZETIMIBE 10 MG PO TABS: 10.0000 mg | ORAL_TABLET | Freq: Every day | ORAL | 3 refills | Status: DC

## 2023-12-16 NOTE — Patient Instructions (Signed)
 Medication Instructions:  Ezetimibe 10mg  daily *If you need a refill on your cardiac medications before your next appointment, please call your pharmacy*  Lab Work: Lipid Panel- 3 months (can get done at [facility] in [place]) If you have labs (blood work) drawn today and your tests are completely normal, you will receive your results only by: MyChart Message (if you have MyChart) OR A paper copy in the mail If you have any lab test that is abnormal or we need to change your treatment, we will call you to review the results.  Follow-Up: At [facility], you and your health needs are our priority.  As part of our continuing mission to provide you with exceptional heart care, our providers are all part of one team.  This team includes your primary Cardiologist (physician) and Advanced Practice Providers or APPs (Physician Assistants and Nurse Practitioners) who all work together to provide you with the care you need, when you need it.  Your next appointment:   1 year(s)  Provider:   Thurmon Fair, MD     We recommend signing up for the patient portal called "MyChart".  Sign up information is provided on this After Visit Summary.  MyChart is used to connect with patients for Virtual Visits (Telemedicine).  Patients are able to view lab/test results, encounter notes, upcoming appointments, etc.  Non-urgent messages can be sent to your provider as well.   To learn more about what you can do with MyChart, go to [email or web address].        1st Floor: - Lobby - Registration  - Pharmacy  - Lab - Cafe  2nd Floor: - PV Lab - Diagnostic Testing (echo, CT, nuclear med)  3rd Floor: - Vacant  4th Floor: - TCTS (cardiothoracic surgery) - AFib Clinic - Structural Heart Clinic - Vascular Surgery  - Vascular Ultrasound  5th Floor: - HeartCare Cardiology (general and EP) - Clinical Pharmacy for coumadin, hypertension, lipid, weight-loss medications, and med management  appointments    Valet parking services will be available as well.

## 2023-12-16 NOTE — Progress Notes (Signed)
 Cardiology Office Note    Date:  12/24/2023   ID:  [name], DOB [birth year], MRN [id number]  PCP:  Elfredia Nevins, MD  Cardiologist:   Thurmon Fair, MD   Chief complaint: Follow-up mechanical aortic valve replacement  History of Present Illness:  [name] is a 73 y.o. female here for follow-up of mechanical aortic valve replacement and ascending aortic aneurysm repair [name] procedure, 2011 , 21 mm valve) performed for bicuspid aortic valve with critical aortic stenosis. Additional problems include hypertension, diabetes mellitus, mixed hyperlipidemia in the setting of morbid obesity.  She is generally doing well and has no cardiovascular complaints since her last appointment.  Blood pressure is borderline high when initially evaluated, but was normal when we recheck.  Glycemic control is good with a hemoglobin A1c of 6.3%.  Her cholesterol slightly high with an LDL of 108.  Has a history of gout, lactose intolerance and alpha-gal sensitization.  Her creatinine has improved since her last appointment and is back down to 1.43.  She sees Dr. Kristian Covey in [place] in the nephrology clinic.  Her most recent echocardiogram performed August 04, 2023 shows normal left ventricular systolic function and normal/unchanged gradients across the aortic valve prosthesis (mean gradient 12 mmHg) and intact ascending aorta repair (28 mm Hemashield graft).  INR monitoring is in [place].  She has not had any bleeding problems, although her most recent INR was quite elevated at 5.6.  Past Medical History:  Diagnosis Date   AI (aortic insufficiency)    Arthritis    AS (aortic stenosis) 09/18/2012   2D Echo EF 55%-60%   Chronic kidney disease    renal insuffcentily   Coronary artery disease    Cough    Diabetes mellitus (HCC)    Gout    right foot   Hyperlipidemia    Hypertension    dr Moreen Fowler   Obese    PONV (postoperative nausea and vomiting)    with early  surgeries , none recent   Renal insufficiency    Tricuspid regurgitation     Past Surgical History:  Procedure Laterality Date   AORTIC VALVE REPLACEMENT  04/01/2010   21-mm Sr Jude Regent ;mechanical valve    BACK SURGERY     broken arm     wire in lt elbow   CARDIAC CATHETERIZATION  02/09/2010   FRACTURE SURGERY     GANGLION CYST EXCISION Left 01/08/2016   Procedure: LEFT WRIST EXCISION OF VOLAR CYST, EXCISION OF MUCOID TUMOR;  Surgeon: Cindee Salt, MD;  Location: [facility];  Service: Orthopedics;  Laterality: Left;   LUMBAR LAMINECTOMY/DECOMPRESSION MICRODISCECTOMY N/A 02/28/2013   Procedure: LUMBAR LAMINECTOMY/DECOMPRESSION MICRODISCECTOMY 2 LEVELS;  Surgeon: Cristi Loron, MD;  Location: [facility];  Service: Neurosurgery;  Laterality: N/A;  Lumbar three-four and Lumbar four-five laminectomies   MASS EXCISION Left 01/08/2016   Procedure: EXCISION MUCOID TUMOR LEFT INDEX FINGER DISTAL INTERPHALANGEAL JOINT;  Surgeon: Cindee Salt, MD;  Location: [facility];  Service: Orthopedics;  Laterality: Left;   replacement  of ascending aortic aneurysm  04/01/2010   28-mm supracoronary Hemashield tube graft using deep hypothermic circulatory arrest    Current Medications: Outpatient Medications Prior to Visit  Medication Sig Dispense Refill   allopurinol (ZYLOPRIM) 100 MG tablet Take 100 mg by mouth daily.     b complex vitamins tablet Take 1 tablet by mouth daily.     Biotin 40981 MCG TABS Take 2,000 mg by mouth daily at  3 pm.     Blood Glucose Monitoring Suppl (ONE TOUCH ULTRA 2) w/Device KIT daily.     co-enzyme Q-10 30 MG capsule Take 30 mg by mouth daily.     gabapentin (NEURONTIN) 300 MG capsule Take 300 mg by mouth daily.     glimepiride (AMARYL) 2 MG tablet Take 2 mg by mouth daily before breakfast.     guaiFENesin (MUCINEX PO) Take 1 tablet by mouth in the morning and at bedtime.     HYDROcodone-acetaminophen (NORCO/VICODIN) 5-325 MG tablet Take 1  tablet by mouth every 8 (eight) hours as needed.     lisinopril (ZESTRIL) 5 MG tablet Take 1 tablet (5 mg total) by mouth daily. 90 tablet 3   Misc Natural Products (LUTEIN 20 PO) Take 20 mg by mouth daily at 6 (six) AM.     Multiple Vitamin (MULTIVITAMIN WITH MINERALS) TABS Take 1 tablet by mouth daily.     Omega-3 Fatty Acids (FISH OIL) 1000 MG CAPS Take 1,000 mg by mouth daily.     ONETOUCH ULTRA test strip daily in the afternoon.     rosuvastatin (CRESTOR) 10 MG tablet Take 1 tablet (10 mg total) by mouth daily. 30 tablet 0   warfarin (COUMADIN) 5 MG tablet TAKE AS INSTRUCTED BY YOUR PRESCRIBER 90 tablet 3   zolpidem (AMBIEN) 10 MG tablet Take 10 mg by mouth at bedtime.      Lancets (ONETOUCH DELICA PLUS LANCET33G) MISC Apply topically daily. (Patient not taking: Reported on 08/03/2022)     metoprolol tartrate (LOPRESSOR) 50 MG tablet TAKE 1/2 TABLET TWICE A DAY 90 tablet 3   multivitamin-lutein (OCUVITE-LUTEIN) CAPS Take 1 capsule by mouth daily.     topiramate (TOPAMAX) 25 MG tablet Take 25 mg by mouth 2 (two) times daily.     No facility-administered medications prior to visit.     Allergies:   Morphine and codeine   Family History:  The patient's family history includes Breast cancer (age of onset: 57) in her sister; CAD in an other family member; Cancer - Lung in her maternal grandfather; Cancer - Prostate in her father; Diabetes in her mother and another family member; Hypertension in an other family member.   ROS:   Please see the history of present illness.    All other systems are reviewed and are negative  PHYSICAL EXAM:   VS:  BP 136/68   Pulse 79   Ht 5\' 2"  (1.575 m)   Wt 254 lb 9.6 oz (115.5 kg)   SpO2 95%   BMI 46.57 kg/m      General: Alert, oriented x3, no distress, morbidly obese Head: no evidence of trauma, PERRL, EOMI, no exophtalmos or lid lag, no myxedema, no xanthelasma; normal ears, nose and oropharynx Neck: normal jugular venous pulsations and no  hepatojugular reflux; brisk carotid pulses without delay and no carotid bruits Chest: clear to auscultation, no signs of consolidation by percussion or palpation, normal fremitus, symmetrical and full respiratory excursions Cardiovascular: normal position and quality of the apical impulse, regular rhythm, normal crisp prosthetic valve clicks, 1-2/6 aortic ejection murmur, no diastolic murmurs, rubs or gallops Abdomen: no tenderness or distention, no masses by palpation, no abnormal pulsatility or arterial bruits, normal bowel sounds, no hepatosplenomegaly Extremities: no clubbing, cyanosis or edema; 2+ radial, ulnar and brachial pulses bilaterally; 2+ right femoral, posterior tibial and dorsalis pedis pulses; 2+ left femoral, posterior tibial and dorsalis pedis pulses; no subclavian or femoral bruits Neurological: grossly nonfocal Psych: Normal mood  and affect     Wt Readings from Last 3 Encounters:  12/16/23 254 lb 9.6 oz (115.5 kg)  08/03/22 251 lb 6.4 oz (114 kg)  12/26/20 262 lb (118.8 kg)      Studies/Labs Reviewed:   ECHO 08/04/2023    1. Left ventricular ejection fraction, by estimation, is 60 to 65%. The  left ventricle has normal function. The left ventricle has no regional  wall motion abnormalities. There is moderate concentric left ventricular  hypertrophy. Left ventricular  diastolic parameters are consistent with Grade I diastolic dysfunction  (impaired relaxation).   2. Right ventricular systolic function is normal. The right ventricular  size is normal. There is normal pulmonary artery systolic pressure. The  estimated right ventricular systolic pressure is 34.0 mmHg.   3. The mitral valve is degenerative. Trivial mitral valve regurgitation.  Moderate mitral annular calcification.   4. The aortic valve has been repaired/replaced. Aortic valve  regurgitation is not visualized. There is a 21 mm St. Jude Regent valve  present in the aortic position. Procedure Date:  04/01/2020. Aortic valve  mean gradient measures 12.0 mmHg.   5. There is a 28 mm Hemashield ascending aortic graft.   6. The inferior vena cava is dilated in size with >50% respiratory  variability, suggesting right atrial pressure of 8 mmHg.   Comparison(s): Prior images reviewed side by side. LVEF normal range at  60-65%. St. Jude AVR with normal function and stable mean AV gradient of  12 mmHg.    EKG:    EKG Interpretation Date/Time:  Friday December 16 2023 12:00:32 EDT Ventricular Rate:  79 PR Interval:  136 QRS Duration:  90 QT Interval:  360 QTC Calculation: 412 R Axis:   -25  Text Interpretation: Normal sinus rhythm Left ventricular hypertrophy with repolarization abnormality ( R in aVL , Cornell product ) When compared with ECG of 06-Dec-2015 23:17, No significant change since last tracing Confirmed by Ismelda Weatherman [phone]) on 12/16/2023 12:22:44 PM         Recent Labs: 05/28/2022 Hemoglobin A1c 6.0% Hemoglobin 12.5, creatinine 1.5, potassium 4.7, ALT 23, TSH 1.070    Lipid Panel February 2017 total cholesterol 269, triglycerides 223, HDL 57, LDL 167  February 23, 2019 total cholesterol 207, HDL 57, LDL 114, triglycerides 182 June 2021 total cholesterol 207, HDL 57, LDL 109, triglycerides 224  05/28/2022 Cholesterol 205, HDL 60, LDL 115, triglycerides 175  06/08/2023 Cholesterol 200, HDL 56, LDL 108, triglycerides 208 Hemoglobin A1c 6.3%  06/08/2023 Creatinine 1.43, potassium 4.3, ALT 24, TSH 1.070  12/12/2023 Most recent INR 5.6  ASSESSMENT:    1. H/O mechanical aortic valve replacement   2. Morbid obesity (HCC)   3. Mixed hyperlipidemia   4. Type 2 diabetes mellitus with obesity (HCC)   5. Essential hypertension   6. Warfarin anticoagulation   7. History of aortic aneurysm repair       PLAN:  In order of problems listed above:  S/P AVR: Stable valve parameters on echocardiography.  Asymptomatic compliant with warfarin anticoagulation and  monitoring.  Aware of the need for endocarditis prophylaxis with dental procedures. Warfarin: Monitored in the [facility] Obesity: Unfortunately remains morbidly obese HLP: LDL not quite at goal less than 100, but close enough to her I do not think we need to switch to PCSK9 inhibitor.  Will add Zetia 10 mg daily and recheck labs in 3 months. DM: Good glycemic control. HTN: Show a little higher than usual when she first checked  in, but acceptable when we rechecked. HyperCa: History of hypercalcemia, hyperparathyroidism, avoid thiazide diuretics. Gout: No recent gout exacerbations.  Another good reason to avoid thiazides and diuretics in general. History of bicuspid aortic valve with ascending aortic aneurysm requiring Bentall replacement.    Medication Adjustments/Labs and Tests Ordered: Current medicines are reviewed at length with the patient today.  Concerns regarding medicines are outlined above.  Medication changes, Labs and Tests ordered today are listed in the Patient Instructions below. Patient Instructions  Medication Instructions:  Ezetimibe 10mg  daily *If you need a refill on your cardiac medications before your next appointment, please call your pharmacy*  Lab Work: Lipid Panel- 3 months (can get done at [facility] in [place]) If you have labs (blood work) drawn today and your tests are completely normal, you will receive your results only by: MyChart Message (if you have MyChart) OR A paper copy in the mail If you have any lab test that is abnormal or we need to change your treatment, we will call you to review the results.  Follow-Up: At [facility], you and your health needs are our priority.  As part of our continuing mission to provide you with exceptional heart care, our providers are all part of one team.  This team includes your primary Cardiologist (physician) and Advanced Practice Providers or APPs (Physician Assistants and Nurse  Practitioners) who all work together to provide you with the care you need, when you need it.  Your next appointment:   1 year(s)  Provider:   Thurmon Fair, MD     We recommend signing up for the patient portal called "MyChart".  Sign up information is provided on this After Visit Summary.  MyChart is used to connect with patients for Virtual Visits (Telemedicine).  Patients are able to view lab/test results, encounter notes, upcoming appointments, etc.  Non-urgent messages can be sent to your provider as well.   To learn more about what you can do with MyChart, go to [email or web address].        1st Floor: - Lobby - Registration  - Pharmacy  - Lab - Cafe  2nd Floor: - PV Lab - Diagnostic Testing (echo, CT, nuclear med)  3rd Floor: - Vacant  4th Floor: - TCTS (cardiothoracic surgery) - AFib Clinic - Structural Heart Clinic - Vascular Surgery  - Vascular Ultrasound  5th Floor: - HeartCare Cardiology (general and EP) - Clinical Pharmacy for coumadin, hypertension, lipid, weight-loss medications, and med management appointments    Valet parking services will be available as well.      Signed, Thurmon Fair, MD  12/24/2023 5:46 PM    [facility] [address] [place], [place], [place]  [address] Phone: [phone]; Fax: [phone]

## 2023-12-24 ENCOUNTER — Encounter: Payer: Self-pay | Admitting: Cardiovascular Disease

## 2023-12-26 ENCOUNTER — Ambulatory Visit: Attending: Cardiovascular Disease | Admitting: *Deleted

## 2023-12-26 DIAGNOSIS — Z5181 Encounter for therapeutic drug level monitoring: Secondary | ICD-10-CM | POA: Insufficient documentation

## 2023-12-26 DIAGNOSIS — Z952 Presence of prosthetic heart valve: Secondary | ICD-10-CM | POA: Insufficient documentation

## 2023-12-26 LAB — POCT INR: INR: 2.9 (ref 2.0–3.0)

## 2023-12-26 NOTE — Patient Instructions (Signed)
 Continue warfarin 1/2 tablet daily except 1 tablet on Mondays and Fridays.  Recheck INR in 3 weeks.

## 2024-01-10 DIAGNOSIS — M461 Sacroiliitis, not elsewhere classified: Secondary | ICD-10-CM | POA: Diagnosis not present

## 2024-01-10 DIAGNOSIS — G894 Chronic pain syndrome: Secondary | ICD-10-CM | POA: Diagnosis not present

## 2024-01-10 DIAGNOSIS — M5451 Vertebrogenic low back pain: Secondary | ICD-10-CM | POA: Diagnosis not present

## 2024-01-10 DIAGNOSIS — Z79891 Long term (current) use of opiate analgesic: Secondary | ICD-10-CM | POA: Diagnosis not present

## 2024-01-16 ENCOUNTER — Ambulatory Visit: Attending: Cardiovascular Disease | Admitting: *Deleted

## 2024-01-16 DIAGNOSIS — Z952 Presence of prosthetic heart valve: Secondary | ICD-10-CM | POA: Insufficient documentation

## 2024-01-16 DIAGNOSIS — Z5181 Encounter for therapeutic drug level monitoring: Secondary | ICD-10-CM | POA: Diagnosis not present

## 2024-01-16 LAB — POCT INR: INR: 3.4 — AB (ref 2.0–3.0)

## 2024-01-16 NOTE — Patient Instructions (Signed)
 Decrease warfarin to 1/2 tablet daily except 1 tablet on Fridays.  Recheck INR in 3 weeks.

## 2024-01-26 ENCOUNTER — Ambulatory Visit (HOSPITAL_COMMUNITY)
Admission: RE | Admit: 2024-01-26 | Discharge: 2024-01-26 | Disposition: A | Discharge status: Home / Self Care | Source: Ambulatory Visit | Attending: Internal Medicine | Admitting: Internal Medicine

## 2024-01-26 ENCOUNTER — Telehealth: Payer: Self-pay | Admitting: Cardiovascular Disease

## 2024-01-26 ENCOUNTER — Other Ambulatory Visit (HOSPITAL_COMMUNITY): Payer: Self-pay | Admitting: Internal Medicine

## 2024-01-26 DIAGNOSIS — M67912 Unspecified disorder of synovium and tendon, left shoulder: Secondary | ICD-10-CM | POA: Diagnosis not present

## 2024-01-26 DIAGNOSIS — M179 Osteoarthritis of knee, unspecified: Secondary | ICD-10-CM | POA: Diagnosis not present

## 2024-01-26 DIAGNOSIS — M25512 Pain in left shoulder: Secondary | ICD-10-CM

## 2024-01-26 DIAGNOSIS — E1122 Type 2 diabetes mellitus with diabetic chronic kidney disease: Secondary | ICD-10-CM | POA: Diagnosis not present

## 2024-01-26 DIAGNOSIS — I10 Essential (primary) hypertension: Secondary | ICD-10-CM | POA: Diagnosis not present

## 2024-01-26 DIAGNOSIS — Z952 Presence of prosthetic heart valve: Secondary | ICD-10-CM | POA: Diagnosis not present

## 2024-01-26 DIAGNOSIS — R269 Unspecified abnormalities of gait and mobility: Secondary | ICD-10-CM | POA: Diagnosis not present

## 2024-01-26 DIAGNOSIS — Z6841 Body Mass Index (BMI) 40.0 and over, adult: Secondary | ICD-10-CM | POA: Diagnosis not present

## 2024-01-26 DIAGNOSIS — E1165 Type 2 diabetes mellitus with hyperglycemia: Secondary | ICD-10-CM | POA: Diagnosis not present

## 2024-01-26 DIAGNOSIS — G629 Polyneuropathy, unspecified: Secondary | ICD-10-CM | POA: Diagnosis not present

## 2024-01-26 DIAGNOSIS — M1991 Primary osteoarthritis, unspecified site: Secondary | ICD-10-CM | POA: Diagnosis not present

## 2024-01-26 DIAGNOSIS — I129 Hypertensive chronic kidney disease with stage 1 through stage 4 chronic kidney disease, or unspecified chronic kidney disease: Secondary | ICD-10-CM | POA: Diagnosis not present

## 2024-01-26 DIAGNOSIS — N1831 Chronic kidney disease, stage 3a: Secondary | ICD-10-CM | POA: Diagnosis not present

## 2024-01-26 NOTE — Telephone Encounter (Signed)
 Spoke with patient and she states she saw her PCP today because of shoulder pain. He offered her a steroid shot but she declined and wanted to make sure it will not interfere with her cardiac medications and her INR.

## 2024-01-26 NOTE — Telephone Encounter (Signed)
 Called and spoke to pt. She stated that she was concerned about taking a steroid shot while being on warfarin. Pt would be getting the shot in her shoulder. Informed pt that steroid injections typically do not effect the INR. However, oral steroid dose packs do. Offered patient to make her next INR appointment  after she has the steroid injection. Pt scheduled to come in on 5/19 to have INR checked.

## 2024-01-26 NOTE — Telephone Encounter (Signed)
 Patient calling in to see if its okay for her to take a steroid shot her dr wants to giver her. Patients has some concerns with a heart medication she already on. Please advise

## 2024-02-01 DIAGNOSIS — M25512 Pain in left shoulder: Secondary | ICD-10-CM | POA: Diagnosis not present

## 2024-02-01 DIAGNOSIS — M1991 Primary osteoarthritis, unspecified site: Secondary | ICD-10-CM | POA: Diagnosis not present

## 2024-02-06 ENCOUNTER — Ambulatory Visit: Attending: Cardiovascular Disease | Admitting: *Deleted

## 2024-02-06 DIAGNOSIS — Z5181 Encounter for therapeutic drug level monitoring: Secondary | ICD-10-CM | POA: Diagnosis not present

## 2024-02-06 DIAGNOSIS — Z952 Presence of prosthetic heart valve: Secondary | ICD-10-CM | POA: Insufficient documentation

## 2024-02-06 LAB — POCT INR: INR: 2.1 (ref 2.0–3.0)

## 2024-02-06 NOTE — Patient Instructions (Signed)
 Continue warfarin 1/2 tablet daily except 1 tablet on Fridays.  Recheck INR in 4 weeks.

## 2024-03-06 DIAGNOSIS — M5451 Vertebrogenic low back pain: Secondary | ICD-10-CM | POA: Diagnosis not present

## 2024-03-06 DIAGNOSIS — Z79891 Long term (current) use of opiate analgesic: Secondary | ICD-10-CM | POA: Diagnosis not present

## 2024-03-06 DIAGNOSIS — M461 Sacroiliitis, not elsewhere classified: Secondary | ICD-10-CM | POA: Diagnosis not present

## 2024-03-06 DIAGNOSIS — G894 Chronic pain syndrome: Secondary | ICD-10-CM | POA: Diagnosis not present

## 2024-03-09 DIAGNOSIS — M47812 Spondylosis without myelopathy or radiculopathy, cervical region: Secondary | ICD-10-CM | POA: Diagnosis not present

## 2024-03-09 DIAGNOSIS — M4316 Spondylolisthesis, lumbar region: Secondary | ICD-10-CM | POA: Diagnosis not present

## 2024-03-09 DIAGNOSIS — M542 Cervicalgia: Secondary | ICD-10-CM | POA: Diagnosis not present

## 2024-03-12 ENCOUNTER — Ambulatory Visit: Attending: Cardiovascular Disease | Admitting: *Deleted

## 2024-03-12 DIAGNOSIS — Z5181 Encounter for therapeutic drug level monitoring: Secondary | ICD-10-CM | POA: Diagnosis not present

## 2024-03-12 DIAGNOSIS — Z952 Presence of prosthetic heart valve: Secondary | ICD-10-CM | POA: Insufficient documentation

## 2024-03-12 LAB — POCT INR: INR: 1.4 — AB (ref 2.0–3.0)

## 2024-03-12 NOTE — Patient Instructions (Signed)
 Take warfarin 1 tablet tonight then increase dose to 1/2 tablet daily except 1 tablet on Tuesdays and Fridays.  Recheck INR in 2 weeks.

## 2024-03-21 DIAGNOSIS — M4316 Spondylolisthesis, lumbar region: Secondary | ICD-10-CM | POA: Diagnosis not present

## 2024-03-21 DIAGNOSIS — M48061 Spinal stenosis, lumbar region without neurogenic claudication: Secondary | ICD-10-CM | POA: Diagnosis not present

## 2024-03-21 DIAGNOSIS — M5126 Other intervertebral disc displacement, lumbar region: Secondary | ICD-10-CM | POA: Diagnosis not present

## 2024-03-21 DIAGNOSIS — I639 Cerebral infarction, unspecified: Secondary | ICD-10-CM | POA: Diagnosis not present

## 2024-03-21 DIAGNOSIS — M4802 Spinal stenosis, cervical region: Secondary | ICD-10-CM | POA: Diagnosis not present

## 2024-03-21 DIAGNOSIS — M47812 Spondylosis without myelopathy or radiculopathy, cervical region: Secondary | ICD-10-CM | POA: Diagnosis not present

## 2024-03-22 DIAGNOSIS — D631 Anemia in chronic kidney disease: Secondary | ICD-10-CM | POA: Diagnosis not present

## 2024-03-22 DIAGNOSIS — N189 Chronic kidney disease, unspecified: Secondary | ICD-10-CM | POA: Diagnosis not present

## 2024-03-22 DIAGNOSIS — R809 Proteinuria, unspecified: Secondary | ICD-10-CM | POA: Diagnosis not present

## 2024-03-22 DIAGNOSIS — E211 Secondary hyperparathyroidism, not elsewhere classified: Secondary | ICD-10-CM | POA: Diagnosis not present

## 2024-03-26 ENCOUNTER — Ambulatory Visit: Attending: Cardiovascular Disease | Admitting: *Deleted

## 2024-03-26 DIAGNOSIS — Z5181 Encounter for therapeutic drug level monitoring: Secondary | ICD-10-CM | POA: Diagnosis not present

## 2024-03-26 DIAGNOSIS — Z952 Presence of prosthetic heart valve: Secondary | ICD-10-CM | POA: Insufficient documentation

## 2024-03-26 LAB — POCT INR: INR: 1.7 — AB (ref 2.0–3.0)

## 2024-03-26 NOTE — Progress Notes (Signed)
Please see anticoagulation encounter.

## 2024-03-26 NOTE — Patient Instructions (Signed)
 Increase dose to 1/2 tablet daily except 1 tablet on Mondays, Wednesdays and Fridays.  Recheck INR in 2 weeks.

## 2024-03-30 ENCOUNTER — Other Ambulatory Visit (HOSPITAL_COMMUNITY): Payer: Self-pay | Admitting: Nephrology

## 2024-03-30 DIAGNOSIS — I129 Hypertensive chronic kidney disease with stage 1 through stage 4 chronic kidney disease, or unspecified chronic kidney disease: Secondary | ICD-10-CM

## 2024-03-30 DIAGNOSIS — E1122 Type 2 diabetes mellitus with diabetic chronic kidney disease: Secondary | ICD-10-CM

## 2024-03-30 DIAGNOSIS — R809 Proteinuria, unspecified: Secondary | ICD-10-CM | POA: Diagnosis not present

## 2024-03-30 DIAGNOSIS — E1129 Type 2 diabetes mellitus with other diabetic kidney complication: Secondary | ICD-10-CM | POA: Diagnosis not present

## 2024-03-30 DIAGNOSIS — N1832 Chronic kidney disease, stage 3b: Secondary | ICD-10-CM | POA: Diagnosis not present

## 2024-04-09 ENCOUNTER — Ambulatory Visit: Attending: Cardiovascular Disease | Admitting: *Deleted

## 2024-04-09 DIAGNOSIS — Z952 Presence of prosthetic heart valve: Secondary | ICD-10-CM | POA: Diagnosis not present

## 2024-04-09 DIAGNOSIS — Z5181 Encounter for therapeutic drug level monitoring: Secondary | ICD-10-CM | POA: Insufficient documentation

## 2024-04-09 LAB — POCT INR: INR: 1.5 — AB (ref 2.0–3.0)

## 2024-04-09 NOTE — Patient Instructions (Signed)
 Take warfarin 2 tablets tonight then increase dose to 1 tablet daily except 1/2 tablet on Sundays and Thursdays.  Recheck INR in 2 weeks.

## 2024-04-10 ENCOUNTER — Ambulatory Visit (HOSPITAL_COMMUNITY)
Admission: RE | Admit: 2024-04-10 | Discharge: 2024-04-10 | Disposition: A | Discharge status: Home / Self Care | Source: Ambulatory Visit | Attending: Nephrology | Admitting: Nephrology

## 2024-04-10 DIAGNOSIS — I129 Hypertensive chronic kidney disease with stage 1 through stage 4 chronic kidney disease, or unspecified chronic kidney disease: Secondary | ICD-10-CM | POA: Insufficient documentation

## 2024-04-10 DIAGNOSIS — R809 Proteinuria, unspecified: Secondary | ICD-10-CM | POA: Diagnosis not present

## 2024-04-10 DIAGNOSIS — E1122 Type 2 diabetes mellitus with diabetic chronic kidney disease: Secondary | ICD-10-CM | POA: Diagnosis not present

## 2024-04-10 DIAGNOSIS — N189 Chronic kidney disease, unspecified: Secondary | ICD-10-CM | POA: Diagnosis not present

## 2024-04-10 DIAGNOSIS — R93422 Abnormal radiologic findings on diagnostic imaging of left kidney: Secondary | ICD-10-CM | POA: Diagnosis not present

## 2024-04-16 DIAGNOSIS — H353112 Nonexudative age-related macular degeneration, right eye, intermediate dry stage: Secondary | ICD-10-CM | POA: Diagnosis not present

## 2024-04-23 ENCOUNTER — Ambulatory Visit: Attending: Cardiovascular Disease | Admitting: *Deleted

## 2024-04-23 DIAGNOSIS — Z5181 Encounter for therapeutic drug level monitoring: Secondary | ICD-10-CM | POA: Diagnosis not present

## 2024-04-23 DIAGNOSIS — Z952 Presence of prosthetic heart valve: Secondary | ICD-10-CM | POA: Insufficient documentation

## 2024-04-23 LAB — POCT INR: INR: 2.5 (ref 2.0–3.0)

## 2024-04-23 NOTE — Patient Instructions (Signed)
 Continue 1 tablet daily except 1/2 tablet on Sundays and Thursdays.  Recheck INR in 3 weeks.

## 2024-04-23 NOTE — Progress Notes (Signed)
 INR 2.5. Please see anticoagulation encounter

## 2024-05-01 DIAGNOSIS — Z79891 Long term (current) use of opiate analgesic: Secondary | ICD-10-CM | POA: Diagnosis not present

## 2024-05-01 DIAGNOSIS — M5451 Vertebrogenic low back pain: Secondary | ICD-10-CM | POA: Diagnosis not present

## 2024-05-01 DIAGNOSIS — G894 Chronic pain syndrome: Secondary | ICD-10-CM | POA: Diagnosis not present

## 2024-05-01 DIAGNOSIS — M461 Sacroiliitis, not elsewhere classified: Secondary | ICD-10-CM | POA: Diagnosis not present

## 2024-05-02 DIAGNOSIS — E119 Type 2 diabetes mellitus without complications: Secondary | ICD-10-CM | POA: Diagnosis not present

## 2024-05-02 DIAGNOSIS — H43823 Vitreomacular adhesion, bilateral: Secondary | ICD-10-CM | POA: Diagnosis not present

## 2024-05-02 DIAGNOSIS — H43812 Vitreous degeneration, left eye: Secondary | ICD-10-CM | POA: Diagnosis not present

## 2024-05-04 DIAGNOSIS — R809 Proteinuria, unspecified: Secondary | ICD-10-CM | POA: Diagnosis not present

## 2024-05-04 DIAGNOSIS — Z6841 Body Mass Index (BMI) 40.0 and over, adult: Secondary | ICD-10-CM | POA: Diagnosis not present

## 2024-05-04 DIAGNOSIS — E782 Mixed hyperlipidemia: Secondary | ICD-10-CM | POA: Diagnosis not present

## 2024-05-04 DIAGNOSIS — N189 Chronic kidney disease, unspecified: Secondary | ICD-10-CM | POA: Diagnosis not present

## 2024-05-04 DIAGNOSIS — F5101 Primary insomnia: Secondary | ICD-10-CM | POA: Diagnosis not present

## 2024-05-04 DIAGNOSIS — E211 Secondary hyperparathyroidism, not elsewhere classified: Secondary | ICD-10-CM | POA: Diagnosis not present

## 2024-05-04 DIAGNOSIS — D631 Anemia in chronic kidney disease: Secondary | ICD-10-CM | POA: Diagnosis not present

## 2024-05-05 LAB — LIPID PANEL
Chol/HDL Ratio: 5 ratio — ABNORMAL HIGH (ref 0.0–4.4)
Cholesterol, Total: 264 mg/dL — ABNORMAL HIGH (ref 100–199)
HDL: 53 mg/dL (ref >39)
LDL Chol Calc (NIH): 174 mg/dL — ABNORMAL HIGH (ref 0–99)
Triglycerides: 198 mg/dL — ABNORMAL HIGH (ref 0–149)
VLDL Cholesterol Cal: 37 mg/dL (ref 5–40)

## 2024-05-08 ENCOUNTER — Ambulatory Visit: Payer: Self-pay | Admitting: Cardiovascular Disease

## 2024-05-08 DIAGNOSIS — N2581 Secondary hyperparathyroidism of renal origin: Secondary | ICD-10-CM | POA: Diagnosis not present

## 2024-05-08 DIAGNOSIS — N179 Acute kidney failure, unspecified: Secondary | ICD-10-CM | POA: Diagnosis not present

## 2024-05-08 DIAGNOSIS — N1832 Chronic kidney disease, stage 3b: Secondary | ICD-10-CM | POA: Diagnosis not present

## 2024-05-08 DIAGNOSIS — E875 Hyperkalemia: Secondary | ICD-10-CM | POA: Diagnosis not present

## 2024-05-09 MED ORDER — EZETIMIBE 10 MG PO TABS: 10.0000 mg | ORAL_TABLET | Freq: Every day | ORAL | 3 refills | Status: AC

## 2024-05-09 MED ORDER — ROSUVASTATIN CALCIUM 10 MG PO TABS: 10.0000 mg | ORAL_TABLET | Freq: Every day | ORAL | 3 refills | Status: AC

## 2024-05-12 DIAGNOSIS — Z23 Encounter for immunization: Secondary | ICD-10-CM | POA: Diagnosis not present

## 2024-05-14 ENCOUNTER — Ambulatory Visit: Attending: Cardiovascular Disease | Admitting: *Deleted

## 2024-05-14 ENCOUNTER — Telehealth: Payer: Self-pay | Admitting: Cardiovascular Disease

## 2024-05-14 DIAGNOSIS — Z5181 Encounter for therapeutic drug level monitoring: Secondary | ICD-10-CM | POA: Diagnosis not present

## 2024-05-14 DIAGNOSIS — Z952 Presence of prosthetic heart valve: Secondary | ICD-10-CM | POA: Insufficient documentation

## 2024-05-14 DIAGNOSIS — E782 Mixed hyperlipidemia: Secondary | ICD-10-CM

## 2024-05-14 LAB — POCT INR: INR: 4 — AB (ref 2.0–3.0)

## 2024-05-14 NOTE — Telephone Encounter (Signed)
 Spoke with the patient and she reports that she was taking rosuvastatin  for a while and it wasn't working well, so PCP asked to change to pravastatin- so she switched. Then that was not working well enough, so you added the Ezetimibe . Now the pravastatin and Ezetimibe  was not working well enough; just sent in a prescription to resume taking Rosuvastatin  10 mg.  Instructed her to take the Pravastatin until the Rosuvastatin  comes. Informed that I will let Dr Francyne know this information and let her know of his recommendations. Probably will want repeat lab work in 2-3 months.  She verbalized understanding.

## 2024-05-14 NOTE — Telephone Encounter (Signed)
Pt returning nurses call regarding results. Please advise 

## 2024-05-14 NOTE — Patient Instructions (Signed)
 Hold warfarin tonight then resume 1 tablet daily except 1/2 tablet on Sundays and Thursdays.  Recheck INR in 3 weeks.

## 2024-05-14 NOTE — Progress Notes (Signed)
 INR 4.0; Please see anticoagulation encounter

## 2024-05-14 NOTE — Telephone Encounter (Signed)
 Let's repeat in 2-3 months and take it from there

## 2024-05-22 ENCOUNTER — Other Ambulatory Visit: Payer: Self-pay | Admitting: Physician Assistant

## 2024-05-22 DIAGNOSIS — Z1231 Encounter for screening mammogram for malignant neoplasm of breast: Secondary | ICD-10-CM

## 2024-06-04 ENCOUNTER — Ambulatory Visit: Attending: Cardiovascular Disease | Admitting: *Deleted

## 2024-06-04 DIAGNOSIS — Z5181 Encounter for therapeutic drug level monitoring: Secondary | ICD-10-CM | POA: Diagnosis not present

## 2024-06-04 DIAGNOSIS — Z952 Presence of prosthetic heart valve: Secondary | ICD-10-CM | POA: Insufficient documentation

## 2024-06-04 LAB — POCT INR: INR: 2.9 (ref 2.0–3.0)

## 2024-06-04 NOTE — Patient Instructions (Signed)
 Continue warfarin 1 tablet daily except 1/2 tablet on Sundays and Thursdays.  Recheck INR in 4 weeks.

## 2024-06-04 NOTE — Progress Notes (Signed)
 INR 2.9; Please see anticoagulation encounter

## 2024-06-26 DIAGNOSIS — M461 Sacroiliitis, not elsewhere classified: Secondary | ICD-10-CM | POA: Diagnosis not present

## 2024-06-26 DIAGNOSIS — M5451 Vertebrogenic low back pain: Secondary | ICD-10-CM | POA: Diagnosis not present

## 2024-06-26 DIAGNOSIS — G894 Chronic pain syndrome: Secondary | ICD-10-CM | POA: Diagnosis not present

## 2024-06-26 DIAGNOSIS — Z79891 Long term (current) use of opiate analgesic: Secondary | ICD-10-CM | POA: Diagnosis not present

## 2024-07-02 ENCOUNTER — Ambulatory Visit: Attending: Cardiovascular Disease | Admitting: *Deleted

## 2024-07-02 DIAGNOSIS — Z5181 Encounter for therapeutic drug level monitoring: Secondary | ICD-10-CM | POA: Insufficient documentation

## 2024-07-02 DIAGNOSIS — Z952 Presence of prosthetic heart valve: Secondary | ICD-10-CM | POA: Insufficient documentation

## 2024-07-02 LAB — POCT INR: INR: 2.8 (ref 2.0–3.0)

## 2024-07-02 NOTE — Progress Notes (Signed)
 INR-2.8 Please see anticoagulation encounter

## 2024-07-02 NOTE — Patient Instructions (Signed)
 Continue warfarin 1 tablet daily except 1/2 tablet on Sundays and Thursdays.  Recheck INR in 4 weeks.

## 2024-07-12 ENCOUNTER — Telehealth: Payer: Self-pay | Admitting: Physician Assistant

## 2024-07-12 NOTE — Telephone Encounter (Unsigned)
 Copied from CRM [id number]. Topic: Clinical - Medication Refill >> Jul 12, 2024  2:30 PM Tiffini S wrote: Medication: topiramate (TOPAMAX) 25 MG tablet  Has the patient contacted their pharmacy? Yes (Agent: If no, request that the patient contact the pharmacy for the refill. If patient does not wish to contact the pharmacy document the reason why and proceed with request.) (Agent: If yes, when and what did the pharmacy advise?)  This is the patient's preferred pharmacy:  [facility] [phone]  Is this the correct pharmacy for this prescription? Yes If no, delete pharmacy and type the correct one.   Has the prescription been filled recently? Yes  Is the patient out of the medication? No, have some tablets left   Has the patient been seen for an appointment in the last year OR does the patient have an upcoming appointment? Yes  Can we respond through MyChart? Yes  Agent: Please be advised that Rx refills may take up to 3 business days. We ask that you follow-up with your pharmacy.

## 2024-07-12 NOTE — Telephone Encounter (Signed)
 topiramate (TOPAMAX) 25 MG tablet not on current medication list.

## 2024-07-17 ENCOUNTER — Other Ambulatory Visit: Payer: Self-pay | Admitting: Medical Genetics

## 2024-07-17 DIAGNOSIS — Z006 Encounter for examination for normal comparison and control in clinical research program: Secondary | ICD-10-CM

## 2024-07-24 DIAGNOSIS — D631 Anemia in chronic kidney disease: Secondary | ICD-10-CM | POA: Diagnosis not present

## 2024-07-24 DIAGNOSIS — E211 Secondary hyperparathyroidism, not elsewhere classified: Secondary | ICD-10-CM | POA: Diagnosis not present

## 2024-07-24 DIAGNOSIS — E782 Mixed hyperlipidemia: Secondary | ICD-10-CM | POA: Diagnosis not present

## 2024-07-24 DIAGNOSIS — R809 Proteinuria, unspecified: Secondary | ICD-10-CM | POA: Diagnosis not present

## 2024-07-24 DIAGNOSIS — N189 Chronic kidney disease, unspecified: Secondary | ICD-10-CM | POA: Diagnosis not present

## 2024-07-25 ENCOUNTER — Ambulatory Visit: Payer: Self-pay | Admitting: Cardiovascular Disease

## 2024-07-25 LAB — LIPID PANEL
Chol/HDL Ratio: 2.7 ratio (ref 0.0–4.4)
Cholesterol, Total: 158 mg/dL (ref 100–199)
HDL: 58 mg/dL (ref >39)
LDL Chol Calc (NIH): 72 mg/dL (ref 0–99)
Triglycerides: 169 mg/dL — ABNORMAL HIGH (ref 0–149)
VLDL Cholesterol Cal: 28 mg/dL (ref 5–40)

## 2024-07-30 ENCOUNTER — Ambulatory Visit: Attending: Cardiovascular Disease | Admitting: *Deleted

## 2024-07-30 DIAGNOSIS — Z952 Presence of prosthetic heart valve: Secondary | ICD-10-CM | POA: Insufficient documentation

## 2024-07-30 DIAGNOSIS — Z5181 Encounter for therapeutic drug level monitoring: Secondary | ICD-10-CM | POA: Insufficient documentation

## 2024-07-30 LAB — POCT INR: INR: 1.9 — AB (ref 2.0–3.0)

## 2024-07-30 NOTE — Patient Instructions (Signed)
 Take warfarin 1 1/2 tablets tonight then resume 1 tablet daily except 1/2 tablet on Sundays and Thursdays.  Recheck INR in 4 weeks.

## 2024-07-30 NOTE — Progress Notes (Signed)
 INR 1.9; Please see anticoagulation encounter

## 2024-07-31 DIAGNOSIS — E1122 Type 2 diabetes mellitus with diabetic chronic kidney disease: Secondary | ICD-10-CM | POA: Diagnosis not present

## 2024-07-31 DIAGNOSIS — R809 Proteinuria, unspecified: Secondary | ICD-10-CM | POA: Diagnosis not present

## 2024-07-31 DIAGNOSIS — I129 Hypertensive chronic kidney disease with stage 1 through stage 4 chronic kidney disease, or unspecified chronic kidney disease: Secondary | ICD-10-CM | POA: Diagnosis not present

## 2024-07-31 DIAGNOSIS — N184 Chronic kidney disease, stage 4 (severe): Secondary | ICD-10-CM | POA: Diagnosis not present

## 2024-08-02 ENCOUNTER — Other Ambulatory Visit: Payer: Self-pay | Admitting: Physician Assistant

## 2024-08-02 NOTE — Telephone Encounter (Signed)
 Copied from CRM #[id number]. Topic: Clinical - Medication Refill >> Aug 02, 2024 12:51 PM Jasmin G wrote: Medication: zolpidem  (AMBIEN ) 10 MG tablet  Has the patient contacted their pharmacy? Yes (Agent: If no, request that the patient contact the pharmacy for the refill. If patient does not wish to contact the pharmacy document the reason why and proceed with request.) (Agent: If yes, when and what did the pharmacy advise?)  This is the patient's preferred pharmacy:  [id number] - [place], [place] - [address] [place] [address] Phone: [phone] Fax: [phone]  Is this the correct pharmacy for this prescription? Yes If no, delete pharmacy and type the correct one.   Has the prescription been filled recently? No  Is the patient out of the medication? Yes  Has the patient been seen for an appointment in the last year OR does the patient have an upcoming appointment? Yes  Can we respond through MyChart? No  Agent: Please be advised that Rx refills may take up to 3 business days. We ask that you follow-up with your pharmacy.

## 2024-08-03 DIAGNOSIS — M4316 Spondylolisthesis, lumbar region: Secondary | ICD-10-CM | POA: Diagnosis not present

## 2024-08-03 DIAGNOSIS — M4312 Spondylolisthesis, cervical region: Secondary | ICD-10-CM | POA: Diagnosis not present

## 2024-08-03 DIAGNOSIS — M25512 Pain in left shoulder: Secondary | ICD-10-CM | POA: Diagnosis not present

## 2024-08-03 DIAGNOSIS — Z6841 Body Mass Index (BMI) 40.0 and over, adult: Secondary | ICD-10-CM | POA: Diagnosis not present

## 2024-08-06 ENCOUNTER — Telehealth: Payer: Self-pay

## 2024-08-06 NOTE — Telephone Encounter (Unsigned)
 Copied from CRM #[id number]. Topic: Clinical - Medication Refill >> Aug 02, 2024 12:51 PM Jasmin G wrote: Medication: zolpidem  (AMBIEN ) 10 MG tablet  Has the patient contacted their pharmacy? Yes (Agent: If no, request that the patient contact the pharmacy for the refill. If patient does not wish to contact the pharmacy document the reason why and proceed with request.) (Agent: If yes, when and what did the pharmacy advise?)  This is the patient's preferred pharmacy:  [id number] - [place], [place] - [address] [address] [place] [address] Phone: [phone] Fax: [phone]  Is this the correct pharmacy for this prescription? Yes If no, delete pharmacy and type the correct one.   Has the prescription been filled recently? No  Is the patient out of the medication? Yes  Has the patient been seen for an appointment in the last year OR does the patient have an upcoming appointment? Yes  Can we respond through MyChart? No  Agent: Please be advised that Rx refills may take up to 3 business days. We ask that you follow-up with your pharmacy. >> Aug 06, 2024  2:09 PM Mia F wrote: Pt is calling to check the status of this recent rx refill request. Request shows it was denied because pt has not seen Charmaine Pontiff, NP yet. Pt would like to know if there is anything or anywhere she can go to get this rx filled. Please advise

## 2024-08-17 LAB — GENECONNECT MOLECULAR SCREEN: Genetic Analysis Overall Interpretation: NEGATIVE

## 2024-08-21 ENCOUNTER — Encounter

## 2024-08-21 DIAGNOSIS — G894 Chronic pain syndrome: Secondary | ICD-10-CM | POA: Diagnosis not present

## 2024-08-21 DIAGNOSIS — M5451 Vertebrogenic low back pain: Secondary | ICD-10-CM | POA: Diagnosis not present

## 2024-08-21 DIAGNOSIS — Z79891 Long term (current) use of opiate analgesic: Secondary | ICD-10-CM | POA: Diagnosis not present

## 2024-08-21 DIAGNOSIS — M461 Sacroiliitis, not elsewhere classified: Secondary | ICD-10-CM | POA: Diagnosis not present

## 2024-08-22 ENCOUNTER — Encounter: Payer: Self-pay | Admitting: Physician Assistant

## 2024-08-22 ENCOUNTER — Ambulatory Visit: Admitting: Physician Assistant

## 2024-08-22 VITALS — BP 139/72 | HR 70 | Temp 98.1°F | Ht 62.0 in | Wt 247.0 lb

## 2024-08-22 DIAGNOSIS — Z6841 Body Mass Index (BMI) 40.0 and over, adult: Secondary | ICD-10-CM

## 2024-08-22 DIAGNOSIS — F5101 Primary insomnia: Secondary | ICD-10-CM | POA: Diagnosis not present

## 2024-08-22 DIAGNOSIS — E1122 Type 2 diabetes mellitus with diabetic chronic kidney disease: Secondary | ICD-10-CM

## 2024-08-22 DIAGNOSIS — Z0001 Encounter for general adult medical examination with abnormal findings: Secondary | ICD-10-CM

## 2024-08-22 DIAGNOSIS — Z7984 Long term (current) use of oral hypoglycemic drugs: Secondary | ICD-10-CM

## 2024-08-22 DIAGNOSIS — Z7689 Persons encountering health services in other specified circumstances: Secondary | ICD-10-CM

## 2024-08-22 DIAGNOSIS — E1142 Type 2 diabetes mellitus with diabetic polyneuropathy: Secondary | ICD-10-CM | POA: Diagnosis not present

## 2024-08-22 DIAGNOSIS — E210 Primary hyperparathyroidism: Secondary | ICD-10-CM | POA: Insufficient documentation

## 2024-08-22 DIAGNOSIS — N1832 Chronic kidney disease, stage 3b: Secondary | ICD-10-CM | POA: Diagnosis not present

## 2024-08-22 DIAGNOSIS — Z0000 Encounter for general adult medical examination without abnormal findings: Secondary | ICD-10-CM

## 2024-08-22 MED ORDER — ZOLPIDEM TARTRATE 10 MG PO TABS: 10.0000 mg | ORAL_TABLET | Freq: Every day | ORAL | 2 refills | Status: AC

## 2024-08-22 NOTE — Progress Notes (Signed)
 Chief Complaint  Patient presents with   New Patient (Initial Visit)    Patient is a new patient. She mentioned she has had the shingles and wants to know about booster. Also wants to discuss medications     Subjective:   [name] is a 73 y.o. female who presents for a Medicare Annual Wellness Visit.  Discussed the use of AI scribe software for clinical note transcription with the patient, who gave verbal consent to proceed.  History of Present Illness [name] is a 73 year old female who presents for an annual physical exam.  Patient with past medical history significant for type 2 diabetes, hypertension, hyperlipidemia, insomnia, CKD, and s/p aortic mechanical valve replacement. She follows regularly with cardiology and nephrology.  She has macular degeneration and follows with a retina specialist every six months, with the last visit four months ago.  She recently had shingles on her back treated at urgent care with good response. She had received one prior Shingrix dose.  Her medications include Norco for pain, Ambien  for insomnia, allopurinol for gout, Zetia  and rosuvastatin  for hyperlipidemia, gabapentin for spine-related neuropathy, glimepiride  for diabetes, lisinopril  for hypertension, and Coumadin  for a mechanical heart valve. She stopped topiramate, which had been used for headaches and weight loss. She has been without Ambien  for two weeks and reports poor sleep.   Fall Screening Falls in the past year?: 0 Number of falls in past year: 0 Was there an injury with Fall?: 0 Fall Risk Category Calculator: 0 Patient Fall Risk Level: Low Fall Risk  Fall Risk Fall risk Follow up: Falls evaluation completed  Cognitive Assessment Was the patient able to repeat memory words in 3 tries?: yes Which version was used?: Version1 : banana, sunrise, chair Clock numbers correct?: yes Clock time correct (11:10)?: yes Normal clock drawing test?: 2 How many  words correct?: 3 Which version was used?: Version 1: banana, sunrise, chair Mini-Cog Scoring: 5  Advance Directives (For Healthcare) Does Patient Have a Medical Advance Directive?: Yes Does patient want to make changes to medical advance directive?: No - Guardian declined   Allergies (verified) Morphine and codeine   Current Medications (verified) Outpatient Encounter Medications as of 08/22/2024  Medication Sig   allopurinol (ZYLOPRIM) 100 MG tablet Take 100 mg by mouth daily.   b complex vitamins tablet Take 1 tablet by mouth daily.   Biotin 89999 MCG TABS Take 2,000 mg by mouth daily at 3 pm.   Blood Glucose Monitoring Suppl (ONE TOUCH ULTRA 2) w/Device KIT daily.   ezetimibe  (ZETIA ) 10 MG tablet Take 1 tablet (10 mg total) by mouth daily.   gabapentin (NEURONTIN) 300 MG capsule Take 300 mg by mouth daily.   glimepiride  (AMARYL ) 2 MG tablet Take 2 mg by mouth daily before breakfast.   HYDROcodone -acetaminophen  (NORCO/VICODIN) 5-325 MG tablet Take 1 tablet by mouth every 8 (eight) hours as needed.   lisinopril  (ZESTRIL ) 5 MG tablet Take 1 tablet (5 mg total) by mouth daily.   Multiple Vitamin (MULTIVITAMIN WITH MINERALS) TABS Take 1 tablet by mouth daily.   Omega-3 Fatty Acids (FISH OIL) 1000 MG CAPS Take 1,000 mg by mouth daily.   ONETOUCH ULTRA test strip daily in the afternoon.   rosuvastatin  (CRESTOR ) 10 MG tablet Take 1 tablet (10 mg total) by mouth daily.   warfarin (COUMADIN ) 5 MG tablet TAKE AS INSTRUCTED BY YOUR PRESCRIBER   [DISCONTINUED] zolpidem  (AMBIEN ) 10 MG tablet Take 10 mg by mouth at bedtime.  zolpidem  (AMBIEN ) 10 MG tablet Take 1 tablet (10 mg total) by mouth at bedtime.   [DISCONTINUED] co-enzyme Q-10 30 MG capsule Take 30 mg by mouth daily.   [DISCONTINUED] guaiFENesin (MUCINEX PO) Take 1 tablet by mouth in the morning and at bedtime.   [DISCONTINUED] Lancets (ONETOUCH DELICA PLUS LANCET33G) MISC Apply topically daily. (Patient not taking: Reported on  08/03/2022)   [DISCONTINUED] metoprolol  tartrate (LOPRESSOR ) 50 MG tablet TAKE 1/2 TABLET TWICE A DAY   [DISCONTINUED] Misc Natural Products (LUTEIN 20 PO) Take 20 mg by mouth daily at 6 (six) AM.   [DISCONTINUED] multivitamin-lutein (OCUVITE-LUTEIN) CAPS Take 1 capsule by mouth daily.   [DISCONTINUED] topiramate (TOPAMAX) 25 MG tablet Take 25 mg by mouth 2 (two) times daily.   No facility-administered encounter medications on file as of 08/22/2024.    History: Past Medical History:  Diagnosis Date   AI (aortic insufficiency)    Arthritis    AS (aortic stenosis) 09/18/2012   2D Echo EF 55%-60%   Chronic kidney disease    renal insuffcentily   Coronary artery disease    Cough    Diabetes mellitus (HCC)    Gout    right foot   Hyperlipidemia    Hypertension    dr lala   Obese    PONV (postoperative nausea and vomiting)    with early surgeries , none recent   Renal insufficiency    Tricuspid regurgitation    Past Surgical History:  Procedure Laterality Date   AORTIC VALVE REPLACEMENT  04/01/2010   21-mm Sr Jude Regent ;mechanical valve    BACK SURGERY     broken arm     wire in lt elbow   CARDIAC CATHETERIZATION  02/09/2010   FRACTURE SURGERY     GANGLION CYST EXCISION Left 01/08/2016   Procedure: LEFT WRIST EXCISION OF VOLAR CYST, EXCISION OF MUCOID TUMOR;  Surgeon: Arley Curia, MD;  Location: [facility];  Service: Orthopedics;  Laterality: Left;   LUMBAR LAMINECTOMY/DECOMPRESSION MICRODISCECTOMY N/A 02/28/2013   Procedure: LUMBAR LAMINECTOMY/DECOMPRESSION MICRODISCECTOMY 2 LEVELS;  Surgeon: Reyes JONETTA Budge, MD;  Location: [facility];  Service: Neurosurgery;  Laterality: N/A;  Lumbar three-four and Lumbar four-five laminectomies   MASS EXCISION Left 01/08/2016   Procedure: EXCISION MUCOID TUMOR LEFT INDEX FINGER DISTAL INTERPHALANGEAL JOINT;  Surgeon: Arley Curia, MD;  Location: [facility];  Service: Orthopedics;  Laterality: Left;    replacement  of ascending aortic aneurysm  04/01/2010   28-mm supracoronary Hemashield tube graft using deep hypothermic circulatory arrest   Family History  Problem Relation Age of Onset   Diabetes Mother    Cancer - Prostate Father    Breast cancer Sister 38   Cancer - Lung Maternal Grandfather    CAD Other    Diabetes Other    Hypertension Other    Social History   Occupational History   Not on file  Tobacco Use   Smoking status: Never   Smokeless tobacco: Never  Substance and Sexual Activity   Alcohol use: No   Drug use: No   Sexual activity: Not on file   Tobacco Counseling Counseling given: Not Answered  SDOH Screenings   Food Insecurity: No Food Insecurity (08/19/2024)  Housing: Unknown (08/19/2024)  Transportation Needs: Unmet Transportation Needs (08/19/2024)  Alcohol Screen: Low Risk  (08/19/2024)  Depression (PHQ2-9): Medium Risk (08/22/2024)  Financial Resource Strain: Low Risk  (08/19/2024)  Physical Activity: Unknown (08/19/2024)  Social Connections: Socially Isolated (08/19/2024)  Stress: Stress  Concern Present (08/19/2024)  Tobacco Use: Low Risk  (08/22/2024)   See flowsheets for full screening details  Depression Screen PHQ 2 & 9 Depression Scale- Over the past 2 weeks, how often have you been bothered by any of the following problems? Little interest or pleasure in doing things: 1 Feeling down, depressed, or hopeless (PHQ Adolescent also includes...irritable): 0 PHQ-2 Total Score: 1 Trouble falling or staying asleep, or sleeping too much: 3 Feeling tired or having little energy: 3 Poor appetite or overeating (PHQ Adolescent also includes...weight loss): 0 Feeling bad about yourself - or that you are a failure or have let yourself or your family down: 1 Trouble concentrating on things, such as reading the newspaper or watching television (PHQ Adolescent also includes...like school work): 0 Moving or speaking so slowly that other people could have  noticed. Or the opposite - being so fidgety or restless that you have been moving around a lot more than usual: 0 Thoughts that you would be better off dead, or of hurting yourself in some way: 0 PHQ-9 Total Score: 8 If you checked off any problems, how difficult have these problems made it for you to do your work, take care of things at home, or get along with other people?: Not difficult at all     Goals Addressed   None    Review of Systems  Constitutional:  Negative for appetite change, fatigue and fever.  Respiratory:  Negative for chest tightness and shortness of breath.   Cardiovascular:  Negative for chest pain and leg swelling.  Endocrine: Negative for polydipsia and polyuria.  Musculoskeletal:  Positive for back pain and gait problem. Negative for joint swelling and myalgias.  Skin:  Negative for rash.  Neurological:  Negative for dizziness, syncope and headaches.  Hematological:  Negative for adenopathy. Does not bruise/bleed easily.  Psychiatric/Behavioral:  The patient is not nervous/anxious.        Objective:    Today's Vitals   08/22/24 0819  BP: 139/72  Pulse: 70  Temp: 98.1 F (36.7 C)  SpO2: 98%  Weight: 247 lb (112 kg)  Height: 5' 2 (1.575 m)   Body mass index is 45.18 kg/m.  Physical Exam Constitutional:      General: She is not in acute distress.    Appearance: Normal appearance. She is obese. She is not ill-appearing.  HENT:     Head: Normocephalic and atraumatic.     Mouth/Throat:     Mouth: Mucous membranes are moist.     Pharynx: Oropharynx is clear.  Eyes:     Extraocular Movements: Extraocular movements intact.     Conjunctiva/sclera: Conjunctivae normal.  Cardiovascular:     Rate and Rhythm: Normal rate and regular rhythm.     Heart sounds: Normal heart sounds. No murmur heard. Pulmonary:     Effort: Pulmonary effort is normal.     Breath sounds: Normal breath sounds. No wheezing, rhonchi or rales.  Musculoskeletal:     Right lower  leg: No edema.     Left lower leg: No edema.  Skin:    General: Skin is warm and dry.  Neurological:     General: No focal deficit present.     Mental Status: She is alert and oriented to person, place, and time.  Psychiatric:        Mood and Affect: Mood normal.        Behavior: Behavior normal.     Hearing/Vision screen No results found. Immunizations and Health Maintenance  Health Maintenance  Topic Date Due   COVID-19 Vaccine (1) Never done   Diabetic kidney evaluation - Urine ACR  Never done   Hepatitis C Screening  Never done   Pneumococcal Vaccine: 50+ Years (1 of 2 - PCV) Never done   Zoster Vaccines- Shingrix (1 of 2) Never done   HEMOGLOBIN A1C  11/22/2016   Diabetic kidney evaluation - eGFR measurement  07/31/2020   DTaP/Tdap/Td (2 - Td or Tdap) 09/17/2024   OPHTHALMOLOGY EXAM  05/02/2025   FOOT EXAM  08/22/2025   Medicare Annual Wellness (AWV)  08/22/2025   Colonoscopy  05/25/2031   Influenza Vaccine  Completed   Bone Density Scan  Completed   Meningococcal B Vaccine  Aged Out   Mammogram  Discontinued        Assessment/Plan:  This is a routine wellness examination for [place].  Encounter to establish care  Encounter for Medicare annual wellness exam  Type 2 diabetes mellitus with diabetic polyneuropathy, without long-term current use of insulin  (HCC) Assessment & Plan: Stable.  Continue current management.  Patient is on an ACEI and statin. Lipid panel checked less than a year ago. Microalbumin and A1c today.  Increase dietary efforts and physical activity. Routine diabetic retinopathy screening: up-to-date. Foot exam and monofilament test done.   Orders: -     Hemoglobin A1c -     Comprehensive metabolic panel with GFR -     CBC with Differential/Platelet -     Lipid panel -     Microalbumin / creatinine urine ratio  Stage 3b chronic kidney disease (HCC) Assessment & Plan: Stable. Follows regularly with nephrology, encouraged patient to  continue regular follow ups.    Primary insomnia -     Zolpidem  Tartrate; Take 1 tablet (10 mg total) by mouth at bedtime.  Dispense: 30 tablet; Refill: 2    Patient Care Team: Deeann Servidio, [place], [place] as PCP - General (Physician Assistant) Croitoru, Jerel, [place] as PCP - Cardiology (Cardiology)  I have personally reviewed and noted the following in the patient's chart:   Medical and social history Use of alcohol, tobacco or illicit drugs  Current medications and supplements including opioid prescriptions. Functional ability and status Nutritional status Physical activity Advanced directives List of other physicians Hospitalizations, surgeries, and ER visits in previous 12 months Vitals Screenings to include cognitive, depression, and falls Referrals and appointments   Orders Placed This Encounter  Procedures   Cologuard    This external order was created through the Results Console.   Hemoglobin A1c   Comprehensive metabolic panel with GFR   CBC with Differential/Platelet   Lipid panel   Microalbumin / creatinine urine ratio   In addition, I have reviewed and discussed with patient certain preventive protocols, quality metrics, and best practice recommendations. A written personalized care plan for preventive services as well as general preventive health recommendations were provided to patient.   Charmaine Jerilynn Feldmeier, PA-C   08/22/2024   Return in 6 months (on 02/20/2025).  After Visit Summary: (In Person-Printed) AVS printed and given to the patient

## 2024-08-22 NOTE — Assessment & Plan Note (Signed)
 Stable.  Continue current management.  Patient is on an ACEI and statin. Lipid panel checked less than a year ago. Microalbumin and A1c today.  Increase dietary efforts and physical activity. Routine diabetic retinopathy screening: up-to-date. Foot exam and monofilament test done.

## 2024-08-22 NOTE — Assessment & Plan Note (Signed)
 Stable. Follows regularly with nephrology, encouraged patient to continue regular follow ups.

## 2024-08-23 ENCOUNTER — Encounter (HOSPITAL_COMMUNITY): Payer: Self-pay | Admitting: Nephrology

## 2024-08-23 ENCOUNTER — Other Ambulatory Visit (HOSPITAL_COMMUNITY): Payer: Self-pay | Admitting: Nephrology

## 2024-08-23 DIAGNOSIS — E213 Hyperparathyroidism, unspecified: Secondary | ICD-10-CM

## 2024-08-23 LAB — LIPID PANEL

## 2024-08-25 LAB — MICROALBUMIN / CREATININE URINE RATIO

## 2024-08-26 LAB — CBC WITH DIFFERENTIAL/PLATELET
Basophils Absolute: 0.1 x10E3/uL (ref 0.0–0.2)
Basos: 1 %
EOS (ABSOLUTE): 0.3 x10E3/uL (ref 0.0–0.4)
Eos: 3 %
Hematocrit: 41 % (ref 34.0–46.6)
Hemoglobin: 13.1 g/dL (ref 11.1–15.9)
Immature Grans (Abs): 0 x10E3/uL (ref 0.0–0.1)
Immature Granulocytes: 0 %
Lymphocytes Absolute: 2.3 x10E3/uL (ref 0.7–3.1)
Lymphs: 25 %
MCH: 31.6 pg (ref 26.6–33.0)
MCHC: 32 g/dL (ref 31.5–35.7)
MCV: 99 fL — ABNORMAL HIGH (ref 79–97)
Monocytes Absolute: 0.7 x10E3/uL (ref 0.1–0.9)
Monocytes: 8 %
Neutrophils Absolute: 5.8 x10E3/uL (ref 1.4–7.0)
Neutrophils: 63 %
Platelets: 227 x10E3/uL (ref 150–450)
RBC: 4.15 x10E6/uL (ref 3.77–5.28)
RDW: 13.6 % (ref 11.7–15.4)
WBC: 9.3 x10E3/uL (ref 3.4–10.8)

## 2024-08-26 LAB — LIPID PANEL
Cholesterol, Total: 159 mg/dL (ref 100–199)
HDL: 68 mg/dL (ref >39)
LDL CALC COMMENT:: 2.3 ratio (ref 0.0–4.4)
LDL Chol Calc (NIH): 67 mg/dL (ref 0–99)
Triglycerides: 143 mg/dL (ref 0–149)
VLDL Cholesterol Cal: 24 mg/dL (ref 5–40)

## 2024-08-26 LAB — MICROALBUMIN / CREATININE URINE RATIO

## 2024-08-26 LAB — HEMOGLOBIN A1C
Est. average glucose Bld gHb Est-mCnc: 140 mg/dL
Hgb A1c MFr Bld: 6.5 % — AB (ref 4.8–5.6)

## 2024-08-26 LAB — SPECIMEN STATUS REPORT

## 2024-08-27 ENCOUNTER — Ambulatory Visit: Attending: Cardiovascular Disease

## 2024-08-27 DIAGNOSIS — Z5181 Encounter for therapeutic drug level monitoring: Secondary | ICD-10-CM | POA: Diagnosis not present

## 2024-08-27 DIAGNOSIS — Z952 Presence of prosthetic heart valve: Secondary | ICD-10-CM | POA: Diagnosis not present

## 2024-08-27 LAB — POCT INR: INR: 5.4 — AB (ref 2.0–3.0)

## 2024-08-27 NOTE — Progress Notes (Signed)
 INR 5.4 Please see anticoagulation encounter

## 2024-08-27 NOTE — Patient Instructions (Addendum)
 Hold warfarin today and tomorrow then resume 1 tablet daily except 1/2 tablet on Sundays and Thursdays.  Recheck INR in 2 weeks.  Pt denies S/S of bleeding or excessive bruising.  Bleeding and fall precautions discussed with pt and she verbalized understanding.

## 2024-08-29 ENCOUNTER — Ambulatory Visit: Payer: Self-pay | Admitting: Physician Assistant

## 2024-09-03 DIAGNOSIS — G8929 Other chronic pain: Secondary | ICD-10-CM | POA: Diagnosis not present

## 2024-09-03 DIAGNOSIS — M170 Bilateral primary osteoarthritis of knee: Secondary | ICD-10-CM | POA: Diagnosis not present

## 2024-09-03 DIAGNOSIS — M25512 Pain in left shoulder: Secondary | ICD-10-CM | POA: Diagnosis not present

## 2024-09-10 ENCOUNTER — Encounter (HOSPITAL_COMMUNITY)
Admission: RE | Admit: 2024-09-10 | Discharge: 2024-09-10 | Disposition: A | Discharge status: Home / Self Care | Source: Ambulatory Visit | Attending: Nephrology | Admitting: Nephrology

## 2024-09-10 DIAGNOSIS — E213 Hyperparathyroidism, unspecified: Secondary | ICD-10-CM | POA: Diagnosis present

## 2024-09-10 MED ORDER — TECHNETIUM TC 99M SESTAMIBI - CARDIOLITE
25.0000 | Freq: Once | INTRAVENOUS | Status: AC | PRN
  Administered 2024-09-10: 27 via INTRAVENOUS

## 2024-09-11 ENCOUNTER — Ambulatory Visit: Attending: Cardiovascular Disease | Admitting: *Deleted

## 2024-09-11 DIAGNOSIS — Z952 Presence of prosthetic heart valve: Secondary | ICD-10-CM | POA: Diagnosis present

## 2024-09-11 DIAGNOSIS — Z5181 Encounter for therapeutic drug level monitoring: Secondary | ICD-10-CM | POA: Insufficient documentation

## 2024-09-11 LAB — POCT INR: INR: 3.2 — AB (ref 2.0–3.0)

## 2024-09-11 NOTE — Progress Notes (Signed)
 INR 3.2 Please see anticoagulation encounter

## 2024-09-11 NOTE — Patient Instructions (Signed)
 Decrease warfarin to 1 tablet daily except 1/2 tablet on Sundays, Tuesdays and Thursdays.  Recheck INR in 3 weeks.

## 2024-09-25 ENCOUNTER — Ambulatory Visit
Admission: RE | Admit: 2024-09-25 | Discharge: 2024-09-25 | Disposition: A | Source: Ambulatory Visit | Attending: Physician Assistant | Admitting: Physician Assistant

## 2024-09-25 DIAGNOSIS — Z1231 Encounter for screening mammogram for malignant neoplasm of breast: Secondary | ICD-10-CM

## 2024-09-27 ENCOUNTER — Other Ambulatory Visit: Payer: Self-pay | Admitting: Cardiovascular Disease

## 2024-09-27 DIAGNOSIS — Z952 Presence of prosthetic heart valve: Secondary | ICD-10-CM

## 2024-09-30 ENCOUNTER — Ambulatory Visit: Payer: Self-pay | Admitting: Physician Assistant

## 2024-10-02 ENCOUNTER — Ambulatory Visit

## 2024-10-04 ENCOUNTER — Other Ambulatory Visit: Payer: Self-pay | Admitting: Physician Assistant

## 2024-10-04 NOTE — Telephone Encounter (Unsigned)
 Copied from CRM [id number]. Topic: Clinical - Medication Refill >> Oct 04, 2024  2:56 PM Zebedee SAUNDERS wrote: Medication: glimepiride  (AMARYL ) 2 MG tablet, allopurinol  (ZYLOPRIM ) 100 MG tablet,  gabapentin  (NEURONTIN ) 300 MG capsule,  Has the patient contacted their pharmacy? Yes (Agent: If no, request that the patient contact the pharmacy for the refill. If patient does not wish to contact the pharmacy document the reason why and proceed with request.) (Agent: If yes, when and what did the pharmacy advise?)Pharmacy need scripts  This is the patient's preferred pharmacy:    [facility] - [place], [place] - [address] [place] [address] Phone: [phone] Fax: [phone]  Is this the correct pharmacy for this prescription? Yes If no, delete pharmacy and type the correct one.   Has the prescription been filled recently? Yes  Is the patient out of the medication? Yes  Has the patient been seen for an appointment in the last year OR does the patient have an upcoming appointment? Yes  Can we respond through MyChart? Yes  Agent: Please be advised that Rx refills may take up to 3 business days. We ask that you follow-up with your pharmacy.

## 2024-10-05 MED ORDER — GABAPENTIN 300 MG PO CAPS: 300.0000 mg | ORAL_CAPSULE | Freq: Every day | ORAL | 1 refills | Status: AC

## 2024-10-05 MED ORDER — GLIMEPIRIDE 2 MG PO TABS: 2.0000 mg | ORAL_TABLET | Freq: Every day | ORAL | 1 refills | Status: AC

## 2024-10-05 MED ORDER — ALLOPURINOL 100 MG PO TABS: 100.0000 mg | ORAL_TABLET | Freq: Every day | ORAL | 1 refills | Status: AC

## 2024-10-08 ENCOUNTER — Ambulatory Visit

## 2024-10-18 ENCOUNTER — Ambulatory Visit

## 2024-10-23 ENCOUNTER — Ambulatory Visit

## 2024-10-30 ENCOUNTER — Ambulatory Visit

## 2025-02-20 ENCOUNTER — Ambulatory Visit: Admitting: Physician Assistant
